# Patient Record
Sex: Female | Born: 1935
Health system: Southern US, Community
[De-identification: ages and names within clinical notes are randomized; demographics above are authoritative.]

## PROBLEM LIST (undated history)

## (undated) DIAGNOSIS — H9190 Unspecified hearing loss, unspecified ear: Secondary | ICD-10-CM

## (undated) DIAGNOSIS — K219 Gastro-esophageal reflux disease without esophagitis: Secondary | ICD-10-CM

## (undated) DIAGNOSIS — D649 Anemia, unspecified: Secondary | ICD-10-CM

## (undated) DIAGNOSIS — J45909 Unspecified asthma, uncomplicated: Secondary | ICD-10-CM

## (undated) DIAGNOSIS — E669 Obesity, unspecified: Secondary | ICD-10-CM

## (undated) DIAGNOSIS — I4891 Unspecified atrial fibrillation: Secondary | ICD-10-CM

## (undated) DIAGNOSIS — K635 Polyp of colon: Secondary | ICD-10-CM

## (undated) DIAGNOSIS — Z950 Presence of cardiac pacemaker: Secondary | ICD-10-CM

## (undated) DIAGNOSIS — I10 Essential (primary) hypertension: Secondary | ICD-10-CM

## (undated) DIAGNOSIS — E119 Type 2 diabetes mellitus without complications: Secondary | ICD-10-CM

## (undated) DIAGNOSIS — I499 Cardiac arrhythmia, unspecified: Secondary | ICD-10-CM

## (undated) DIAGNOSIS — I058 Other rheumatic mitral valve diseases: Secondary | ICD-10-CM

## (undated) DIAGNOSIS — E785 Hyperlipidemia, unspecified: Secondary | ICD-10-CM

## (undated) HISTORY — DX: Hyperlipidemia, unspecified: E78.5

## (undated) HISTORY — PX: OTHER SURGICAL HISTORY: SHX169

## (undated) HISTORY — PX: TONSILLECTOMY: SUR1361

## (undated) HISTORY — DX: Unspecified atrial fibrillation: I48.91

## (undated) HISTORY — DX: Essential (primary) hypertension: I10

## (undated) HISTORY — DX: Type 2 diabetes mellitus without complications: E11.9

## (undated) HISTORY — PX: APPENDECTOMY: SHX54

## (undated) HISTORY — PX: ABDOMINAL HYSTERECTOMY: SHX81

## (undated) HISTORY — DX: Polyp of colon: K63.5

## (undated) HISTORY — DX: Anemia, unspecified: D64.9

## (undated) HISTORY — DX: Obesity, unspecified: E66.9

## (undated) HISTORY — PX: COLONOSCOPY: SHX174

## (undated) HISTORY — PX: INSERT / REPLACE / REMOVE PACEMAKER: SUR710

## (undated) HISTORY — DX: Other rheumatic mitral valve diseases: I05.8

---

## 1999-12-08 LAB — HM PAP SMEAR: HM PAP: NORMAL

## 2003-02-17 DIAGNOSIS — K635 Polyp of colon: Secondary | ICD-10-CM

## 2003-02-17 DIAGNOSIS — E119 Type 2 diabetes mellitus without complications: Secondary | ICD-10-CM

## 2003-02-17 HISTORY — DX: Polyp of colon: K63.5

## 2003-02-17 HISTORY — DX: Type 2 diabetes mellitus without complications: E11.9

## 2004-02-17 HISTORY — PX: FLEXIBLE SIGMOIDOSCOPY: SHX1649

## 2004-03-31 ENCOUNTER — Ambulatory Visit: Payer: Self-pay | Admitting: Family Medicine

## 2004-04-16 ENCOUNTER — Ambulatory Visit: Payer: Self-pay | Admitting: Family Medicine

## 2004-12-23 ENCOUNTER — Ambulatory Visit: Payer: Self-pay | Admitting: Family Medicine

## 2006-02-04 ENCOUNTER — Ambulatory Visit: Payer: Self-pay | Admitting: Family Medicine

## 2006-02-16 HISTORY — PX: PACEMAKER PLACEMENT: SHX43

## 2006-02-16 HISTORY — PX: CHOLECYSTECTOMY: SHX55

## 2006-07-27 ENCOUNTER — Ambulatory Visit: Payer: Self-pay | Admitting: Orthopedic Surgery

## 2006-08-03 ENCOUNTER — Ambulatory Visit: Payer: Self-pay | Admitting: Orthopedic Surgery

## 2007-02-13 ENCOUNTER — Other Ambulatory Visit: Payer: Self-pay

## 2007-02-14 ENCOUNTER — Other Ambulatory Visit: Payer: Self-pay

## 2007-02-14 ENCOUNTER — Inpatient Hospital Stay: Payer: Self-pay | Admitting: General Surgery

## 2007-04-05 ENCOUNTER — Ambulatory Visit: Payer: Self-pay | Admitting: Family Medicine

## 2007-08-30 ENCOUNTER — Ambulatory Visit: Payer: Self-pay | Admitting: General Surgery

## 2008-05-30 ENCOUNTER — Ambulatory Visit: Payer: Self-pay | Admitting: Family Medicine

## 2009-07-09 ENCOUNTER — Ambulatory Visit: Payer: Self-pay | Admitting: Family Medicine

## 2010-11-17 ENCOUNTER — Ambulatory Visit: Payer: Self-pay | Admitting: Family Medicine

## 2011-05-19 DIAGNOSIS — R9431 Abnormal electrocardiogram [ECG] [EKG]: Secondary | ICD-10-CM | POA: Diagnosis not present

## 2011-05-19 DIAGNOSIS — E119 Type 2 diabetes mellitus without complications: Secondary | ICD-10-CM | POA: Diagnosis not present

## 2011-05-19 DIAGNOSIS — E785 Hyperlipidemia, unspecified: Secondary | ICD-10-CM | POA: Diagnosis not present

## 2011-05-19 DIAGNOSIS — I10 Essential (primary) hypertension: Secondary | ICD-10-CM | POA: Diagnosis not present

## 2011-06-30 DIAGNOSIS — E119 Type 2 diabetes mellitus without complications: Secondary | ICD-10-CM | POA: Diagnosis not present

## 2011-06-30 DIAGNOSIS — I4891 Unspecified atrial fibrillation: Secondary | ICD-10-CM | POA: Diagnosis not present

## 2011-06-30 DIAGNOSIS — I495 Sick sinus syndrome: Secondary | ICD-10-CM | POA: Diagnosis not present

## 2011-07-09 DIAGNOSIS — L570 Actinic keratosis: Secondary | ICD-10-CM | POA: Diagnosis not present

## 2011-07-09 DIAGNOSIS — L821 Other seborrheic keratosis: Secondary | ICD-10-CM | POA: Diagnosis not present

## 2011-07-27 DIAGNOSIS — I4891 Unspecified atrial fibrillation: Secondary | ICD-10-CM | POA: Diagnosis not present

## 2011-07-27 DIAGNOSIS — E119 Type 2 diabetes mellitus without complications: Secondary | ICD-10-CM | POA: Diagnosis not present

## 2011-07-27 DIAGNOSIS — I10 Essential (primary) hypertension: Secondary | ICD-10-CM | POA: Diagnosis not present

## 2011-07-27 DIAGNOSIS — Z950 Presence of cardiac pacemaker: Secondary | ICD-10-CM | POA: Diagnosis not present

## 2011-09-17 DIAGNOSIS — R9431 Abnormal electrocardiogram [ECG] [EKG]: Secondary | ICD-10-CM | POA: Diagnosis not present

## 2011-09-17 DIAGNOSIS — E785 Hyperlipidemia, unspecified: Secondary | ICD-10-CM | POA: Diagnosis not present

## 2011-09-17 DIAGNOSIS — I4891 Unspecified atrial fibrillation: Secondary | ICD-10-CM | POA: Diagnosis not present

## 2011-09-17 DIAGNOSIS — E119 Type 2 diabetes mellitus without complications: Secondary | ICD-10-CM | POA: Diagnosis not present

## 2011-09-22 LAB — HM DEXA SCAN: HM Dexa Scan: NORMAL

## 2011-10-27 DIAGNOSIS — M810 Age-related osteoporosis without current pathological fracture: Secondary | ICD-10-CM | POA: Diagnosis not present

## 2011-11-17 DIAGNOSIS — Z23 Encounter for immunization: Secondary | ICD-10-CM | POA: Diagnosis not present

## 2011-12-28 DIAGNOSIS — R0602 Shortness of breath: Secondary | ICD-10-CM | POA: Diagnosis not present

## 2011-12-28 DIAGNOSIS — I059 Rheumatic mitral valve disease, unspecified: Secondary | ICD-10-CM | POA: Diagnosis not present

## 2011-12-28 DIAGNOSIS — I119 Hypertensive heart disease without heart failure: Secondary | ICD-10-CM | POA: Diagnosis not present

## 2011-12-28 DIAGNOSIS — I4891 Unspecified atrial fibrillation: Secondary | ICD-10-CM | POA: Diagnosis not present

## 2012-01-05 DIAGNOSIS — E785 Hyperlipidemia, unspecified: Secondary | ICD-10-CM | POA: Diagnosis not present

## 2012-01-05 DIAGNOSIS — Z0000 Encounter for general adult medical examination without abnormal findings: Secondary | ICD-10-CM | POA: Diagnosis not present

## 2012-01-05 DIAGNOSIS — R9431 Abnormal electrocardiogram [ECG] [EKG]: Secondary | ICD-10-CM | POA: Diagnosis not present

## 2012-01-05 DIAGNOSIS — Z1339 Encounter for screening examination for other mental health and behavioral disorders: Secondary | ICD-10-CM | POA: Diagnosis not present

## 2012-01-07 DIAGNOSIS — D1801 Hemangioma of skin and subcutaneous tissue: Secondary | ICD-10-CM | POA: Diagnosis not present

## 2012-01-07 DIAGNOSIS — L821 Other seborrheic keratosis: Secondary | ICD-10-CM | POA: Diagnosis not present

## 2012-01-11 DIAGNOSIS — I4891 Unspecified atrial fibrillation: Secondary | ICD-10-CM | POA: Diagnosis not present

## 2012-01-11 DIAGNOSIS — R0602 Shortness of breath: Secondary | ICD-10-CM | POA: Diagnosis not present

## 2012-02-08 DIAGNOSIS — D649 Anemia, unspecified: Secondary | ICD-10-CM | POA: Diagnosis not present

## 2012-02-08 DIAGNOSIS — I10 Essential (primary) hypertension: Secondary | ICD-10-CM | POA: Diagnosis not present

## 2012-02-08 DIAGNOSIS — E7800 Pure hypercholesterolemia, unspecified: Secondary | ICD-10-CM | POA: Diagnosis not present

## 2012-02-08 DIAGNOSIS — E119 Type 2 diabetes mellitus without complications: Secondary | ICD-10-CM | POA: Diagnosis not present

## 2012-02-15 DIAGNOSIS — R5383 Other fatigue: Secondary | ICD-10-CM | POA: Diagnosis not present

## 2012-02-15 DIAGNOSIS — E785 Hyperlipidemia, unspecified: Secondary | ICD-10-CM | POA: Diagnosis not present

## 2012-02-15 DIAGNOSIS — E119 Type 2 diabetes mellitus without complications: Secondary | ICD-10-CM | POA: Diagnosis not present

## 2012-02-15 DIAGNOSIS — I10 Essential (primary) hypertension: Secondary | ICD-10-CM | POA: Diagnosis not present

## 2012-02-24 ENCOUNTER — Ambulatory Visit: Payer: Self-pay | Admitting: Family Medicine

## 2012-02-24 DIAGNOSIS — Z1231 Encounter for screening mammogram for malignant neoplasm of breast: Secondary | ICD-10-CM | POA: Diagnosis not present

## 2012-02-24 DIAGNOSIS — R928 Other abnormal and inconclusive findings on diagnostic imaging of breast: Secondary | ICD-10-CM | POA: Diagnosis not present

## 2012-06-29 DIAGNOSIS — I10 Essential (primary) hypertension: Secondary | ICD-10-CM | POA: Diagnosis not present

## 2012-06-29 DIAGNOSIS — Z950 Presence of cardiac pacemaker: Secondary | ICD-10-CM | POA: Diagnosis not present

## 2012-06-29 DIAGNOSIS — I4891 Unspecified atrial fibrillation: Secondary | ICD-10-CM | POA: Diagnosis not present

## 2012-06-29 DIAGNOSIS — E785 Hyperlipidemia, unspecified: Secondary | ICD-10-CM | POA: Diagnosis not present

## 2012-07-14 DIAGNOSIS — I495 Sick sinus syndrome: Secondary | ICD-10-CM | POA: Diagnosis not present

## 2012-08-08 DIAGNOSIS — E119 Type 2 diabetes mellitus without complications: Secondary | ICD-10-CM | POA: Diagnosis not present

## 2012-08-08 DIAGNOSIS — I10 Essential (primary) hypertension: Secondary | ICD-10-CM | POA: Diagnosis not present

## 2012-08-08 DIAGNOSIS — I4891 Unspecified atrial fibrillation: Secondary | ICD-10-CM | POA: Diagnosis not present

## 2012-08-08 DIAGNOSIS — E7800 Pure hypercholesterolemia, unspecified: Secondary | ICD-10-CM | POA: Diagnosis not present

## 2012-08-22 ENCOUNTER — Encounter: Payer: Self-pay | Admitting: *Deleted

## 2012-08-29 ENCOUNTER — Ambulatory Visit (INDEPENDENT_AMBULATORY_CARE_PROVIDER_SITE_OTHER): Payer: Medicare Other | Admitting: General Surgery

## 2012-08-29 ENCOUNTER — Encounter: Payer: Self-pay | Admitting: General Surgery

## 2012-08-29 VITALS — BP 132/72 | HR 80 | Resp 14 | Ht 63.0 in | Wt 175.0 lb

## 2012-08-29 DIAGNOSIS — K635 Polyp of colon: Secondary | ICD-10-CM | POA: Insufficient documentation

## 2012-08-29 DIAGNOSIS — Z860100 Personal history of colon polyps, unspecified: Secondary | ICD-10-CM | POA: Insufficient documentation

## 2012-08-29 NOTE — Progress Notes (Signed)
Patient ID: Taylor Snyder, female   DOB: 1928-02-25, 79 y.o.   MRN: [id number]  Chief Complaint  Patient presents with  . Other    colonoscopy    HPI Taylor Snyder is a 79 y.o. female who presents for a colonoscopy. The patient has a history of colon polyps. The most recent colonoscopy was done in 2005. No current problems with the bowel at this time. HPI  Past Medical History  Diagnosis Date  . Diabetes mellitus without complication 2005  . Colon polyps 2005  . A-fib     Past Surgical History  Procedure Laterality Date  . Flexible sigmoidoscopy  2006  . Cholecystectomy  2008  . Colonoscopy  2005  . Abdominal hysterectomy    . Salpingo oophorectmy     . Tonsillectomy    . Pacemaker placement  2008    History reviewed. No pertinent family history.  Social History History  Substance Use Topics  . Smoking status: Never Smoker   . Smokeless tobacco: Not on file  . Alcohol Use: No    Allergies  Allergen Reactions  . Codeine Other (See Comments)    Unable to sleep, hypes her up    Current Outpatient Prescriptions  Medication Sig Dispense Refill  . Calcium-Vitamin D (CALTRATE 600 PLUS-VIT D PO) Take 1 tablet by mouth daily.      . enalapril (VASOTEC) 5 MG tablet Take 5 mg by mouth 2 (two) times daily.      . fluticasone (FLOVENT DISKUS) 50 MCG/BLIST diskus inhaler Inhale 1 puff into the lungs daily.      . metFORMIN (GLUCOPHAGE) 500 MG tablet Take 500 mg by mouth 2 (two) times daily with a meal.      . mometasone-formoterol (DULERA) 200-5 MCG/ACT AERO Inhale 2 puffs into the lungs daily as needed for wheezing.      . Multiple Vitamin (MULTIVITAMIN) tablet Take 1 tablet by mouth daily.      . pravastatin (PRAVACHOL) 40 MG tablet Take 40 mg by mouth daily.      . raloxifene (EVISTA) 60 MG tablet Take 60 mg by mouth daily.      . Rivaroxaban (XARELTO) 20 MG TABS Take 20 mg by mouth daily.       No current facility-administered medications for this visit.    Review of  Systems Review of Systems  Constitutional: Negative.   Respiratory: Negative.   Cardiovascular: Negative.   Gastrointestinal: Negative.     Blood pressure 132/72, pulse 80, resp. rate 14, height 5\' 3"  (1.6 m), weight 175 lb (79.379 kg).  Physical Exam Physical Exam  Constitutional: She is oriented to person, place, and time. She appears well-developed and well-nourished.  Cardiovascular: An irregular rhythm present.  No murmur heard. Pulmonary/Chest: Effort normal and breath sounds normal.  Neurological: She is alert and oriented to person, place, and time.  Skin: Skin is warm and dry.    Data Reviewed Colonoscopy dated August 07, 2003 showed 212 mm polyps in the rectum.  Pathology showed a tubular adenoma in the mid rectal polyp and a tubulovillous adenoma with focal severe dysplasia in the lower rectal lesion.  Multiple small mouth diverticula were identified in the descending colon.  Assessment    Previous colonic polyps, one with severe dysplasia.     Plan    The indications for repeat examination has been discussed. She will need to hold her Xarelto prior to the procedure.     Patient will be contacted to arrange colonoscopy once September  2014 schedule is available. This patient has been asked to discontinue Xarelto two days prior to procedure. She has also been asked to hold metformin day of colonoscopy prep and procedure. Miralax prescription will be sent to patient's pharmacy once date is arranged.   Please note: patient is requesting colonoscopy be completed on a Wednesday morning since she is diabetic.   Taylor Snyder 08/30/2012, 9:30 PM

## 2012-08-29 NOTE — Patient Instructions (Addendum)
Colonoscopy A colonoscopy is an exam to evaluate your entire colon. In this exam, your colon is cleansed. A long fiberoptic tube is inserted through your rectum and into your colon. The fiberoptic scope (endoscope) is a long bundle of enclosed and very flexible fibers. These fibers transmit light to the area examined and send images from that area to your caregiver. Discomfort is usually minimal. You may be given a drug to help you sleep (sedative) during or prior to the procedure. This exam helps to detect lumps (tumors), polyps, inflammation, and areas of bleeding. Your caregiver may also take a small piece of tissue (biopsy) that will be examined under a microscope. LET YOUR CAREGIVER KNOW ABOUT:   Allergies to food or medicine.  Medicines taken, including vitamins, herbs, eyedrops, over-the-counter medicines, and creams.  Use of steroids (by mouth or creams).  Previous problems with anesthetics or numbing medicines.  History of bleeding problems or blood clots.  Previous surgery.  Other health problems, including diabetes and kidney problems.  Possibility of pregnancy, if this applies. BEFORE THE PROCEDURE   A clear liquid diet may be required for 2 days before the exam.  Ask your caregiver about changing or stopping your regular medications.  Liquid injections (enemas) or laxatives may be required.  A large amount of electrolyte solution may be given to you to drink over a short period of time. This solution is used to clean out your colon.  You should be present 60 minutes prior to your procedure or as directed by your caregiver. AFTER THE PROCEDURE   If you received a sedative or pain relieving medication, you will need to arrange for someone to drive you home.  Occasionally, there is a little blood passed with the first bowel movement. Do not be concerned. FINDING OUT THE RESULTS OF YOUR TEST Not all test results are available during your visit. If your test results are  not back during the visit, make an appointment with your caregiver to find out the results. Do not assume everything is normal if you have not heard from your caregiver or the medical facility. It is important for you to follow up on all of your test results. HOME CARE INSTRUCTIONS   It is not unusual to pass moderate amounts of gas and experience mild abdominal cramping following the procedure. This is due to air being used to inflate your colon during the exam. Walking or a warm pack on your belly (abdomen) may help.  You may resume all normal meals and activities after sedatives and medicines have worn off.  Only take over-the-counter or prescription medicines for pain, discomfort, or fever as directed by your caregiver. Do not use aspirin or blood thinners if a biopsy was taken. Consult your caregiver for medicine usage if biopsies were taken. SEEK IMMEDIATE MEDICAL CARE IF:   You have a fever.  You pass large blood clots or fill a toilet with blood following the procedure. This may also occur 10 to 14 days following the procedure. This is more likely if a biopsy was taken.  You develop abdominal pain that keeps getting worse and cannot be relieved with medicine. Document Released: 01/31/2000 Document Revised: 04/27/2011 Document Reviewed: 09/15/2007 [facility] Patient Information 2014 [place], [place].  Patient will be contacted to arrange colonoscopy once September 2014 schedule is available. This patient has been asked to discontinue Xarelto two days prior to procedure. She has also been asked to hold metformin day of colonoscopy prep and procedure.

## 2012-08-30 ENCOUNTER — Encounter: Payer: Self-pay | Admitting: General Surgery

## 2012-09-15 ENCOUNTER — Telehealth: Payer: Self-pay | Admitting: *Deleted

## 2012-09-15 NOTE — Telephone Encounter (Signed)
Message left on home and cell numbers to call the office.   We need to arrange colonoscopy since September 2014 schedule is now available.

## 2012-09-20 ENCOUNTER — Telehealth: Payer: Self-pay | Admitting: *Deleted

## 2012-09-20 DIAGNOSIS — Z86010 Personal history of colonic polyps: Secondary | ICD-10-CM

## 2012-09-20 MED ORDER — POLYETHYLENE GLYCOL 3350 17 GM/SCOOP PO POWD: ORAL | Status: DC

## 2012-09-20 NOTE — Telephone Encounter (Signed)
Patient called back to arrange a colonoscopy at [facility] for 11-16-12. She was reminded to stop Xarelto 2 days prior and to hold Metformin day of prep and procedure.   Miralax prescription sent to patient's pharmacy.  This patient will be contacted prior to verify no medication changes.

## 2012-10-30 ENCOUNTER — Other Ambulatory Visit: Payer: Self-pay | Admitting: General Surgery

## 2012-10-30 DIAGNOSIS — Z86010 Personal history of colonic polyps: Secondary | ICD-10-CM

## 2012-11-09 ENCOUNTER — Telehealth: Payer: Self-pay | Admitting: *Deleted

## 2012-11-09 NOTE — Telephone Encounter (Signed)
Patient reports no medication changes since last office visit. She was reminded to discontinue Xarelto 2 days prior to colonoscopy and to hold metformin day of prep and procedure. Patient instructed to pre-register by Friday since she has not done so already. We will proceed with colonoscopy that is scheduled for 11-16-12 at [facility]. She will call the office if she has further questions.

## 2012-11-16 ENCOUNTER — Ambulatory Visit: Payer: Self-pay | Admitting: General Surgery

## 2012-11-16 DIAGNOSIS — Z79899 Other long term (current) drug therapy: Secondary | ICD-10-CM | POA: Diagnosis not present

## 2012-11-16 DIAGNOSIS — E119 Type 2 diabetes mellitus without complications: Secondary | ICD-10-CM | POA: Diagnosis not present

## 2012-11-16 DIAGNOSIS — D126 Benign neoplasm of colon, unspecified: Secondary | ICD-10-CM | POA: Diagnosis not present

## 2012-11-16 DIAGNOSIS — Z860100 Personal history of colon polyps, unspecified: Secondary | ICD-10-CM | POA: Diagnosis not present

## 2012-11-16 DIAGNOSIS — K5730 Diverticulosis of large intestine without perforation or abscess without bleeding: Secondary | ICD-10-CM | POA: Diagnosis not present

## 2012-11-16 DIAGNOSIS — Z885 Allergy status to narcotic agent status: Secondary | ICD-10-CM | POA: Diagnosis not present

## 2012-11-16 DIAGNOSIS — Z950 Presence of cardiac pacemaker: Secondary | ICD-10-CM | POA: Diagnosis not present

## 2012-11-16 DIAGNOSIS — Z7901 Long term (current) use of anticoagulants: Secondary | ICD-10-CM | POA: Diagnosis not present

## 2012-11-16 DIAGNOSIS — D128 Benign neoplasm of rectum: Secondary | ICD-10-CM | POA: Diagnosis not present

## 2012-11-16 DIAGNOSIS — D129 Benign neoplasm of anus and anal canal: Secondary | ICD-10-CM

## 2012-11-16 LAB — HM COLONOSCOPY

## 2012-11-17 ENCOUNTER — Encounter: Payer: Self-pay | Admitting: General Surgery

## 2012-11-18 ENCOUNTER — Encounter: Payer: Self-pay | Admitting: General Surgery

## 2012-11-18 ENCOUNTER — Telehealth: Payer: Self-pay | Admitting: *Deleted

## 2012-11-18 NOTE — Telephone Encounter (Signed)
Notified patient as instructed, patient pleased. Discussed follow-up appointments as needed. patient agrees

## 2012-11-18 NOTE — Telephone Encounter (Signed)
Message copied by Levada Schilling on Fri Nov 18, 2012  9:04 AM ------      Message from: [place], Merrily Pew      Created: Fri Nov 18, 2012  8:53 AM       Notify patient polyps removed at colonoscopy on Oct 1 were benign, no cancer.  Follow up as needed.  ------

## 2012-11-29 ENCOUNTER — Encounter: Payer: Self-pay | Admitting: General Surgery

## 2013-01-03 DIAGNOSIS — Z950 Presence of cardiac pacemaker: Secondary | ICD-10-CM | POA: Diagnosis not present

## 2013-01-03 DIAGNOSIS — I495 Sick sinus syndrome: Secondary | ICD-10-CM | POA: Diagnosis not present

## 2013-01-03 DIAGNOSIS — I10 Essential (primary) hypertension: Secondary | ICD-10-CM | POA: Diagnosis not present

## 2013-01-03 DIAGNOSIS — R42 Dizziness and giddiness: Secondary | ICD-10-CM | POA: Diagnosis not present

## 2013-01-03 DIAGNOSIS — E119 Type 2 diabetes mellitus without complications: Secondary | ICD-10-CM | POA: Diagnosis not present

## 2013-01-05 DIAGNOSIS — L821 Other seborrheic keratosis: Secondary | ICD-10-CM | POA: Diagnosis not present

## 2013-01-05 DIAGNOSIS — D1801 Hemangioma of skin and subcutaneous tissue: Secondary | ICD-10-CM | POA: Diagnosis not present

## 2013-01-09 DIAGNOSIS — Z1331 Encounter for screening for depression: Secondary | ICD-10-CM | POA: Diagnosis not present

## 2013-01-09 DIAGNOSIS — Z1330 Encounter for screening examination for mental health and behavioral disorders, unspecified: Secondary | ICD-10-CM | POA: Diagnosis not present

## 2013-01-09 DIAGNOSIS — E7800 Pure hypercholesterolemia, unspecified: Secondary | ICD-10-CM | POA: Diagnosis not present

## 2013-01-09 DIAGNOSIS — Z23 Encounter for immunization: Secondary | ICD-10-CM | POA: Diagnosis not present

## 2013-01-09 DIAGNOSIS — E119 Type 2 diabetes mellitus without complications: Secondary | ICD-10-CM | POA: Diagnosis not present

## 2013-01-09 DIAGNOSIS — I10 Essential (primary) hypertension: Secondary | ICD-10-CM | POA: Diagnosis not present

## 2013-01-09 DIAGNOSIS — R5381 Other malaise: Secondary | ICD-10-CM | POA: Diagnosis not present

## 2013-01-09 DIAGNOSIS — I4891 Unspecified atrial fibrillation: Secondary | ICD-10-CM | POA: Diagnosis not present

## 2013-01-13 DIAGNOSIS — R5381 Other malaise: Secondary | ICD-10-CM | POA: Diagnosis not present

## 2013-01-13 DIAGNOSIS — E785 Hyperlipidemia, unspecified: Secondary | ICD-10-CM | POA: Diagnosis not present

## 2013-01-13 DIAGNOSIS — I4891 Unspecified atrial fibrillation: Secondary | ICD-10-CM | POA: Diagnosis not present

## 2013-01-13 DIAGNOSIS — I10 Essential (primary) hypertension: Secondary | ICD-10-CM | POA: Diagnosis not present

## 2013-01-13 LAB — TSH: TSH: 2.97 u[IU]/mL (ref <=5.90)

## 2013-02-15 DIAGNOSIS — E119 Type 2 diabetes mellitus without complications: Secondary | ICD-10-CM | POA: Diagnosis not present

## 2013-04-19 DIAGNOSIS — R5383 Other fatigue: Secondary | ICD-10-CM | POA: Diagnosis not present

## 2013-04-19 DIAGNOSIS — I10 Essential (primary) hypertension: Secondary | ICD-10-CM | POA: Diagnosis not present

## 2013-04-19 DIAGNOSIS — Z1339 Encounter for screening examination for other mental health and behavioral disorders: Secondary | ICD-10-CM | POA: Diagnosis not present

## 2013-04-19 DIAGNOSIS — Z1331 Encounter for screening for depression: Secondary | ICD-10-CM | POA: Diagnosis not present

## 2013-04-19 DIAGNOSIS — E7800 Pure hypercholesterolemia, unspecified: Secondary | ICD-10-CM | POA: Diagnosis not present

## 2013-04-19 DIAGNOSIS — R5381 Other malaise: Secondary | ICD-10-CM | POA: Diagnosis not present

## 2013-04-19 DIAGNOSIS — Z23 Encounter for immunization: Secondary | ICD-10-CM | POA: Diagnosis not present

## 2013-04-19 DIAGNOSIS — Z0000 Encounter for general adult medical examination without abnormal findings: Secondary | ICD-10-CM | POA: Diagnosis not present

## 2013-04-19 DIAGNOSIS — Z1342 Encounter for screening for global developmental delays (milestones): Secondary | ICD-10-CM | POA: Diagnosis not present

## 2013-04-19 DIAGNOSIS — I4891 Unspecified atrial fibrillation: Secondary | ICD-10-CM | POA: Diagnosis not present

## 2013-04-19 DIAGNOSIS — Z1330 Encounter for screening examination for mental health and behavioral disorders, unspecified: Secondary | ICD-10-CM | POA: Diagnosis not present

## 2013-05-18 ENCOUNTER — Ambulatory Visit: Payer: Self-pay | Admitting: Family Medicine

## 2013-05-18 DIAGNOSIS — Z1231 Encounter for screening mammogram for malignant neoplasm of breast: Secondary | ICD-10-CM | POA: Diagnosis not present

## 2013-05-18 LAB — HM MAMMOGRAPHY: HM MAMMO: NEGATIVE

## 2013-06-07 DIAGNOSIS — R5383 Other fatigue: Secondary | ICD-10-CM | POA: Diagnosis not present

## 2013-06-07 DIAGNOSIS — R5381 Other malaise: Secondary | ICD-10-CM | POA: Diagnosis not present

## 2013-06-07 DIAGNOSIS — I4891 Unspecified atrial fibrillation: Secondary | ICD-10-CM | POA: Diagnosis not present

## 2013-06-07 DIAGNOSIS — R7309 Other abnormal glucose: Secondary | ICD-10-CM | POA: Diagnosis not present

## 2013-06-07 DIAGNOSIS — I10 Essential (primary) hypertension: Secondary | ICD-10-CM | POA: Diagnosis not present

## 2013-06-20 DIAGNOSIS — Z7901 Long term (current) use of anticoagulants: Secondary | ICD-10-CM | POA: Diagnosis not present

## 2013-06-20 DIAGNOSIS — I4891 Unspecified atrial fibrillation: Secondary | ICD-10-CM | POA: Diagnosis not present

## 2013-06-20 DIAGNOSIS — E785 Hyperlipidemia, unspecified: Secondary | ICD-10-CM | POA: Diagnosis not present

## 2013-06-20 DIAGNOSIS — I10 Essential (primary) hypertension: Secondary | ICD-10-CM | POA: Diagnosis not present

## 2013-07-19 DIAGNOSIS — E119 Type 2 diabetes mellitus without complications: Secondary | ICD-10-CM | POA: Diagnosis not present

## 2013-07-19 DIAGNOSIS — I10 Essential (primary) hypertension: Secondary | ICD-10-CM | POA: Diagnosis not present

## 2013-07-19 DIAGNOSIS — I4891 Unspecified atrial fibrillation: Secondary | ICD-10-CM | POA: Diagnosis not present

## 2013-07-19 DIAGNOSIS — Z23 Encounter for immunization: Secondary | ICD-10-CM | POA: Diagnosis not present

## 2013-07-19 DIAGNOSIS — E785 Hyperlipidemia, unspecified: Secondary | ICD-10-CM | POA: Diagnosis not present

## 2013-07-20 DIAGNOSIS — I10 Essential (primary) hypertension: Secondary | ICD-10-CM | POA: Diagnosis not present

## 2013-07-20 DIAGNOSIS — E119 Type 2 diabetes mellitus without complications: Secondary | ICD-10-CM | POA: Diagnosis not present

## 2013-07-20 DIAGNOSIS — E785 Hyperlipidemia, unspecified: Secondary | ICD-10-CM | POA: Diagnosis not present

## 2013-07-20 LAB — LIPID PANEL
CHOLESTEROL: 157 mg/dL (ref 0–200)
HDL: 68 mg/dL (ref 35–70)
LDL Cholesterol: 66 mg/dL
LDL/HDL RATIO: 1
Triglycerides: 11 mg/dL — AB (ref 40–160)

## 2013-07-20 LAB — BASIC METABOLIC PANEL
BUN: 16 mg/dL (ref 4–21)
Creatinine: 0.8 mg/dL (ref <=1.1)
GLUCOSE: 138 mg/dL
Potassium: 4.6 mmol/L (ref 3.4–5.3)
SODIUM: 137 mmol/L (ref 137–147)

## 2013-07-20 LAB — HEPATIC FUNCTION PANEL
ALT: 15 U/L (ref 7–35)
AST: 17 U/L (ref 13–35)
Alkaline Phosphatase: 74 U/L (ref 25–125)
BILIRUBIN, TOTAL: 0.4 mg/dL

## 2013-08-07 DIAGNOSIS — I2510 Atherosclerotic heart disease of native coronary artery without angina pectoris: Secondary | ICD-10-CM | POA: Diagnosis not present

## 2013-08-07 DIAGNOSIS — D649 Anemia, unspecified: Secondary | ICD-10-CM | POA: Diagnosis not present

## 2013-08-07 DIAGNOSIS — E119 Type 2 diabetes mellitus without complications: Secondary | ICD-10-CM | POA: Diagnosis not present

## 2013-08-07 DIAGNOSIS — Z23 Encounter for immunization: Secondary | ICD-10-CM | POA: Diagnosis not present

## 2013-08-07 DIAGNOSIS — E785 Hyperlipidemia, unspecified: Secondary | ICD-10-CM | POA: Diagnosis not present

## 2013-10-03 DIAGNOSIS — E785 Hyperlipidemia, unspecified: Secondary | ICD-10-CM | POA: Diagnosis not present

## 2013-10-03 DIAGNOSIS — Z0000 Encounter for general adult medical examination without abnormal findings: Secondary | ICD-10-CM | POA: Diagnosis not present

## 2013-10-03 DIAGNOSIS — I2510 Atherosclerotic heart disease of native coronary artery without angina pectoris: Secondary | ICD-10-CM | POA: Diagnosis not present

## 2013-10-03 DIAGNOSIS — Z23 Encounter for immunization: Secondary | ICD-10-CM | POA: Diagnosis not present

## 2013-10-03 DIAGNOSIS — D649 Anemia, unspecified: Secondary | ICD-10-CM | POA: Diagnosis not present

## 2013-10-17 ENCOUNTER — Ambulatory Visit (INDEPENDENT_AMBULATORY_CARE_PROVIDER_SITE_OTHER): Payer: Medicare Other | Admitting: General Surgery

## 2013-10-17 ENCOUNTER — Encounter: Payer: Self-pay | Admitting: General Surgery

## 2013-10-17 VITALS — BP 144/62 | HR 78 | Resp 16 | Ht 63.0 in | Wt 174.0 lb

## 2013-10-17 DIAGNOSIS — D509 Iron deficiency anemia, unspecified: Secondary | ICD-10-CM | POA: Diagnosis not present

## 2013-10-17 NOTE — Patient Instructions (Addendum)
Esophagogastroduodenoscopy Esophagogastroduodenoscopy (EGD) is a procedure to examine the lining of the esophagus, stomach, and first part of the small intestine (duodenum). A long, flexible, lighted tube with a camera attached (endoscope) is inserted down the throat to view these organs. This procedure is done to detect problems or abnormalities, such as inflammation, bleeding, ulcers, or growths, in order to treat them. The procedure lasts about 5-20 minutes. It is usually an outpatient procedure, but it may need to be performed in emergency cases in the hospital. LET YOUR CAREGIVER KNOW ABOUT:   Allergies to food or medicine.  All medicines you are taking, including vitamins, herbs, eyedrops, and over-the-counter medicines and creams.  Use of steroids (by mouth or creams).  Previous problems you or members of your family have had with the use of anesthetics.  Any blood disorders you have.  Previous surgeries you have had.  Other health problems you have.  Possibility of pregnancy, if this applies. RISKS AND COMPLICATIONS  Generally, EGD is a safe procedure. However, as with any procedure, complications can occur. Possible complications include:  Infection.  Bleeding.  Tearing (perforation) of the esophagus, stomach, or duodenum.  Difficulty breathing or not being able to breath.  Excessive sweating.  Spasms of the larynx.  Slowed heartbeat.  Low blood pressure. BEFORE THE PROCEDURE  Do not eat or drink anything for 6-8 hours before the procedure or as directed by your caregiver.  Ask your caregiver about changing or stopping your regular medicines.  If you wear dentures, be prepared to remove them before the procedure.  Arrange for someone to drive you home after the procedure. PROCEDURE   A vein will be accessed to give medicines and fluids. A medicine to relax you (sedative) and a pain reliever will be given through that access into the vein.  A numbing medicine  (local anesthetic) may be sprayed on your throat for comfort and to stop you from gagging or coughing.  A mouth guard may be placed in your mouth to protect your teeth and to keep you from biting on the endoscope.  You will be asked to lie on your left side.  The endoscope is inserted down your throat and into the esophagus, stomach, and duodenum.  Air is put through the endoscope to allow your caregiver to view the lining of your esophagus clearly.  The esophagus, stomach, and duodenum is then examined. During the exam, your caregiver may:  Remove tissue to be examined under a microscope (biopsy) for inflammation, infection, or other medical problems.  Remove growths.  Remove objects (foreign bodies) that are stuck.  Treat any bleeding with medicines or other devices that stop tissues from bleeding (hot cautery, clipping devices).  Widen (dilate) or stretch narrowed areas of the esophagus and stomach.  The endoscope will then be withdrawn. AFTER THE PROCEDURE  You will be taken to a recovery area to be monitored. You will be able to go home once you are stable and alert.  Do not eat or drink anything until the local anesthetic and numbing medicines have worn off. You may choke.  It is normal to feel bloated, have pain with swallowing, or have a sore throat for a short time. This will wear off.  Your caregiver should be able to discuss his or her findings with you. It will take longer to discuss the test results if any biopsies were taken. Document Released: 06/05/2004 Document Revised: 06/19/2013 Document Reviewed: 01/06/2012 [facility] Patient Information 2015 [place], [place]. This information is not  intended to replace advice given to you by your health care provider. Make sure you discuss any questions you have with your health care provider.  Patient is scheduled for an upper endoscopy at [facility] on 11/01/13. She is aware to pre register with the hospital at least two days prior.  She will hold her Xarelto 2 days prior. She will only take her Enalapril at 6 am with a small sip of water the morning of. Patient is aware of date and instructions.

## 2013-10-17 NOTE — Progress Notes (Signed)
APatient ID: Taylor Snyder, female   DOB: 25-Feb-1928, 80 y.o.   MRN: [id number]  Chief Complaint  Patient presents with  . Anemia    HPI Taylor Snyder is a 80 y.o. female.  Here today for evaluation of anemia. She stated on iron tablets about one month ago. Dr. Rosanna Randy wants her to have a EGD.  HPI  Past Medical History  Diagnosis Date  . Diabetes mellitus without complication 1093  . Colon polyps 2005  . A-fib   . Anemia     Past Surgical History  Procedure Laterality Date  . Flexible sigmoidoscopy  2006  . Cholecystectomy  2008  . Colonoscopy  2005, 2014    Dr Bary Castilla  . Abdominal hysterectomy    . Salpingo oophorectmy     . Tonsillectomy    . Pacemaker placement  2008    No family history on file.  Social History History  Substance Use Topics  . Smoking status: Never Smoker   . Smokeless tobacco: Not on file  . Alcohol Use: No    Allergies  Allergen Reactions  . Codeine Other (See Comments)    Unable to sleep, hypes her up    Current Outpatient Prescriptions  Medication Sig Dispense Refill  . Calcium-Vitamin D (CALTRATE 600 PLUS-VIT D PO) Take 1 tablet by mouth daily.      . enalapril (VASOTEC) 5 MG tablet Take 5 mg by mouth 2 (two) times daily.      . ferrous gluconate (FERGON) 324 MG tablet Take 324 mg by mouth daily with breakfast.      . fluticasone (VERAMYST) 27.5 MCG/SPRAY nasal spray Place 2 sprays into the nose daily.      . metFORMIN (GLUCOPHAGE) 500 MG tablet Take 500 mg by mouth 2 (two) times daily with a meal.      . mometasone-formoterol (DULERA) 200-5 MCG/ACT AERO Inhale 2 puffs into the lungs daily as needed for wheezing.      . Multiple Vitamin (MULTIVITAMIN) tablet Take 1 tablet by mouth daily.      Marland Kitchen omeprazole (PRILOSEC) 20 MG capsule Take 20 mg by mouth daily.      . pravastatin (PRAVACHOL) 40 MG tablet Take 40 mg by mouth daily.      . raloxifene (EVISTA) 60 MG tablet Take 60 mg by mouth daily.      . Rivaroxaban (XARELTO) 20 MG TABS  Take 20 mg by mouth daily.       No current facility-administered medications for this visit.    Review of Systems Review of Systems  Blood pressure 144/62, pulse 78, resp. rate 16, height 5\' 3"  (1.6 m), weight 174 lb (78.926 kg).  Physical Exam Physical Exam  Constitutional: She is oriented to person, place, and time. She appears well-developed and well-nourished.  Neck: Neck supple.  Cardiovascular: Normal rate.  An irregular rhythm present.  Pulmonary/Chest: Effort normal and breath sounds normal.  Abdominal: Soft.  Lymphadenopathy:    She has no cervical adenopathy.  Neurological: She is alert and oriented to person, place, and time.  Skin: Skin is warm and dry.    Data Reviewed Laboratories as after one month on iron supplementationShowed a hemoglobin of 11.1, MCV of 85. White blood cell count 5900 with normal differential, platelet count 195,000.  Assessment    Anemia.     Plan    The potential for an upper GI source for blood loss is present. Upper endoscopy is been requested by her primary care physician.  The risks associated with the procedure been reviewed.     Patient is scheduled for an upper endoscopy at [facility] on 11/01/13. She is aware to pre register with the hospital at least two days prior. She will hold her Xarelto 2 days prior. She will only take her Enalapril at 6 am with a small sip of water the morning of. Patient is aware of date and instructions.   PCP/Ref: Philemon Kingdom 10/18/2013, 8:15 PM

## 2013-10-18 ENCOUNTER — Other Ambulatory Visit: Payer: Self-pay | Admitting: General Surgery

## 2013-10-18 DIAGNOSIS — D509 Iron deficiency anemia, unspecified: Secondary | ICD-10-CM | POA: Insufficient documentation

## 2013-11-01 ENCOUNTER — Ambulatory Visit: Payer: Self-pay | Admitting: General Surgery

## 2013-11-01 DIAGNOSIS — Z79899 Other long term (current) drug therapy: Secondary | ICD-10-CM | POA: Diagnosis not present

## 2013-11-01 DIAGNOSIS — Z885 Allergy status to narcotic agent status: Secondary | ICD-10-CM | POA: Diagnosis not present

## 2013-11-01 DIAGNOSIS — Z950 Presence of cardiac pacemaker: Secondary | ICD-10-CM | POA: Diagnosis not present

## 2013-11-01 DIAGNOSIS — D509 Iron deficiency anemia, unspecified: Secondary | ICD-10-CM | POA: Diagnosis not present

## 2013-11-01 DIAGNOSIS — K449 Diaphragmatic hernia without obstruction or gangrene: Secondary | ICD-10-CM | POA: Diagnosis not present

## 2013-11-01 DIAGNOSIS — E119 Type 2 diabetes mellitus without complications: Secondary | ICD-10-CM | POA: Diagnosis not present

## 2013-11-01 DIAGNOSIS — I4891 Unspecified atrial fibrillation: Secondary | ICD-10-CM | POA: Diagnosis not present

## 2013-11-01 DIAGNOSIS — K2990 Gastroduodenitis, unspecified, without bleeding: Secondary | ICD-10-CM | POA: Diagnosis not present

## 2013-11-01 DIAGNOSIS — K2940 Chronic atrophic gastritis without bleeding: Secondary | ICD-10-CM | POA: Diagnosis not present

## 2013-11-01 DIAGNOSIS — D649 Anemia, unspecified: Secondary | ICD-10-CM | POA: Diagnosis not present

## 2013-11-01 DIAGNOSIS — Z7901 Long term (current) use of anticoagulants: Secondary | ICD-10-CM | POA: Diagnosis not present

## 2013-11-01 DIAGNOSIS — K2970 Gastritis, unspecified, without bleeding: Secondary | ICD-10-CM | POA: Diagnosis not present

## 2013-11-01 DIAGNOSIS — Z86010 Personal history of colonic polyps: Secondary | ICD-10-CM | POA: Diagnosis not present

## 2013-11-03 ENCOUNTER — Encounter: Payer: Self-pay | Admitting: General Surgery

## 2013-11-03 LAB — PATHOLOGY REPORT

## 2013-11-07 ENCOUNTER — Encounter: Payer: Self-pay | Admitting: General Surgery

## 2013-11-07 ENCOUNTER — Telehealth: Payer: Self-pay | Admitting: *Deleted

## 2013-11-07 NOTE — Telephone Encounter (Signed)
I called and spoke with Taylor Snyder as requested by Dr. Bary Castilla, to advise her that the stomach biopsies done on September 16th did not show any cancer or infection, just gastritis. He said that this should be adequately treated with her ongoing use of omeprazole. She verbalized her understanding and was appreciative of the call.

## 2013-11-07 NOTE — Telephone Encounter (Signed)
Message copied by Carson Myrtle on Tue Nov 07, 2013 10:22 AM ------      Message from: Robert Bellow      Created: Tue Nov 07, 2013  8:59 AM       Please notify the patient the biopsies obtained and September 16 from her stomach did not show any cancer or infection. Just gastritis which should beadequately treated with her ongoing use of omeprazole. ------

## 2013-11-07 NOTE — Telephone Encounter (Signed)
Completed by Jacques Navy.

## 2013-12-05 DIAGNOSIS — Z23 Encounter for immunization: Secondary | ICD-10-CM | POA: Diagnosis not present

## 2013-12-05 DIAGNOSIS — I10 Essential (primary) hypertension: Secondary | ICD-10-CM | POA: Diagnosis not present

## 2013-12-05 DIAGNOSIS — E119 Type 2 diabetes mellitus without complications: Secondary | ICD-10-CM | POA: Diagnosis not present

## 2013-12-05 DIAGNOSIS — D649 Anemia, unspecified: Secondary | ICD-10-CM | POA: Diagnosis not present

## 2013-12-28 DIAGNOSIS — I495 Sick sinus syndrome: Secondary | ICD-10-CM | POA: Diagnosis not present

## 2013-12-28 DIAGNOSIS — D649 Anemia, unspecified: Secondary | ICD-10-CM | POA: Insufficient documentation

## 2013-12-28 DIAGNOSIS — E782 Mixed hyperlipidemia: Secondary | ICD-10-CM | POA: Insufficient documentation

## 2013-12-28 DIAGNOSIS — I10 Essential (primary) hypertension: Secondary | ICD-10-CM | POA: Diagnosis not present

## 2013-12-28 DIAGNOSIS — I340 Nonrheumatic mitral (valve) insufficiency: Secondary | ICD-10-CM | POA: Insufficient documentation

## 2013-12-28 DIAGNOSIS — I480 Paroxysmal atrial fibrillation: Secondary | ICD-10-CM | POA: Diagnosis not present

## 2014-03-12 DIAGNOSIS — E119 Type 2 diabetes mellitus without complications: Secondary | ICD-10-CM | POA: Diagnosis not present

## 2014-04-03 DIAGNOSIS — Z23 Encounter for immunization: Secondary | ICD-10-CM | POA: Diagnosis not present

## 2014-04-03 DIAGNOSIS — I10 Essential (primary) hypertension: Secondary | ICD-10-CM | POA: Diagnosis not present

## 2014-04-03 DIAGNOSIS — D649 Anemia, unspecified: Secondary | ICD-10-CM | POA: Diagnosis not present

## 2014-04-03 DIAGNOSIS — E119 Type 2 diabetes mellitus without complications: Secondary | ICD-10-CM | POA: Diagnosis not present

## 2014-04-03 LAB — CBC AND DIFFERENTIAL
HCT: 38 % (ref 36–46)
HEMOGLOBIN: 12.9 g/dL (ref 12.0–16.0)
NEUTROS ABS: 56 /uL
Platelets: 192 10*3/uL (ref 150–399)
WBC: 7 10^3/mL

## 2014-04-03 LAB — HEMOGLOBIN A1C: Hgb A1c MFr Bld: 7.2 % — AB (ref 4.0–6.0)

## 2014-04-04 DIAGNOSIS — L578 Other skin changes due to chronic exposure to nonionizing radiation: Secondary | ICD-10-CM | POA: Diagnosis not present

## 2014-04-04 DIAGNOSIS — D225 Melanocytic nevi of trunk: Secondary | ICD-10-CM | POA: Diagnosis not present

## 2014-04-04 DIAGNOSIS — D2262 Melanocytic nevi of left upper limb, including shoulder: Secondary | ICD-10-CM | POA: Diagnosis not present

## 2014-04-04 DIAGNOSIS — D2271 Melanocytic nevi of right lower limb, including hip: Secondary | ICD-10-CM | POA: Diagnosis not present

## 2014-06-08 DIAGNOSIS — I10 Essential (primary) hypertension: Secondary | ICD-10-CM | POA: Insufficient documentation

## 2014-06-18 DIAGNOSIS — E669 Obesity, unspecified: Secondary | ICD-10-CM | POA: Insufficient documentation

## 2014-06-18 DIAGNOSIS — J45909 Unspecified asthma, uncomplicated: Secondary | ICD-10-CM | POA: Insufficient documentation

## 2014-06-18 DIAGNOSIS — J309 Allergic rhinitis, unspecified: Secondary | ICD-10-CM | POA: Insufficient documentation

## 2014-06-18 DIAGNOSIS — I059 Rheumatic mitral valve disease, unspecified: Secondary | ICD-10-CM | POA: Insufficient documentation

## 2014-06-18 DIAGNOSIS — E785 Hyperlipidemia, unspecified: Secondary | ICD-10-CM | POA: Insufficient documentation

## 2014-06-18 DIAGNOSIS — D649 Anemia, unspecified: Secondary | ICD-10-CM | POA: Insufficient documentation

## 2014-06-18 DIAGNOSIS — M1990 Unspecified osteoarthritis, unspecified site: Secondary | ICD-10-CM | POA: Insufficient documentation

## 2014-06-18 DIAGNOSIS — Z87898 Personal history of other specified conditions: Secondary | ICD-10-CM | POA: Insufficient documentation

## 2014-06-18 DIAGNOSIS — I4891 Unspecified atrial fibrillation: Secondary | ICD-10-CM | POA: Insufficient documentation

## 2014-06-18 DIAGNOSIS — I10 Essential (primary) hypertension: Secondary | ICD-10-CM | POA: Insufficient documentation

## 2014-06-18 DIAGNOSIS — M810 Age-related osteoporosis without current pathological fracture: Secondary | ICD-10-CM | POA: Insufficient documentation

## 2014-06-18 DIAGNOSIS — J449 Chronic obstructive pulmonary disease, unspecified: Secondary | ICD-10-CM | POA: Insufficient documentation

## 2014-06-18 DIAGNOSIS — E119 Type 2 diabetes mellitus without complications: Secondary | ICD-10-CM | POA: Insufficient documentation

## 2014-06-19 DIAGNOSIS — I495 Sick sinus syndrome: Secondary | ICD-10-CM | POA: Diagnosis not present

## 2014-06-21 DIAGNOSIS — I480 Paroxysmal atrial fibrillation: Secondary | ICD-10-CM | POA: Diagnosis not present

## 2014-06-21 DIAGNOSIS — I340 Nonrheumatic mitral (valve) insufficiency: Secondary | ICD-10-CM | POA: Diagnosis not present

## 2014-06-21 DIAGNOSIS — I495 Sick sinus syndrome: Secondary | ICD-10-CM | POA: Diagnosis not present

## 2014-06-21 DIAGNOSIS — E782 Mixed hyperlipidemia: Secondary | ICD-10-CM | POA: Diagnosis not present

## 2014-07-30 ENCOUNTER — Ambulatory Visit (INDEPENDENT_AMBULATORY_CARE_PROVIDER_SITE_OTHER): Payer: Medicare Other | Admitting: Family Medicine

## 2014-07-30 ENCOUNTER — Encounter: Payer: Self-pay | Admitting: Family Medicine

## 2014-07-30 VITALS — BP 142/60 | HR 76 | Temp 98.0°F | Resp 16 | Wt 179.0 lb

## 2014-07-30 DIAGNOSIS — E7800 Pure hypercholesterolemia, unspecified: Secondary | ICD-10-CM

## 2014-07-30 DIAGNOSIS — E119 Type 2 diabetes mellitus without complications: Secondary | ICD-10-CM

## 2014-07-30 DIAGNOSIS — D649 Anemia, unspecified: Secondary | ICD-10-CM

## 2014-07-30 DIAGNOSIS — I4891 Unspecified atrial fibrillation: Secondary | ICD-10-CM | POA: Diagnosis not present

## 2014-07-30 NOTE — Progress Notes (Signed)
Patient ID: Taylor Snyder, female   DOB: November 15, 1928, 81 y.o.   MRN: [id number]   SHEVON SIAN  MRN: [id number] DOB: [birth year]  Subjective:  Diabetes She presents for her follow-up diabetic visit. She has type 2 diabetes mellitus. There are no hypoglycemic associated symptoms. Pertinent negatives for hypoglycemia include no confusion or pallor. Pertinent negatives for diabetes include no blurred vision, no chest pain, no fatigue, no foot paresthesias, no foot ulcerations, no polydipsia, no polyphagia, no polyuria, no visual change, no weakness and no weight loss. Symptoms are stable. Her breakfast blood glucose range is generally 130-140 mg/dl. (Patient has had glucose readings from 130s-200, but mostly in the 150 range.)  Anemia Presents for follow-up visit. Symptoms include leg swelling (usually at the end of the day) and light-headedness (Only when she bends over and gets up too fast). There has been no abdominal pain, anorexia, bruising/bleeding easily, confusion, fever, malaise/fatigue, pallor, paresthesias or weight loss.    Patient Active Problem List   Diagnosis Date Noted  . A-fib 06/18/2014  . Allergic rhinitis 06/18/2014  . Airway hyperreactivity 06/18/2014  . Anemia due to unknown mechanism 06/18/2014  . CAFL (chronic airflow limitation) 06/18/2014  . Diabetes mellitus, type 2 06/18/2014  . HLD (hyperlipidemia) 06/18/2014  . BP (high blood pressure) 06/18/2014  . History of prolonged Q-T interval on ECG 06/18/2014  . Mitral valve disorder 06/18/2014  . Arthritis, degenerative 06/18/2014  . Adiposity 06/18/2014  . OP (osteoporosis) 06/18/2014  . Anemia, iron deficiency 10/18/2013  . Personal history of colonic polyps 08/29/2012  . Colon polyps     Past Medical History  Diagnosis Date  . Diabetes mellitus without complication 2671  . Colon polyps 2005  . A-fib   . Anemia     History   Social History  . Marital Status: Married    Spouse Name: N/A  . Number of  Children: N/A  . Years of Education: N/A   Occupational History  . Not on file.   Social History Main Topics  . Smoking status: Never Smoker   . Smokeless tobacco: Not on file  . Alcohol Use: No  . Drug Use: No  . Sexual Activity: Not on file   Other Topics Concern  . Not on file   Social History Narrative    Outpatient Prescriptions Prior to Visit  Medication Sig Dispense Refill  . Calcium Carbonate-Vitamin D 600-400 MG-UNIT per tablet Take by mouth.    . cetirizine (ZYRTEC) 10 MG tablet Take by mouth.    . enalapril (VASOTEC) 5 MG tablet Take by mouth.    . ferrous sulfate 325 (65 FE) MG tablet Take by mouth.    . fluticasone (VERAMYST) 27.5 MCG/SPRAY nasal spray Place 2 sprays into the nose daily.    Marland Kitchen glucose blood test strip ACCU-CHEK ACTIVE (In Vitro Strip)  1 (one) Strip check sugar once daily for 0 days  Quantity: 50;  Refills: 12   Ordered :26-Jan-2014  Miguel Aschoff MD;  Started 26-Jan-2014 Active Comments: DX: E11.9    . metFORMIN (GLUCOPHAGE) 500 MG tablet Take by mouth.    . Multiple Vitamins-Minerals (CENTRUM SILVER) tablet Take by mouth.    Marland Kitchen omeprazole (PRILOSEC) 20 MG capsule Take 20 mg by mouth daily.    . pravastatin (PRAVACHOL) 40 MG tablet Take by mouth.    . raloxifene (EVISTA) 60 MG tablet Take by mouth.    . rivaroxaban (XARELTO) 20 MG TABS tablet Take by mouth.    . Calcium-Vitamin  D (CALTRATE 600 PLUS-VIT D PO) Take 1 tablet by mouth daily.    . enalapril (VASOTEC) 5 MG tablet Take 5 mg by mouth 2 (two) times daily.    . ferrous gluconate (FERGON) 324 MG tablet Take 324 mg by mouth daily with breakfast.    . fluticasone (FLONASE) 50 MCG/ACT nasal spray Place into the nose.    . metFORMIN (GLUCOPHAGE) 500 MG tablet Take 500 mg by mouth 2 (two) times daily with a meal.    . mometasone-formoterol (DULERA) 200-5 MCG/ACT AERO Inhale 2 puffs into the lungs daily as needed for wheezing.    . mometasone-formoterol (DULERA) 200-5 MCG/ACT AERO Inhale into the  lungs.    . Multiple Vitamin (MULTIVITAMIN) tablet Take 1 tablet by mouth daily.    Marland Kitchen omeprazole (PRILOSEC) 20 MG capsule Take by mouth.    . pravastatin (PRAVACHOL) 40 MG tablet Take 40 mg by mouth daily.    . raloxifene (EVISTA) 60 MG tablet Take 60 mg by mouth daily.    . Rivaroxaban (XARELTO) 20 MG TABS Take 20 mg by mouth daily.     No facility-administered medications prior to visit.    Allergies  Allergen Reactions  . Codeine Other (See Comments)    Unable to sleep, hypes her up  . Codeine Sulfate Other (See Comments)    Review of Systems  Constitutional: Negative for fever, weight loss, malaise/fatigue and fatigue.  Eyes: Negative for blurred vision.  Respiratory: Negative.   Cardiovascular: Negative for chest pain.  Gastrointestinal: Negative for abdominal pain and anorexia.  Skin: Negative for pallor.  Neurological: Positive for light-headedness (Only when she bends over and gets up too fast). Negative for weakness and paresthesias.  Endo/Heme/Allergies: Negative for polydipsia and polyphagia. Does not bruise/bleed easily.  Psychiatric/Behavioral: Negative for confusion.   Objective:  BP 142/60 mmHg  Pulse 76  Temp(Src) 98 F (36.7 C) (Oral)  Resp 16  Wt 179 lb (81.194 kg)  Physical Exam  Constitutional: She is oriented to person, place, and time and well-developed, well-nourished, and in no distress.  HENT:  Head: Normocephalic and atraumatic.  Right Ear: External ear normal.  Left Ear: External ear normal.  Nose: Nose normal.  Eyes: Conjunctivae and EOM are normal. Pupils are equal, round, and reactive to light.  Neck: Normal range of motion. Neck supple.  Cardiovascular: Normal rate, regular rhythm and normal heart sounds.   Pulmonary/Chest: Effort normal and breath sounds normal.  Musculoskeletal: Normal range of motion.  Neurological: She is alert and oriented to person, place, and time. Gait normal.  Skin: Skin is warm and dry.  Psychiatric: Mood,  memory, affect and judgment normal.    Assessment and Plan :  Type 2 diabetes mellitus without complication  Anemia, unspecified anemia type  Atrial fibrillation, unspecified   Hypertnesion  Hyperlipidemia  Miguel Aschoff MD [facility] 07/30/2014 11:39 AM

## 2014-07-31 DIAGNOSIS — D649 Anemia, unspecified: Secondary | ICD-10-CM | POA: Diagnosis not present

## 2014-07-31 DIAGNOSIS — E119 Type 2 diabetes mellitus without complications: Secondary | ICD-10-CM | POA: Diagnosis not present

## 2014-07-31 DIAGNOSIS — E780 Pure hypercholesterolemia: Secondary | ICD-10-CM | POA: Diagnosis not present

## 2014-07-31 DIAGNOSIS — E785 Hyperlipidemia, unspecified: Secondary | ICD-10-CM | POA: Diagnosis not present

## 2014-08-01 ENCOUNTER — Telehealth: Payer: Self-pay

## 2014-08-01 LAB — CBC WITH DIFFERENTIAL/PLATELET
BASOS ABS: 0 10*3/uL (ref 0.0–0.2)
Basos: 1 %
EOS (ABSOLUTE): 0.1 10*3/uL (ref 0.0–0.4)
Eos: 2 %
Hematocrit: 37.3 % (ref 34.0–46.6)
Hemoglobin: 12.6 g/dL (ref 11.1–15.9)
IMMATURE GRANS (ABS): 0 10*3/uL (ref 0.0–0.1)
Immature Granulocytes: 0 %
Lymphocytes Absolute: 2.4 10*3/uL (ref 0.7–3.1)
Lymphs: 38 %
MCH: 31.4 pg (ref 26.6–33.0)
MCHC: 33.8 g/dL (ref 31.5–35.7)
MCV: 93 fL (ref 79–97)
Monocytes Absolute: 0.5 10*3/uL (ref 0.1–0.9)
Monocytes: 8 %
Neutrophils Absolute: 3.3 10*3/uL (ref 1.4–7.0)
Neutrophils: 51 %
Platelets: 183 10*3/uL (ref 150–379)
RBC: 4.01 x10E6/uL (ref 3.77–5.28)
RDW: 13.7 % (ref 12.3–15.4)
WBC: 6.4 10*3/uL (ref 3.4–10.8)

## 2014-08-01 LAB — CMP14+EGFR
ALT: 14 IU/L (ref 0–32)
AST: 13 IU/L (ref 0–40)
Albumin/Globulin Ratio: 1.9 (ref 1.1–2.5)
Albumin: 3.8 g/dL (ref 3.5–4.7)
Alkaline Phosphatase: 76 IU/L (ref 39–117)
BUN / CREAT RATIO: 17 (ref 11–26)
BUN: 11 mg/dL (ref 8–27)
Bilirubin Total: 0.3 mg/dL (ref 0.0–1.2)
CO2: 22 mmol/L (ref 18–29)
CREATININE: 0.64 mg/dL (ref 0.57–1.00)
Calcium: 8.6 mg/dL — ABNORMAL LOW (ref 8.7–10.3)
Chloride: 99 mmol/L (ref 97–108)
GFR calc Af Amer: 95 mL/min/{1.73_m2} (ref >59)
GFR, EST NON AFRICAN AMERICAN: 82 mL/min/{1.73_m2} (ref >59)
GLOBULIN, TOTAL: 2 g/dL (ref 1.5–4.5)
Glucose: 147 mg/dL — ABNORMAL HIGH (ref 65–99)
Potassium: 4.5 mmol/L (ref 3.5–5.2)
SODIUM: 139 mmol/L (ref 134–144)
Total Protein: 5.8 g/dL — ABNORMAL LOW (ref 6.0–8.5)

## 2014-08-01 LAB — HDL CHOLESTEROL: HDL: 57 mg/dL (ref >39)

## 2014-08-01 LAB — HEMOGLOBIN A1C
ESTIMATED AVERAGE GLUCOSE: 171 mg/dL
HEMOGLOBIN A1C: 7.6 % — AB (ref 4.8–5.6)

## 2014-08-01 NOTE — Telephone Encounter (Signed)
[place] to add/change order. Lipid with HDL, LDL ratio.

## 2014-08-02 LAB — LIPID PANEL WITH LDL/HDL RATIO
Cholesterol, Total: 147 mg/dL (ref 100–199)
HDL: 57 mg/dL (ref >39)
LDL CALC: 58 mg/dL (ref 0–99)
LDL/HDL RATIO: 1 ratio (ref 0.0–3.2)
Triglycerides: 161 mg/dL — ABNORMAL HIGH (ref 0–149)
VLDL CHOLESTEROL CAL: 32 mg/dL (ref 5–40)

## 2014-08-02 LAB — SPECIMEN STATUS REPORT

## 2014-08-06 NOTE — Telephone Encounter (Signed)
-----   Message from Jerrol Banana., MD sent at 08/05/2014  4:15 PM EDT ----- Diabetes and lipids stable and controlled

## 2014-08-06 NOTE — Telephone Encounter (Signed)
LMTCB ED 

## 2014-08-07 NOTE — Telephone Encounter (Signed)
Patient advised  ED 

## 2014-08-08 ENCOUNTER — Other Ambulatory Visit: Payer: Self-pay | Admitting: Family Medicine

## 2014-08-08 ENCOUNTER — Other Ambulatory Visit: Payer: Self-pay

## 2014-08-08 DIAGNOSIS — I10 Essential (primary) hypertension: Secondary | ICD-10-CM

## 2014-08-08 MED ORDER — ENALAPRIL MALEATE 5 MG PO TABS: 5.0000 mg | ORAL_TABLET | Freq: Every day | ORAL | Status: DC

## 2014-08-15 ENCOUNTER — Other Ambulatory Visit: Payer: Self-pay | Admitting: Family Medicine

## 2014-08-15 DIAGNOSIS — Z1231 Encounter for screening mammogram for malignant neoplasm of breast: Secondary | ICD-10-CM

## 2014-08-17 ENCOUNTER — Ambulatory Visit
Admission: RE | Admit: 2014-08-17 | Discharge: 2014-08-17 | Disposition: A | Discharge status: Home / Self Care | Payer: Medicare Other | Source: Ambulatory Visit | Attending: Family Medicine | Admitting: Family Medicine

## 2014-08-17 ENCOUNTER — Other Ambulatory Visit: Payer: Self-pay | Admitting: Family Medicine

## 2014-08-17 DIAGNOSIS — Z1231 Encounter for screening mammogram for malignant neoplasm of breast: Secondary | ICD-10-CM | POA: Diagnosis not present

## 2014-09-08 ENCOUNTER — Other Ambulatory Visit: Payer: Self-pay | Admitting: Family Medicine

## 2014-10-23 ENCOUNTER — Other Ambulatory Visit: Payer: Self-pay | Admitting: Family Medicine

## 2014-10-31 ENCOUNTER — Ambulatory Visit (INDEPENDENT_AMBULATORY_CARE_PROVIDER_SITE_OTHER): Payer: Medicare Other | Admitting: Family Medicine

## 2014-10-31 ENCOUNTER — Encounter: Payer: Self-pay | Admitting: Family Medicine

## 2014-10-31 VITALS — BP 142/64 | HR 72 | Temp 98.2°F | Resp 10 | Ht 62.0 in | Wt 175.0 lb

## 2014-10-31 DIAGNOSIS — M6530 Trigger finger, unspecified finger: Secondary | ICD-10-CM

## 2014-10-31 DIAGNOSIS — M25511 Pain in right shoulder: Secondary | ICD-10-CM

## 2014-10-31 DIAGNOSIS — Z23 Encounter for immunization: Secondary | ICD-10-CM

## 2014-10-31 DIAGNOSIS — M25512 Pain in left shoulder: Secondary | ICD-10-CM | POA: Diagnosis not present

## 2014-10-31 DIAGNOSIS — Z0000 Encounter for general adult medical examination without abnormal findings: Secondary | ICD-10-CM

## 2014-10-31 NOTE — Progress Notes (Signed)
Patient ID: Taylor Snyder, female   DOB: 14-May-1928, 81 y.o.   MRN: [id number] Visit Date: 10/31/2014  Today's Provider: [name], MD   Chief Complaint  Patient presents with  . Medicare Wellness   Subjective:   Taylor Snyder is a 81 y.o. female who presents today for her Subsequent Annual Wellness Visit. She feels fairly well. She reports exercising stays active. She reports she is sleeping well.  LAST: Mammogram 08/17/2014  Colonoscopy 11/16/12 polyps  BMD 10/27/11 normal  EKG 02/19/09  Endoscopy 11/01/13 moderate chronic active gastritis  Pap smear 12/08/99    Review of Systems  Constitutional: Negative.   HENT: Positive for hearing loss.   Eyes: Negative.   Respiratory: Negative.   Cardiovascular: Negative.   Gastrointestinal: Negative.   Endocrine: Negative.   Genitourinary: Negative.   Musculoskeletal: Positive for arthralgias.  Skin: Negative.   Allergic/Immunologic: Negative.   Neurological: Negative.   Hematological: Negative.   Psychiatric/Behavioral: Negative.     Patient Active Problem List   Diagnosis Date Noted  . A-fib 06/18/2014  . Allergic rhinitis 06/18/2014  . Airway hyperreactivity 06/18/2014  . Anemia due to unknown mechanism 06/18/2014  . CAFL (chronic airflow limitation) 06/18/2014  . Diabetes mellitus, type 2 06/18/2014  . HLD (hyperlipidemia) 06/18/2014  . BP (high blood pressure) 06/18/2014  . History of prolonged Q-T interval on ECG 06/18/2014  . Mitral valve disorder 06/18/2014  . Arthritis, degenerative 06/18/2014  . Adiposity 06/18/2014  . OP (osteoporosis) 06/18/2014  . Benign essential HTN 06/08/2014  . Combined fat and carbohydrate induced hyperlipemia 12/28/2013  . MI (mitral incompetence) 12/28/2013  . Sick sinus syndrome 12/28/2013  . Anemia, iron deficiency 10/18/2013  . Personal history of colonic polyps 08/29/2012  . Colon polyps     Social History   Social History  . Marital Status: Married    Spouse Name: N/A   . Number of Children: N/A  . Years of Education: N/A   Occupational History  . Not on file.   Social History Main Topics  . Smoking status: Never Smoker   . Smokeless tobacco: Never Used  . Alcohol Use: No  . Drug Use: No  . Sexual Activity: No   Other Topics Concern  . Not on file   Social History Narrative    Past Surgical History  Procedure Laterality Date  . Flexible sigmoidoscopy  2006  . Cholecystectomy  2008  . Colonoscopy  2005, 2014    Dr Bary Castilla  . Abdominal hysterectomy    . Salpingo oophorectmy     . Tonsillectomy    . Pacemaker placement  2008    Her family history includes Alcohol abuse in her brother; Heart disease in her brother; Hypertension in her mother; Stroke in her father.    Outpatient Prescriptions Prior to Visit  Medication Sig Dispense Refill  . Calcium Carbonate-Vitamin D 600-400 MG-UNIT per tablet Take by mouth.    . cetirizine (ZYRTEC) 10 MG tablet Take by mouth.    . enalapril (VASOTEC) 5 MG tablet Take 1 tablet (5 mg total) by mouth daily. 30 tablet 12  . ferrous sulfate 325 (65 FE) MG tablet Take by mouth.    . fluticasone (VERAMYST) 27.5 MCG/SPRAY nasal spray Place 2 sprays into the nose daily.    Marland Kitchen glucose blood test strip ACCU-CHEK ACTIVE (In Vitro Strip)  1 (one) Strip check sugar once daily for 0 days  Quantity: 50;  Refills: 12   Ordered :26-Jan-2014  Miguel Aschoff MD;  Started 26-Jan-2014 Active Comments: DX: E11.9    . metFORMIN (GLUCOPHAGE) 500 MG tablet TAKE TWO TABLETS BY MOUTH DAILY 60 tablet 12  . Multiple Vitamins-Minerals (CENTRUM SILVER) tablet Take by mouth.    Marland Kitchen omeprazole (PRILOSEC) 20 MG capsule TAKE ONE CAPSULE BY MOUTH EVERY DAY 30 capsule 11  . pravastatin (PRAVACHOL) 40 MG tablet Take by mouth.    . raloxifene (EVISTA) 60 MG tablet Take by mouth.    . rivaroxaban (XARELTO) 20 MG TABS tablet Take by mouth.     No facility-administered medications prior to visit.    Allergies  Allergen Reactions  . Codeine  Other (See Comments)    Unable to sleep, hypes her up  . Codeine Sulfate Other (See Comments)    Patient Care Team: Jerrol Banana., MD as PCP - General (Family Medicine) Robert Bellow, MD (General Surgery)  Objective:   Vitals:  Filed Vitals:   10/31/14 0946  BP: 142/64  Pulse: 72  Temp: 98.2 F (36.8 C)  Resp: 10  Height: 5\' 2"  (1.575 m)  Weight: 175 lb (79.379 kg)    Physical Exam  Constitutional: She is oriented to person, place, and time. She appears well-developed and well-nourished.  HENT:  Head: Normocephalic and atraumatic.  Right Ear: External ear normal.  Left Ear: External ear normal.  Nose: Nose normal.  Mouth/Throat: Oropharynx is clear and moist.  Eyes: Conjunctivae and EOM are normal. Pupils are equal, round, and reactive to light.  Neck: Normal range of motion. Neck supple.  Cardiovascular: Normal rate, regular rhythm, normal heart sounds and intact distal pulses.   Pulmonary/Chest: Effort normal and breath sounds normal.  Abdominal: Soft. Bowel sounds are normal.  Genitourinary: Vagina normal and uterus normal.  Musculoskeletal: Normal range of motion.  Neurological: She is alert and oriented to person, place, and time. She has normal reflexes.  Skin: Skin is warm and dry.  Psychiatric: She has a normal mood and affect. Her behavior is normal. Judgment and thought content normal.  Nursing note and vitals reviewed.   Activities of Daily Living In your present state of health, do you have any difficulty performing the following activities: 10/31/2014  Hearing? Y  Vision? N  Difficulty concentrating or making decisions? N  Walking or climbing stairs? N  Dressing or bathing? N  Doing errands, shopping? N    Fall Risk Assessment Fall Risk  10/31/2014  Falls in the past year? Yes  Number falls in past yr: 1  Injury with Fall? No  Follow up Falls prevention discussed;Education provided     Depression Screen PHQ 2/9 Scores 10/31/2014   PHQ - 2 Score 0    Cognitive Testing - 6-CIT    Year: 0 4 points  Month: 0 3 points  Memorize "Ing[place], [place], [address] [place]"  Time (within 1 hour:) 0 3 points  Count backwards from 20: 0 2 4 points  Name months of year: 0 2 4 points  Repeat Address: 0 2 4 6 8 10  points   Total Score: 0/28  Interpretation : Normal (0-7) Abnormal (8-28)    Assessment & Plan:     Annual Wellness Visit  Reviewed patient's Family Medical History Reviewed and updated list of patient's medical providers Assessment of cognitive impairment was done Assessed patient's functional ability Established a written schedule for health screening [place] Completed and Reviewed    Immunization History  Administered Date(s) Administered  . Pneumococcal Conjugate-13 07/19/2013  . Pneumococcal Polysaccharide-23  10/04/1997  . Td 03/01/2009     [facility] 10/31/2014 9:46 AM  ------------------------------------------------------------------------------------------------------------

## 2014-11-05 DIAGNOSIS — M7542 Impingement syndrome of left shoulder: Secondary | ICD-10-CM | POA: Diagnosis not present

## 2014-11-05 DIAGNOSIS — M7541 Impingement syndrome of right shoulder: Secondary | ICD-10-CM | POA: Diagnosis not present

## 2014-12-06 ENCOUNTER — Other Ambulatory Visit: Payer: Self-pay | Admitting: Family Medicine

## 2014-12-17 ENCOUNTER — Ambulatory Visit
Admission: RE | Admit: 2014-12-17 | Discharge: 2014-12-17 | Disposition: A | Discharge status: Home / Self Care | Payer: Medicare Other | Source: Ambulatory Visit | Attending: Family Medicine | Admitting: Family Medicine

## 2014-12-17 ENCOUNTER — Ambulatory Visit (INDEPENDENT_AMBULATORY_CARE_PROVIDER_SITE_OTHER): Payer: Medicare Other | Admitting: Family Medicine

## 2014-12-17 VITALS — BP 130/78 | HR 80 | Temp 97.0°F | Resp 16

## 2014-12-17 DIAGNOSIS — M7989 Other specified soft tissue disorders: Secondary | ICD-10-CM | POA: Diagnosis not present

## 2014-12-17 DIAGNOSIS — M25561 Pain in right knee: Secondary | ICD-10-CM | POA: Insufficient documentation

## 2014-12-17 DIAGNOSIS — M7051 Other bursitis of knee, right knee: Secondary | ICD-10-CM | POA: Diagnosis not present

## 2014-12-17 MED ORDER — MELOXICAM 7.5 MG PO TABS: 7.5000 mg | ORAL_TABLET | Freq: Two times a day (BID) | ORAL | Status: DC

## 2014-12-17 NOTE — Progress Notes (Signed)
Patient ID: Taylor Snyder, female   DOB: 1928-11-13, 81 y.o.   MRN: [id number]   Taylor Snyder  MRN: [id number] DOB: 08-12-1928  Subjective:  HPI   1. Right knee pain Patient is an 81 year old female who presents for evaluation of right knee pain.  She states she has not had any recent trauma to the knee,  She did have an injury from a fall 20 years ago but did not seek any treatment at the time.  She has not really had any problem with the knee since then until about 2 weeks ago.  She noticed about 2 weeks ago having weakness with no pain but then this past Friday (4 days ago) it hurt the worst it had.  She states that today the pain isn't as bad as Friday but states it still hurts badly.  She said she can walk on it but it causes a lot of pain.  She comes in today with her husband and it is of note that she is using the wheelchair.  Patient Active Problem List   Diagnosis Date Noted  . A-fib ([place]) 06/18/2014  . Allergic rhinitis 06/18/2014  . Airway hyperreactivity 06/18/2014  . Anemia due to unknown mechanism 06/18/2014  . CAFL (chronic airflow limitation) ([place]) 06/18/2014  . Diabetes mellitus, type 2 ([place]) 06/18/2014  . HLD (hyperlipidemia) 06/18/2014  . BP (high blood pressure) 06/18/2014  . History of prolonged Q-T interval on ECG 06/18/2014  . Mitral valve disorder 06/18/2014  . Arthritis, degenerative 06/18/2014  . Adiposity 06/18/2014  . OP (osteoporosis) 06/18/2014  . Benign essential HTN 06/08/2014  . Combined fat and carbohydrate induced hyperlipemia 12/28/2013  . MI (mitral incompetence) 12/28/2013  . Sick sinus syndrome ([place]) 12/28/2013  . Anemia, iron deficiency 10/18/2013  . Personal history of colonic polyps 08/29/2012  . Colon polyps     Past Medical History  Diagnosis Date  . Diabetes mellitus without complication 3976  . Colon polyps 2005  . A-fib   . Anemia     Social History   Social History  . Marital Status: Married    Spouse Name: N/A  . Number of  Children: N/A  . Years of Education: N/A   Occupational History  . Not on file.   Social History Main Topics  . Smoking status: Never Smoker   . Smokeless tobacco: Never Used  . Alcohol Use: No  . Drug Use: No  . Sexual Activity: No   Other Topics Concern  . Not on file   Social History Narrative    Outpatient Prescriptions Prior to Visit  Medication Sig Dispense Refill  . Calcium Carbonate-Vitamin D 600-400 MG-UNIT per tablet Take by mouth.    . cetirizine (ZYRTEC) 10 MG tablet Take by mouth.    . enalapril (VASOTEC) 5 MG tablet Take 1 tablet (5 mg total) by mouth daily. 30 tablet 12  . ferrous sulfate 325 (65 FE) MG tablet Take by mouth.    . fluticasone (FLONASE) 50 MCG/ACT nasal spray USE 2 SPRAYS IN EACH NOSTRIL ONCE DAILY 16 g 12  . glucose blood test strip ACCU-CHEK ACTIVE (In Vitro Strip)  1 (one) Strip check sugar once daily for 0 days  Quantity: 50;  Refills: 12   Ordered :26-Jan-2014  Miguel Aschoff MD;  Started 26-Jan-2014 Active Comments: DX: E11.9    . metFORMIN (GLUCOPHAGE) 500 MG tablet TAKE TWO TABLETS BY MOUTH DAILY 60 tablet 12  . Multiple Vitamins-Minerals (CENTRUM SILVER) tablet Take by  mouth.    . omeprazole (PRILOSEC) 20 MG capsule TAKE ONE CAPSULE BY MOUTH EVERY DAY 30 capsule 11  . pravastatin (PRAVACHOL) 40 MG tablet Take by mouth.    . raloxifene (EVISTA) 60 MG tablet Take by mouth.    . rivaroxaban (XARELTO) 20 MG TABS tablet Take by mouth.    . fluticasone (VERAMYST) 27.5 MCG/SPRAY nasal spray Place 2 sprays into the nose daily.     No facility-administered medications prior to visit.    Allergies  Allergen Reactions  . Codeine Other (See Comments)    Unable to sleep, hypes her up  . Codeine Sulfate Other (See Comments)    Review of Systems  Respiratory: Negative.   Cardiovascular: Negative.   Musculoskeletal: Positive for joint pain (knees and shoulder).  Neurological: Negative for dizziness and headaches.   Objective:  BP 130/78  mmHg  Pulse 80  Temp(Src) 97 F (36.1 C)  Resp 16  Physical Exam  Constitutional: She is well-developed, well-nourished, and in no distress.  HENT:  Head: Normocephalic.  Neck: Normal range of motion. Neck supple.  Cardiovascular: Normal rate, regular rhythm and normal heart sounds.   Pulmonary/Chest: Effort normal and breath sounds normal.  Musculoskeletal:  Tender along the entire joint line and over the patella tendon. The right knee is diffusely swollen compared to the left knee. There is no warmth to the knee. No erythema.  Skin: Skin is warm and dry.  Psychiatric: Mood, memory, affect and judgment normal.    Assessment and Plan :   1. Right knee pain  - meloxicam (MOBIC) 7.5 MG tablet; Take 1 tablet (7.5 mg total) by mouth 2 (two) times daily.  Dispense: 60 tablet; Refill: 0 - Ambulatory referral to Orthopedic Surgery - DG Knee Complete 4 Views Right; Future  2. Bursitis of right knee  I have done the exam and reviewed the above chart and it is accurate to the best of my knowledge.    Miguel Aschoff MD [facility] 12/17/2014 1:56 PM

## 2014-12-19 DIAGNOSIS — M1711 Unilateral primary osteoarthritis, right knee: Secondary | ICD-10-CM | POA: Diagnosis not present

## 2014-12-25 ENCOUNTER — Other Ambulatory Visit: Payer: Self-pay | Admitting: Family Medicine

## 2014-12-27 DIAGNOSIS — E782 Mixed hyperlipidemia: Secondary | ICD-10-CM | POA: Diagnosis not present

## 2014-12-27 DIAGNOSIS — M25561 Pain in right knee: Secondary | ICD-10-CM | POA: Diagnosis not present

## 2014-12-27 DIAGNOSIS — I495 Sick sinus syndrome: Secondary | ICD-10-CM | POA: Diagnosis not present

## 2014-12-27 DIAGNOSIS — I10 Essential (primary) hypertension: Secondary | ICD-10-CM | POA: Diagnosis not present

## 2014-12-31 ENCOUNTER — Ambulatory Visit (INDEPENDENT_AMBULATORY_CARE_PROVIDER_SITE_OTHER): Payer: Medicare Other | Admitting: Family Medicine

## 2014-12-31 VITALS — HR 80 | Temp 97.9°F | Resp 16

## 2014-12-31 DIAGNOSIS — E119 Type 2 diabetes mellitus without complications: Secondary | ICD-10-CM

## 2014-12-31 LAB — POCT GLYCOSYLATED HEMOGLOBIN (HGB A1C): Hemoglobin A1C: 7.3

## 2014-12-31 NOTE — Progress Notes (Signed)
Patient ID: Taylor Snyder, female   DOB: 18-May-1928, 81 y.o.   MRN: [id number]   Taylor Snyder  MRN: [id number] DOB: 06/22/1928  Subjective:  HPI   1. Type 2 diabetes mellitus without complication, without long-term current use of insulin [facility])  Patient is an 81 year old female who presents for follow up of her diabetes. Her last visit was on 12/17/14.  No management changes were made at that time.  Her last A1C was on 07/31/14 and was 7.6.  She has periodically been checking her glucose at home and it has been running a little high due to some steroid use for her knee.  She states it has been running 150s-180s.  She is currently on Metformin 500 mg 2 daily.    Patient Active Problem List   Diagnosis Date Noted  . A-fib ([place]) 06/18/2014  . Allergic rhinitis 06/18/2014  . Airway hyperreactivity 06/18/2014  . Anemia due to unknown mechanism 06/18/2014  . CAFL (chronic airflow limitation) ([place]) 06/18/2014  . Diabetes mellitus, type 2 ([place]) 06/18/2014  . HLD (hyperlipidemia) 06/18/2014  . BP (high blood pressure) 06/18/2014  . History of prolonged Q-T interval on ECG 06/18/2014  . Mitral valve disorder 06/18/2014  . Arthritis, degenerative 06/18/2014  . Adiposity 06/18/2014  . OP (osteoporosis) 06/18/2014  . Benign essential HTN 06/08/2014  . Combined fat and carbohydrate induced hyperlipemia 12/28/2013  . MI (mitral incompetence) 12/28/2013  . Sick sinus syndrome ([place]) 12/28/2013  . Anemia, iron deficiency 10/18/2013  . Personal history of colonic polyps 08/29/2012  . Colon polyps     Past Medical History  Diagnosis Date  . Diabetes mellitus without complication [id number]  . Colon polyps 2005  . A-fib   . Anemia     Social History   Social History  . Marital Status: Married    Spouse Name: N/A  . Number of Children: N/A  . Years of Education: N/A   Occupational History  . Not on file.   Social History Main Topics  . Smoking status: Never Smoker   . Smokeless tobacco: Never  Used  . Alcohol Use: No  . Drug Use: No  . Sexual Activity: No   Other Topics Concern  . Not on file   Social History Narrative    Outpatient Prescriptions Prior to Visit  Medication Sig Dispense Refill  . Calcium Carbonate-Vitamin D 600-400 MG-UNIT per tablet Take by mouth.    . cetirizine (ZYRTEC) 10 MG tablet Take by mouth.    . enalapril (VASOTEC) 5 MG tablet Take 1 tablet (5 mg total) by mouth daily. 30 tablet 12  . ferrous sulfate 324 (65 FE) MG TBEC TAKE ONE TABLET BY MOUTH TWICE DAILY 60 tablet 12  . fluticasone (FLONASE) 50 MCG/ACT nasal spray USE 2 SPRAYS IN EACH NOSTRIL ONCE DAILY 16 g 12  . glucose blood test strip ACCU-CHEK ACTIVE (In Vitro Strip)  1 (one) Strip check sugar once daily for 0 days  Quantity: 50;  Refills: 12   Ordered :26-Jan-2014  Miguel Aschoff MD;  Started 26-Jan-2014 Active Comments: DX: E11.9    . meloxicam (MOBIC) 7.5 MG tablet Take 1 tablet (7.5 mg total) by mouth 2 (two) times daily. 60 tablet 0  . metFORMIN (GLUCOPHAGE) 500 MG tablet TAKE TWO TABLETS BY MOUTH DAILY 60 tablet 12  . Multiple Vitamins-Minerals (CENTRUM SILVER) tablet Take by mouth.    Marland Kitchen omeprazole (PRILOSEC) 20 MG capsule TAKE ONE CAPSULE BY MOUTH EVERY DAY 30 capsule 11  .  pravastatin (PRAVACHOL) 40 MG tablet Take by mouth.    . raloxifene (EVISTA) 60 MG tablet Take by mouth.    . rivaroxaban (XARELTO) 20 MG TABS tablet Take by mouth.    . ferrous sulfate 325 (65 FE) MG tablet Take by mouth.     No facility-administered medications prior to visit.    Allergies  Allergen Reactions  . Codeine Other (See Comments)    Unable to sleep, hypes her up  . Codeine Sulfate Other (See Comments)    Review of Systems  Constitutional: Negative.   Eyes: Negative.   Respiratory: Negative.   Cardiovascular: Negative.   Gastrointestinal: Negative.   Musculoskeletal: Positive for joint pain.       Patient had steroid injection 2 weeks ago to her right knee.  Skin: Negative.     Neurological: Negative for dizziness and headaches.  Endo/Heme/Allergies: Negative.   Psychiatric/Behavioral: Negative.    Objective:  Pulse 80  Temp(Src) 97.9 F (36.6 C) (Oral)  Resp 16  Physical Exam  Constitutional: She is oriented to person, place, and time and well-developed, well-nourished, and in no distress.  HENT:  Head: Normocephalic and atraumatic.  Right Ear: External ear normal.  Left Ear: External ear normal.  Nose: Nose normal.  Eyes: Conjunctivae are normal.  Neck: Neck supple.  Cardiovascular: Normal rate, regular rhythm and normal heart sounds.   Pulmonary/Chest: Effort normal and breath sounds normal.  Abdominal: Soft.  Neurological: She is alert and oriented to person, place, and time.  Skin: Skin is warm and dry.  Psychiatric: Mood, memory, affect and judgment normal.    Assessment and Plan :  Type 2 diabetes mellitus without complication, without long-term current use of insulin (HCC)  A1C is 7.3 today--no changes. Osteoarthritis Hypertension I have done the exam and reviewed the above chart and it is accurate to the best of my knowledge.  Miguel Aschoff MD [facility] 12/31/2014 1:51 PM

## 2015-01-15 ENCOUNTER — Other Ambulatory Visit: Payer: Self-pay | Admitting: Family Medicine

## 2015-02-05 ENCOUNTER — Other Ambulatory Visit: Payer: Self-pay

## 2015-02-05 MED ORDER — FLUTICASONE-SALMETEROL 100-50 MCG/DOSE IN AEPB: 1.0000 | INHALATION_SPRAY | Freq: Two times a day (BID) | RESPIRATORY_TRACT | Status: DC

## 2015-04-04 ENCOUNTER — Other Ambulatory Visit: Payer: Self-pay | Admitting: Family Medicine

## 2015-04-04 DIAGNOSIS — D1801 Hemangioma of skin and subcutaneous tissue: Secondary | ICD-10-CM | POA: Diagnosis not present

## 2015-04-04 DIAGNOSIS — D692 Other nonthrombocytopenic purpura: Secondary | ICD-10-CM | POA: Diagnosis not present

## 2015-04-04 DIAGNOSIS — X32XXXA Exposure to sunlight, initial encounter: Secondary | ICD-10-CM | POA: Diagnosis not present

## 2015-04-04 DIAGNOSIS — L578 Other skin changes due to chronic exposure to nonionizing radiation: Secondary | ICD-10-CM | POA: Diagnosis not present

## 2015-04-09 ENCOUNTER — Other Ambulatory Visit: Payer: Self-pay | Admitting: Family Medicine

## 2015-04-18 ENCOUNTER — Telehealth: Payer: Self-pay

## 2015-04-18 NOTE — Telephone Encounter (Signed)
lmtcb- received letter from insurance they wont cover meloxicam 7.5 mg twice daily only for 1 tablet daily. Spoke with dr. Rosanna Randy and he rather patient not to take this medication daily anyway due to risks of long term use for this. Need to speak with patient-aa

## 2015-04-22 NOTE — Telephone Encounter (Signed)
Spoke with patient, advised as below, she received a letter from insurance too. She has been taking this medication every other week and is not taking it daily.-aa

## 2015-04-22 NOTE — Telephone Encounter (Signed)
[email or web address]-aa 

## 2015-04-22 NOTE — Telephone Encounter (Signed)
Pt is returning call.  HT:[id number]

## 2015-04-27 ENCOUNTER — Other Ambulatory Visit: Payer: Self-pay | Admitting: Family Medicine

## 2015-04-30 ENCOUNTER — Encounter: Payer: Self-pay | Admitting: Family Medicine

## 2015-04-30 ENCOUNTER — Ambulatory Visit (INDEPENDENT_AMBULATORY_CARE_PROVIDER_SITE_OTHER): Payer: Medicare Other | Admitting: Family Medicine

## 2015-04-30 VITALS — BP 134/62 | HR 88 | Temp 97.9°F | Resp 12 | Wt 175.0 lb

## 2015-04-30 DIAGNOSIS — E119 Type 2 diabetes mellitus without complications: Secondary | ICD-10-CM

## 2015-04-30 DIAGNOSIS — I10 Essential (primary) hypertension: Secondary | ICD-10-CM

## 2015-04-30 DIAGNOSIS — E785 Hyperlipidemia, unspecified: Secondary | ICD-10-CM | POA: Diagnosis not present

## 2015-04-30 DIAGNOSIS — M1990 Unspecified osteoarthritis, unspecified site: Secondary | ICD-10-CM

## 2015-04-30 LAB — POCT GLYCOSYLATED HEMOGLOBIN (HGB A1C): Hemoglobin A1C: 7.4

## 2015-04-30 MED ORDER — MELOXICAM 7.5 MG PO TABS: 7.5000 mg | ORAL_TABLET | Freq: Every day | ORAL | Status: DC

## 2015-04-30 MED ORDER — OMEPRAZOLE 20 MG PO CPDR: 20.0000 mg | DELAYED_RELEASE_CAPSULE | Freq: Every day | ORAL | Status: DC

## 2015-04-30 NOTE — Progress Notes (Signed)
Patient ID: Taylor Snyder, female   DOB: [birth year], 82 y.o.   MRN: [id number]    Subjective:  HPI  Diabetes Mellitus Type II, Follow-up:   Lab Results  Component Value Date   HGBA1C 7.3 12/31/2014   HGBA1C 7.6* 07/31/2014   HGBA1C 7.2* 04/03/2014    Last seen for diabetes 3 months ago.  Management since then includes none. She reports good compliance with treatment. She is not having side effects.  Current symptoms include none. Home blood sugar records: 140-180's  Episodes of hypoglycemia? no   Current Insulin Regimen: n/a Most Recent Eye Exam: about a year ago she has an appt for this month. Current exercise: walking  Pertinent Labs:    Component Value Date/Time   CHOL 147 07/31/2014 0928   CHOL 157 07/20/2013   TRIG 161* 07/31/2014 0928   HDL 57 07/31/2014 0928   HDL 57 07/31/2014 0928   HDL 68 07/20/2013   LDLCALC 58 07/31/2014 0928   LDLCALC 66 07/20/2013   CREATININE 0.64 07/31/2014 0928   CREATININE 0.8 07/20/2013    Wt Readings from Last 3 Encounters:  04/30/15 175 lb (79.379 kg)  10/31/14 175 lb (79.379 kg)  07/30/14 179 lb (81.194 kg)    ------------------------------------------------------------------------    Hypertension, follow-up:  BP Readings from Last 3 Encounters:  04/30/15 134/62  12/17/14 130/78  10/31/14 142/64    She was last seen for hypertension 3 months ago.  BP at that visit was 130/78. Management since that visit includes none. She reports good compliance with treatment. She is not having side effects.  She is exercising. She is adherent to low salt diet.   Outside blood pressures are not being checked. She is experiencing none.  Patient denies chest pain, chest pressure/discomfort, claudication, dyspnea, exertional chest pressure/discomfort, fatigue, irregular heart beat, lower extremity edema, near-syncope and orthopnea.    Wt Readings from Last 3 Encounters:  04/30/15 175 lb (79.379 kg)  10/31/14 175 lb (79.379  kg)  07/30/14 179 lb (81.194 kg)    ------------------------------------------------------------------------ Pt reports that the only complaint that she has is her shoulder pain. She has seen Ortho in the past. She would like to know what else she can take for pain because she was taken off the Meloxicam.     Prior to Admission medications   Medication Sig Start Date End Date Taking? Authorizing Provider  ACCU-CHEK COMPACT PLUS test strip USE AS DIRECTED ONCE DAILY 04/29/15  Yes Jerrol Banana., MD  Calcium Carbonate-Vitamin D 600-400 MG-UNIT per tablet Take by mouth.   Yes Historical Provider, MD  cetirizine (ZYRTEC) 10 MG tablet Take by mouth.   Yes Historical Provider, MD  enalapril (VASOTEC) 5 MG tablet Take 1 tablet (5 mg total) by mouth daily. 08/08/14  Yes Richard Maceo Pro., MD  ferrous sulfate 324 (65 FE) MG TBEC TAKE ONE TABLET BY MOUTH TWICE DAILY 12/25/14  Yes Jerrol Banana., MD  fluticasone [facility]) 50 MCG/ACT nasal spray USE 2 SPRAYS IN EACH NOSTRIL ONCE DAILY 12/06/14  Yes Jerrol Banana., MD  Fluticasone-Salmeterol (ADVAIR) 100-50 MCG/DOSE AEPB Inhale 1 puff into the lungs 2 (two) times daily. 02/05/15  Yes Richard Maceo Pro., MD  metFORMIN (GLUCOPHAGE) 500 MG tablet TAKE TWO TABLETS BY MOUTH DAILY 09/09/14  Yes Jerrol Banana., MD  mometasone-formoterol [facility]) 200-5 MCG/ACT AERO Inhale into the lungs.   Yes Historical Provider, MD  Multiple Vitamins-Minerals (CENTRUM SILVER) tablet Take by mouth.   Yes  Historical Provider, MD  omeprazole (PRILOSEC) 20 MG capsule TAKE ONE CAPSULE BY MOUTH EVERY DAY 10/23/14  Yes Jerrol Banana., MD  pravastatin (PRAVACHOL) 40 MG tablet Take by mouth. 04/04/14  Yes Historical Provider, MD  raloxifene (EVISTA) 60 MG tablet TAKE ONE TABLET BY MOUTH EVERY DAY 01/16/15  Yes Richard Maceo Pro., MD  XARELTO 20 MG TABS tablet TAKE 1 TABLET BY MOUTH EVERY DAY 04/04/15  Yes Jerrol Banana., MD  meloxicam (MOBIC)  7.5 MG tablet TAKE 1 TABLET(7.5 MG) BY MOUTH TWICE DAILY Patient not taking: Reported on 04/30/2015 04/09/15   Jerrol Banana., MD    Patient Active Problem List   Diagnosis Date Noted  . A-fib ([place]) 06/18/2014  . Allergic rhinitis 06/18/2014  . Airway hyperreactivity 06/18/2014  . Anemia due to unknown mechanism 06/18/2014  . CAFL (chronic airflow limitation) ([place]) 06/18/2014  . Diabetes mellitus, type 2 ([place]) 06/18/2014  . HLD (hyperlipidemia) 06/18/2014  . BP (high blood pressure) 06/18/2014  . History of prolonged Q-T interval on ECG 06/18/2014  . Mitral valve disorder 06/18/2014  . Arthritis, degenerative 06/18/2014  . Adiposity 06/18/2014  . OP (osteoporosis) 06/18/2014  . Benign essential HTN 06/08/2014  . Combined fat and carbohydrate induced hyperlipemia 12/28/2013  . MI (mitral incompetence) 12/28/2013  . Sick sinus syndrome ([place]) 12/28/2013  . Anemia, iron deficiency 10/18/2013  . Personal history of colonic polyps 08/29/2012  . Colon polyps     Past Medical History  Diagnosis Date  . Diabetes mellitus without complication ([place]) [id number]  . Colon polyps 2005  . A-fib ([place])   . Anemia     Social History   Social History  . Marital Status: Married    Spouse Name: N/A  . Number of Children: N/A  . Years of Education: N/A   Occupational History  . Not on file.   Social History Main Topics  . Smoking status: Never Smoker   . Smokeless tobacco: Never Used  . Alcohol Use: No  . Drug Use: No  . Sexual Activity: No   Other Topics Concern  . Not on file   Social History Narrative    Allergies  Allergen Reactions  . Codeine Other (See Comments)    Unable to sleep, hypes her up  . Codeine Sulfate Other (See Comments)    Review of Systems  Constitutional: Positive for malaise/fatigue.  HENT: Negative.   Eyes: Negative.   Respiratory: Negative.   Cardiovascular: Negative.   Gastrointestinal: Negative.   Genitourinary: Negative.     Musculoskeletal: Positive for joint pain.  Skin: Negative.   Neurological: Negative.   Endo/Heme/Allergies: Negative.   Psychiatric/Behavioral: Negative.     Immunization History  Administered Date(s) Administered  . Influenza, High Dose Seasonal PF 10/31/2014  . Pneumococcal Conjugate-13 07/19/2013  . Pneumococcal Polysaccharide-23 10/04/1997  . Td 03/01/2009   Objective:  BP 134/62 mmHg  Pulse 88  Temp(Src) 97.9 F (36.6 C) (Oral)  Resp 12  Wt 175 lb (79.379 kg)  Physical Exam  Constitutional: She is oriented to person, place, and time and well-developed, well-nourished, and in no distress.  HENT:  Head: Normocephalic and atraumatic.  Right Ear: External ear normal.  Left Ear: External ear normal.  Nose: Nose normal.  Eyes: Conjunctivae and EOM are normal. Pupils are equal, round, and reactive to light.  Neck: Normal range of motion. Neck supple.  Cardiovascular: Normal rate, regular rhythm, normal heart sounds and intact distal pulses.   Pulmonary/Chest: Effort normal  and breath sounds normal.  Abdominal: Soft. Bowel sounds are normal.  Musculoskeletal: She exhibits edema (trace).  Neurological: She is alert and oriented to person, place, and time. She has normal reflexes. Gait normal. GCS score is 15.  Skin: Skin is warm and dry.  Psychiatric: Mood, memory, affect and judgment normal.    Lab Results  Component Value Date   WBC 6.4 07/31/2014   HGB 12.9 04/03/2014   HCT 37.3 07/31/2014   PLT 183 07/31/2014   GLUCOSE 147* 07/31/2014   CHOL 147 07/31/2014   TRIG 161* 07/31/2014   HDL 57 07/31/2014   HDL 57 07/31/2014   LDLCALC 58 07/31/2014   TSH 2.97 01/13/2013   HGBA1C 7.3 12/31/2014    CMP     Component Value Date/Time   NA 139 07/31/2014 0928   K 4.5 07/31/2014 0928   CL 99 07/31/2014 0928   CO2 22 07/31/2014 0928   GLUCOSE 147* 07/31/2014 0928   BUN 11 07/31/2014 0928   CREATININE 0.64 07/31/2014 0928   CREATININE 0.8 07/20/2013   CALCIUM  8.6* 07/31/2014 0928   PROT 5.8* 07/31/2014 0928   ALBUMIN 3.8 07/31/2014 0928   AST 13 07/31/2014 0928   ALT 14 07/31/2014 0928   ALKPHOS 76 07/31/2014 0928   BILITOT 0.3 07/31/2014 0928   GFRNONAA 82 07/31/2014 0928   GFRAA 95 07/31/2014 0928    Assessment and Plan :  1. Essential hypertension Good control. - CBC with Differential/Platelet - TSH  2. Type 2 diabetes mellitus without complication, without long-term current use of insulin (HCC)  - POCT HgB A1C - 7.4 today stable. - Comprehensive metabolic panel  3. Osteoarthritis, unspecified osteoarthritis type, unspecified site Try taking Meloxicam Daily until April and then Monday, Wednesday, Friday. - meloxicam (MOBIC) 7.5 MG tablet; Take 1 tablet (7.5 mg total) by mouth daily. Use as directed by MD  Dispense: 30 tablet; Refill: 5 - omeprazole (PRILOSEC) 20 MG capsule; Take 1 capsule (20 mg total) by mouth daily.  Dispense: 30 capsule; Refill: 12 Must be very careful with any therapy because of her chronic anticoagulation due to atrial fibrillation. NSAID's must be limited.more than 50% of this visit is spent in counseling regarding all these issues. 4. HLD (hyperlipidemia)  - Lipid Panel With LDL/HDL Ratio 5. Complete heart block--pacemaker in place and functional 6. Atrial fibrillation I have done the exam and reviewed the above chart and it is accurate to the best of my knowledge.   Patient was seen and examined by Dr. Miguel Aschoff, and noted scribed by Webb Laws, CMA I have done the exam and reviewed the above chart and it is accurate to the best of my knowledge.  Miguel Aschoff MD [facility] 04/30/2015 8:37 AM

## 2015-04-30 NOTE — Patient Instructions (Addendum)
Take Meloxicam daily until April 1st, then take it Monday, Wednesday, Friday. Do not take Ibuprofen with this, you may take Tylenol. Take Omeprazole daily for stomach protection.

## 2015-05-02 DIAGNOSIS — I10 Essential (primary) hypertension: Secondary | ICD-10-CM | POA: Diagnosis not present

## 2015-05-02 DIAGNOSIS — E119 Type 2 diabetes mellitus without complications: Secondary | ICD-10-CM | POA: Diagnosis not present

## 2015-05-02 DIAGNOSIS — E785 Hyperlipidemia, unspecified: Secondary | ICD-10-CM | POA: Diagnosis not present

## 2015-05-03 ENCOUNTER — Telehealth: Payer: Self-pay

## 2015-05-03 ENCOUNTER — Other Ambulatory Visit: Payer: Self-pay | Admitting: Family Medicine

## 2015-05-03 LAB — COMPREHENSIVE METABOLIC PANEL
A/G RATIO: 1.6 (ref 1.2–2.2)
ALT: 11 IU/L (ref 0–32)
AST: 13 IU/L (ref 0–40)
Albumin: 3.9 g/dL (ref 3.5–4.7)
Alkaline Phosphatase: 88 IU/L (ref 39–117)
BUN/Creatinine Ratio: 25 (ref 11–26)
BUN: 15 mg/dL (ref 8–27)
CALCIUM: 9.3 mg/dL (ref 8.7–10.3)
CHLORIDE: 96 mmol/L (ref 96–106)
CO2: 20 mmol/L (ref 18–29)
Creatinine, Ser: 0.6 mg/dL (ref 0.57–1.00)
GFR calc non Af Amer: 84 mL/min/{1.73_m2} (ref >59)
GFR, EST AFRICAN AMERICAN: 97 mL/min/{1.73_m2} (ref >59)
GLUCOSE: 149 mg/dL — AB (ref 65–99)
Globulin, Total: 2.4 g/dL (ref 1.5–4.5)
POTASSIUM: 4.8 mmol/L (ref 3.5–5.2)
Sodium: 138 mmol/L (ref 134–144)
TOTAL PROTEIN: 6.3 g/dL (ref 6.0–8.5)

## 2015-05-03 LAB — CBC WITH DIFFERENTIAL/PLATELET
BASOS: 0 %
Basophils Absolute: 0 10*3/uL (ref 0.0–0.2)
EOS (ABSOLUTE): 0.1 10*3/uL (ref 0.0–0.4)
EOS: 1 %
HEMATOCRIT: 35.3 % (ref 34.0–46.6)
Hemoglobin: 11.6 g/dL (ref 11.1–15.9)
Immature Grans (Abs): 0 10*3/uL (ref 0.0–0.1)
Immature Granulocytes: 0 %
LYMPHS ABS: 1.6 10*3/uL (ref 0.7–3.1)
Lymphs: 25 %
MCH: 30 pg (ref 26.6–33.0)
MCHC: 32.9 g/dL (ref 31.5–35.7)
MCV: 91 fL (ref 79–97)
MONOS ABS: 0.6 10*3/uL (ref 0.1–0.9)
Monocytes: 9 %
NEUTROS ABS: 4.2 10*3/uL (ref 1.4–7.0)
Neutrophils: 65 %
PLATELETS: 252 10*3/uL (ref 150–379)
RBC: 3.87 x10E6/uL (ref 3.77–5.28)
RDW: 14.4 % (ref 12.3–15.4)
WBC: 6.5 10*3/uL (ref 3.4–10.8)

## 2015-05-03 LAB — LIPID PANEL WITH LDL/HDL RATIO
Cholesterol, Total: 152 mg/dL (ref 100–199)
HDL: 72 mg/dL (ref >39)
LDL Calculated: 57 mg/dL (ref 0–99)
LDL/HDL RATIO: 0.8 ratio (ref 0.0–3.2)
Triglycerides: 113 mg/dL (ref 0–149)
VLDL Cholesterol Cal: 23 mg/dL (ref 5–40)

## 2015-05-03 LAB — TSH: TSH: 2.59 u[IU]/mL (ref 0.450–4.500)

## 2015-05-03 NOTE — Telephone Encounter (Signed)
Patient advised as below.  

## 2015-05-03 NOTE — Telephone Encounter (Signed)
-----   Message from Jerrol Banana., MD sent at 05/03/2015  8:30 AM EDT ----- Labs OK.

## 2015-05-15 DIAGNOSIS — H43813 Vitreous degeneration, bilateral: Secondary | ICD-10-CM | POA: Diagnosis not present

## 2015-05-24 ENCOUNTER — Other Ambulatory Visit: Payer: Self-pay | Admitting: Family Medicine

## 2015-06-05 ENCOUNTER — Ambulatory Visit (INDEPENDENT_AMBULATORY_CARE_PROVIDER_SITE_OTHER): Payer: Medicare Other | Admitting: Family Medicine

## 2015-06-05 ENCOUNTER — Encounter: Payer: Self-pay | Admitting: Family Medicine

## 2015-06-05 VITALS — BP 132/80 | HR 82 | Temp 98.5°F | Resp 16 | Wt 177.0 lb

## 2015-06-05 DIAGNOSIS — M2550 Pain in unspecified joint: Secondary | ICD-10-CM

## 2015-06-05 DIAGNOSIS — M1990 Unspecified osteoarthritis, unspecified site: Secondary | ICD-10-CM

## 2015-06-05 NOTE — Patient Instructions (Signed)
Restart Meloxicam for the rest of the month. Then take it only on Monday, Wednesday, Fridays.

## 2015-06-05 NOTE — Progress Notes (Signed)
Patient ID: Taylor Snyder, female   DOB: 09-Oct-1928, 82 y.o.   MRN: [id number]    Subjective:  HPI Pt is here for a 1 month month follow up for arthritis. She reports that the pain was better while taking the Meloxicam but she stopped it as directed at last OV. She has been taking Tylenol when she gets to where she can not take the pain anymore and that has helped but she was unsure of how much she could take.   Pt reports that she has had more swelling than normal. The left is worse than the right. She has always had some swelling but seems to have gotten worse. She wears support hose when she is working at the [employer] but is not working there as much as she was.    Prior to Admission medications   Medication Sig Start Date End Date Taking? Authorizing Provider  ACCU-CHEK COMPACT PLUS test strip USE AS DIRECTED ONCE DAILY 04/29/15  Yes Jerrol Banana., MD  Calcium Carbonate-Vitamin D 600-400 MG-UNIT per tablet Take by mouth.   Yes Historical Provider, MD  cetirizine (ZYRTEC) 10 MG tablet Take by mouth.   Yes Historical Provider, MD  DULERA 200-5 MCG/ACT AERO INHALE TWO PUFFS BY MOUTH TWICE DAILY 05/25/15  Yes Richard Maceo Pro., MD  enalapril (VASOTEC) 5 MG tablet Take 1 tablet (5 mg total) by mouth daily. 08/08/14  Yes Richard Maceo Pro., MD  ferrous sulfate 324 (65 FE) MG TBEC TAKE ONE TABLET BY MOUTH TWICE DAILY 12/25/14  Yes Jerrol Banana., MD  fluticasone [facility]) 50 MCG/ACT nasal spray USE 2 SPRAYS IN EACH NOSTRIL ONCE DAILY 12/06/14  Yes Jerrol Banana., MD  Fluticasone-Salmeterol (ADVAIR) 100-50 MCG/DOSE AEPB Inhale 1 puff into the lungs 2 (two) times daily. 02/05/15  Yes Richard Maceo Pro., MD  metFORMIN (GLUCOPHAGE) 500 MG tablet TAKE TWO TABLETS BY MOUTH DAILY 09/09/14  Yes Jerrol Banana., MD  Multiple Vitamins-Minerals (CENTRUM SILVER) tablet Take by mouth.   Yes Historical Provider, MD  omeprazole (PRILOSEC) 20 MG capsule Take 1 capsule (20 mg  total) by mouth daily. 04/30/15  Yes Richard Maceo Pro., MD  pravastatin (PRAVACHOL) 40 MG tablet TAKE ONE TABLET BY MOUTH NIGHTLY AT BEDTIME 05/04/15  Yes Richard Maceo Pro., MD  raloxifene (EVISTA) 60 MG tablet TAKE ONE TABLET BY MOUTH EVERY DAY 01/16/15  Yes Jerrol Banana., MD  XARELTO 20 MG TABS tablet TAKE 1 TABLET BY MOUTH EVERY DAY 05/04/15  Yes Jerrol Banana., MD  meloxicam (MOBIC) 7.5 MG tablet Take 1 tablet (7.5 mg total) by mouth daily. Use as directed by MD Patient not taking: Reported on 06/05/2015 04/30/15   Jerrol Banana., MD    Patient Active Problem List   Diagnosis Date Noted  . A-fib ([place]) 06/18/2014  . Allergic rhinitis 06/18/2014  . Airway hyperreactivity 06/18/2014  . Anemia due to unknown mechanism 06/18/2014  . CAFL (chronic airflow limitation) ([place]) 06/18/2014  . Diabetes mellitus, type 2 ([place]) 06/18/2014  . HLD (hyperlipidemia) 06/18/2014  . BP (high blood pressure) 06/18/2014  . History of prolonged Q-T interval on ECG 06/18/2014  . Mitral valve disorder 06/18/2014  . Arthritis, degenerative 06/18/2014  . Adiposity 06/18/2014  . OP (osteoporosis) 06/18/2014  . Benign essential HTN 06/08/2014  . Combined fat and carbohydrate induced hyperlipemia 12/28/2013  . MI (mitral incompetence) 12/28/2013  . Sick sinus syndrome ([place]) 12/28/2013  .  Anemia, iron deficiency 10/18/2013  . Personal history of colonic polyps 08/29/2012  . Colon polyps     Past Medical History  Diagnosis Date  . Diabetes mellitus without complication ([place]) [id number]  . Colon polyps 2005  . A-fib ([place])   . Anemia     Social History   Social History  . Marital Status: Married    Spouse Name: N/A  . Number of Children: N/A  . Years of Education: N/A   Occupational History  . Not on file.   Social History Main Topics  . Smoking status: Never Smoker   . Smokeless tobacco: Never Used  . Alcohol Use: No  . Drug Use: No  . Sexual Activity: No   Other Topics  Concern  . Not on file   Social History Narrative    Allergies  Allergen Reactions  . Codeine Other (See Comments)    Unable to sleep, hypes her up  . Codeine Sulfate Other (See Comments)    Review of Systems  Constitutional: Negative.   HENT: Negative.   Eyes: Negative.   Cardiovascular: Positive for leg swelling.  Gastrointestinal: Negative.   Genitourinary: Negative.   Musculoskeletal: Positive for joint pain.  Skin: Negative.   Neurological: Negative.   Endo/Heme/Allergies: Negative.   Psychiatric/Behavioral: Negative.     Immunization History  Administered Date(s) Administered  . Influenza, High Dose Seasonal PF 10/31/2014  . Pneumococcal Conjugate-13 07/19/2013  . Pneumococcal Polysaccharide-23 10/04/1997  . Td 03/01/2009   Objective:  BP 132/80 mmHg  Pulse 82  Temp(Src) 98.5 F (36.9 C) (Oral)  Resp 16  Wt 177 lb (80.287 kg)  Physical Exam  Constitutional: She is oriented to person, place, and time and well-developed, well-nourished, and in no distress.  HENT:  Head: Normocephalic and atraumatic.  Right Ear: External ear normal.  Left Ear: External ear normal.  Nose: Nose normal.  Eyes: Conjunctivae and EOM are normal. Pupils are equal, round, and reactive to light.  Neck: Normal range of motion. Neck supple.  Cardiovascular: Normal rate, regular rhythm, normal heart sounds and intact distal pulses.   Pulmonary/Chest: Effort normal and breath sounds normal.  Abdominal: Soft.  Musculoskeletal: She exhibits edema (1+ left foot and ankle more swollen than right).  Neurological: She is alert and oriented to person, place, and time. She has normal reflexes. Gait normal. GCS score is 15.  Skin: Skin is warm and dry.  Psychiatric: Mood, memory, affect and judgment normal.    Lab Results  Component Value Date   WBC 6.5 05/02/2015   HGB 12.9 04/03/2014   HCT 35.3 05/02/2015   PLT 252 05/02/2015   GLUCOSE 149* 05/02/2015   CHOL 152 05/02/2015   TRIG  113 05/02/2015   HDL 72 05/02/2015   LDLCALC 57 05/02/2015   TSH 2.590 05/02/2015   HGBA1C 7.4 04/30/2015    CMP     Component Value Date/Time   NA 138 05/02/2015 1002   K 4.8 05/02/2015 1002   CL 96 05/02/2015 1002   CO2 20 05/02/2015 1002   GLUCOSE 149* 05/02/2015 1002   BUN 15 05/02/2015 1002   CREATININE 0.60 05/02/2015 1002   CREATININE 0.8 07/20/2013   CALCIUM 9.3 05/02/2015 1002   PROT 6.3 05/02/2015 1002   ALBUMIN 3.9 05/02/2015 1002   AST 13 05/02/2015 1002   ALT 11 05/02/2015 1002   ALKPHOS 88 05/02/2015 1002   BILITOT <0.2 05/02/2015 1002   GFRNONAA 84 05/02/2015 1002   GFRAA 97 05/02/2015 1002  Assessment and Plan :  1. Osteoarthritis, unspecified osteoarthritis type, unspecified site Restart meloxicam until the end of the month, then Mon, Wed, Fri  2. Joint pain/multiple joints  - ANA - Sedimentation rate - Rheumatoid Factor - Ambulatory referral to Orthopedic Surgery  I have done the exam and reviewed the above chart and it is accurate to the best of my knowledge.  Patient was seen and examined by Dr. Miguel Aschoff, and noted scribed by Webb Laws, [place] MD [facility] 06/05/2015 9:50 AM

## 2015-06-06 LAB — ANA: ANA: NEGATIVE

## 2015-06-06 LAB — SEDIMENTATION RATE: Sed Rate: 33 mm/hr (ref 0–40)

## 2015-06-06 LAB — RHEUMATOID FACTOR

## 2015-06-25 DIAGNOSIS — E782 Mixed hyperlipidemia: Secondary | ICD-10-CM | POA: Diagnosis not present

## 2015-06-25 DIAGNOSIS — I480 Paroxysmal atrial fibrillation: Secondary | ICD-10-CM | POA: Diagnosis not present

## 2015-06-25 DIAGNOSIS — I10 Essential (primary) hypertension: Secondary | ICD-10-CM | POA: Diagnosis not present

## 2015-06-25 DIAGNOSIS — I495 Sick sinus syndrome: Secondary | ICD-10-CM | POA: Diagnosis not present

## 2015-10-04 ENCOUNTER — Other Ambulatory Visit: Payer: Self-pay | Admitting: Family Medicine

## 2015-10-04 DIAGNOSIS — I10 Essential (primary) hypertension: Secondary | ICD-10-CM

## 2015-10-04 NOTE — Telephone Encounter (Signed)
Last FU OV was 04/30/2015. Renaldo Fiddler, CMA

## 2015-11-06 ENCOUNTER — Other Ambulatory Visit: Payer: Self-pay | Admitting: Family Medicine

## 2015-11-13 ENCOUNTER — Other Ambulatory Visit: Payer: Self-pay | Admitting: Family Medicine

## 2015-12-24 ENCOUNTER — Ambulatory Visit (INDEPENDENT_AMBULATORY_CARE_PROVIDER_SITE_OTHER): Payer: Medicare Other | Admitting: Family Medicine

## 2015-12-24 VITALS — BP 130/58 | HR 64 | Temp 97.5°F | Resp 16 | Wt 165.0 lb

## 2015-12-24 DIAGNOSIS — E119 Type 2 diabetes mellitus without complications: Secondary | ICD-10-CM

## 2015-12-24 DIAGNOSIS — Z23 Encounter for immunization: Secondary | ICD-10-CM

## 2015-12-24 NOTE — Progress Notes (Signed)
Taylor Snyder  MRN: [id number] DOB: 02-24-1928  Subjective:  HPI   The patient is an 82 year old female who presents for follow up of her diabetes.  She was last seen on 06/05/15.  No management changes were made at that time.  Her last A1C was on 04/30/15 and it ws 7.4.  She denies any hypoglycemic symptoms or events.    The patient reports that she checks her glucose at [facility] and has been getting reading ranging 145-150 fasting and about 200 non fasting.  She would also like to have her flu shot tonight.  Patient Active Problem List   Diagnosis Date Noted  . A-fib ([place]) 06/18/2014  . Allergic rhinitis 06/18/2014  . Airway hyperreactivity 06/18/2014  . Anemia due to unknown mechanism 06/18/2014  . CAFL (chronic airflow limitation) ([place]) 06/18/2014  . Diabetes mellitus, type 2 ([place]) 06/18/2014  . HLD (hyperlipidemia) 06/18/2014  . BP (high blood pressure) 06/18/2014  . History of prolonged Q-T interval on ECG 06/18/2014  . Mitral valve disorder 06/18/2014  . Arthritis, degenerative 06/18/2014  . Adiposity 06/18/2014  . OP (osteoporosis) 06/18/2014  . Benign essential HTN 06/08/2014  . Combined fat and carbohydrate induced hyperlipemia 12/28/2013  . MI (mitral incompetence) 12/28/2013  . Sick sinus syndrome ([place]) 12/28/2013  . Anemia, iron deficiency 10/18/2013  . Personal history of colonic polyps 08/29/2012  . Colon polyps     Past Medical History:  Diagnosis Date  . A-fib ([place])   . Anemia   . Colon polyps 2005  . Diabetes mellitus without complication ([place]) [id number]    Social History   Social History  . Marital status: Married    Spouse name: N/A  . Number of children: N/A  . Years of education: N/A   Occupational History  . Not on file.   Social History Main Topics  . Smoking status: Never Smoker  . Smokeless tobacco: Never Used  . Alcohol use No  . Drug use: No  . Sexual activity: No   Other Topics Concern  . Not on file   Social History Narrative  . No  narrative on file    Outpatient Encounter Prescriptions as of 12/24/2015  Medication Sig Note  . ACCU-CHEK COMPACT PLUS test strip USE AS DIRECTED ONCE DAILY   . Calcium Carbonate-Vitamin D 600-400 MG-UNIT per tablet Take by mouth. 06/18/2014: Received from: [facility]  . cetirizine (ZYRTEC) 10 MG tablet Take by mouth. 06/18/2014: Received from: [facility]  . DULERA 200-5 MCG/ACT AERO INHALE TWO PUFFS BY MOUTH TWICE DAILY   . enalapril (VASOTEC) 5 MG tablet TAKE ONE TABLET BY MOUTH EVERY DAY.   . ferrous sulfate 324 (65 FE) MG TBEC TAKE ONE TABLET BY MOUTH TWICE DAILY   . fluticasone (FLONASE) 50 MCG/ACT nasal spray USE 2 SPRAYS IN EACH NOSTRIL ONCE DAILY   . Fluticasone-Salmeterol (ADVAIR) 100-50 MCG/DOSE AEPB Inhale 1 puff into the lungs 2 (two) times daily.   . metFORMIN (GLUCOPHAGE) 500 MG tablet TAKE 2 TABLETS BY MOUTH EVERY DAY   . Multiple Vitamins-Minerals (CENTRUM SILVER) tablet Take by mouth. 06/18/2014: Received from: [facility]  . omeprazole (PRILOSEC) 20 MG capsule Take 1 capsule (20 mg total) by mouth daily.   . pravastatin (PRAVACHOL) 40 MG tablet TAKE ONE TABLET BY MOUTH NIGHTLY AT BEDTIME   . raloxifene (EVISTA) 60 MG tablet TAKE ONE TABLET BY MOUTH EVERY DAY   . XARELTO 20 MG TABS tablet TAKE 1 TABLET BY MOUTH  EVERY DAY   . [DISCONTINUED] meloxicam (MOBIC) 7.5 MG tablet Take 1 tablet (7.5 mg total) by mouth daily. Use as directed by MD    No facility-administered encounter medications on file as of 12/24/2015.     Allergies  Allergen Reactions  . Codeine Sulfate Other (See Comments)  . Codeine Other (See Comments)    Other Reaction: insomnia Unable to sleep, hypes her up    Review of Systems  Constitutional: Negative for chills, fever and malaise/fatigue.  Eyes: Negative.   Respiratory: Negative for cough, shortness of breath and wheezing.   Cardiovascular: Positive for leg swelling. Negative for chest pain,  palpitations, orthopnea, claudication and PND.  Gastrointestinal: Negative.   Skin: Negative.   Neurological: Negative for dizziness and headaches.  Endo/Heme/Allergies: Negative.   Psychiatric/Behavioral: Negative.     Objective:  BP (!) 130/58 (BP Location: Right Arm, Patient Position: Sitting, Cuff Size: Normal)   Pulse 64   Temp 97.5 F (36.4 C) (Oral)   Resp 16   Wt 165 lb (74.8 kg)   BMI 30.18 kg/m   Physical Exam  Constitutional: She is oriented to person, place, and time and well-developed, well-nourished, and in no distress.  HENT:  Head: Normocephalic and atraumatic.  Right Ear: External ear normal.  Left Ear: External ear normal.  Nose: Nose normal.  Eyes: Conjunctivae are normal. No scleral icterus.  Neck: Neck supple. No thyromegaly present.  Cardiovascular: Normal rate, regular rhythm, normal heart sounds and intact distal pulses.   Pulmonary/Chest: Effort normal and breath sounds normal.  Abdominal: Soft.  Musculoskeletal: She exhibits edema.  Trace LE edema  Neurological: She is alert and oriented to person, place, and time. Gait normal. GCS score is 15.  Skin: Skin is warm and dry.  Psychiatric: Mood, memory, affect and judgment normal.    Assessment and Plan :  1. Type 2 diabetes mellitus without complication, without long-term current use of insulin (HCC)  - POCT glycosylated hemoglobin (Hb A1C)--7.3 today.  2. Need for influenza vaccination - Flu vaccine HIGH DOSE PF (Fluzone High dose) 3. Pacemaker In place and followed by cardiology I have done the exam and reviewed the above chart and it is accurate to the best of my knowledge. Development worker, community has been used in this note in any air is in the dictation or transcription are unintentional.  I have done the exam and reviewed the chart and it is accurate to the best of my knowledge. Miguel Aschoff M.D. [facility]

## 2016-01-02 LAB — POCT GLYCOSYLATED HEMOGLOBIN (HGB A1C): HEMOGLOBIN A1C: 7.3

## 2016-01-07 ENCOUNTER — Other Ambulatory Visit: Payer: Self-pay | Admitting: Family Medicine

## 2016-01-07 DIAGNOSIS — I340 Nonrheumatic mitral (valve) insufficiency: Secondary | ICD-10-CM | POA: Diagnosis not present

## 2016-01-07 DIAGNOSIS — I480 Paroxysmal atrial fibrillation: Secondary | ICD-10-CM | POA: Diagnosis not present

## 2016-01-07 DIAGNOSIS — I10 Essential (primary) hypertension: Secondary | ICD-10-CM | POA: Diagnosis not present

## 2016-01-07 DIAGNOSIS — E782 Mixed hyperlipidemia: Secondary | ICD-10-CM | POA: Diagnosis not present

## 2016-01-07 DIAGNOSIS — I495 Sick sinus syndrome: Secondary | ICD-10-CM | POA: Diagnosis not present

## 2016-01-07 NOTE — Telephone Encounter (Signed)
Please review for dr [email or web address]-aa 

## 2016-02-05 ENCOUNTER — Other Ambulatory Visit: Payer: Self-pay | Admitting: Physician Assistant

## 2016-02-05 ENCOUNTER — Other Ambulatory Visit: Payer: Self-pay | Admitting: Family Medicine

## 2016-02-05 NOTE — Telephone Encounter (Signed)
Last ov 12/24/15 Last filled 01/16/15

## 2016-02-05 NOTE — Telephone Encounter (Signed)
Last ov 01/08/16 Last filled 01/08/16. Please review. Thank you. sd

## 2016-02-26 ENCOUNTER — Ambulatory Visit (INDEPENDENT_AMBULATORY_CARE_PROVIDER_SITE_OTHER): Payer: Medicare Other

## 2016-02-26 VITALS — BP 128/58 | HR 80 | Temp 97.5°F | Ht 62.0 in | Wt 167.0 lb

## 2016-02-26 DIAGNOSIS — Z0000 Encounter for general adult medical examination without abnormal findings: Secondary | ICD-10-CM

## 2016-02-26 NOTE — Patient Instructions (Addendum)
Health Maintenance, Female Introduction Adopting a healthy lifestyle and getting preventive care can go a long way to promote health and wellness. Talk with your health care provider about what schedule of regular examinations is right for you. This is a good chance for you to check in with your provider about disease prevention and staying healthy. In between checkups, there are plenty of things you can do on your own. Experts have done a lot of research about which lifestyle changes and preventive measures are most likely to keep you healthy. Ask your health care provider for more information. Weight and diet Eat a healthy diet  Be sure to include plenty of vegetables, fruits, low-fat dairy products, and lean protein.  Do not eat a lot of foods high in solid fats, added sugars, or salt.  Get regular exercise. This is one of the most important things you can do for your health.  Most adults should exercise for at least 150 minutes each week. The exercise should increase your heart rate and make you sweat (moderate-intensity exercise).  Most adults should also do strengthening exercises at least twice a week. This is in addition to the moderate-intensity exercise. Maintain a healthy weight  Body mass index (BMI) is a measurement that can be used to identify possible weight problems. It estimates body fat based on height and weight. Your health care provider can help determine your BMI and help you achieve or maintain a healthy weight.  For females 4 years of age and older:  A BMI below 18.5 is considered underweight.  A BMI of 18.5 to 24.9 is normal.  A BMI of 25 to 29.9 is considered overweight.  A BMI of 30 and above is considered obese. Watch levels of cholesterol and blood lipids  You should start having your blood tested for lipids and cholesterol at 81 years of age, then have this test every 5 years.  You may need to have your cholesterol levels checked more often  if:  Your lipid or cholesterol levels are high.  You are older than 81 years of age.  You are at high risk for heart disease. Cancer screening Lung Cancer  Lung cancer screening is recommended for adults 12-31 years old who are at high risk for lung cancer because of a history of smoking.  A yearly low-dose CT scan of the lungs is recommended for people who:  Currently smoke.  Have quit within the past 15 years.  Have at least a 30-pack-year history of smoking. A pack year is smoking an average of one pack of cigarettes a day for 1 year.  Yearly screening should continue until it has been 15 years since you quit.  Yearly screening should stop if you develop a health problem that would prevent you from having lung cancer treatment. Breast Cancer  Practice breast self-awareness. This means understanding how your breasts normally appear and feel.  It also means doing regular breast self-exams. Let your health care provider know about any changes, no matter how small.  If you are in your 20s or 30s, you should have a clinical breast exam (CBE) by a health care provider every 1-3 years as part of a regular health exam.  If you are 74 or older, have a CBE every year. Also consider having a breast X-ray (mammogram) every year.  If you have a family history of breast cancer, talk to your health care provider about genetic screening.  If you are at high risk for breast cancer,  talk to your health care provider about having an MRI and a mammogram every year.  Breast cancer gene (BRCA) assessment is recommended for women who have family members with BRCA-related cancers. BRCA-related cancers include:  Breast.  Ovarian.  Tubal.  Peritoneal cancers.  Results of the assessment will determine the need for genetic counseling and BRCA1 and BRCA2 testing. Colorectal Cancer  This type of cancer can be detected and often prevented.  Routine colorectal cancer screening usually begins  at 81 years of age and continues through 81 years of age.  Your health care provider may recommend screening at an earlier age if you have risk factors for colon cancer.  Your health care provider may also recommend using home test kits to check for hidden blood in the stool.  A small camera at the end of a tube can be used to examine your colon directly (sigmoidoscopy or colonoscopy). This is done to check for the earliest forms of colorectal cancer.  Routine screening usually begins at age 50.  Direct examination of the colon should be repeated every 5-10 years through 81 years of age. However, you may need to be screened more often if early forms of precancerous polyps or small growths are found. Skin Cancer  Check your skin from head to toe regularly.  Tell your health care provider about any new moles or changes in moles, especially if there is a change in a mole's shape or color.  Also tell your health care provider if you have a mole that is larger than the size of a pencil eraser.  Always use sunscreen. Apply sunscreen liberally and repeatedly throughout the day.  Protect yourself by wearing long sleeves, pants, a wide-brimmed hat, and sunglasses whenever you are outside. Heart disease, diabetes, and high blood pressure  High blood pressure causes heart disease and increases the risk of stroke. High blood pressure is more likely to develop in:  People who have blood pressure in the high end of the normal range (130-139/85-89 mm Hg).  People who are overweight or obese.  People who are African American.  If you are 18-39 years of age, have your blood pressure checked every 3-5 years. If you are 40 years of age or older, have your blood pressure checked every year. You should have your blood pressure measured twice-once when you are at a hospital or clinic, and once when you are not at a hospital or clinic. Record the average of the two measurements. To check your blood pressure  when you are not at a hospital or clinic, you can use:  An automated blood pressure machine at a pharmacy.  A home blood pressure monitor.  If you are between 55 years and 79 years old, ask your health care provider if you should take aspirin to prevent strokes.  Have regular diabetes screenings. This involves taking a blood sample to check your fasting blood sugar level.  If you are at a normal weight and have a low risk for diabetes, have this test once every three years after 81 years of age.  If you are overweight and have a high risk for diabetes, consider being tested at a younger age or more often. Preventing infection Hepatitis B  If you have a higher risk for hepatitis B, you should be screened for this virus. You are considered at high risk for hepatitis B if:  You were born in a country where hepatitis B is common. Ask your health care provider which countries are   considered high risk.  Your parents were born in a high-risk country, and you have not been immunized against hepatitis B (hepatitis B vaccine).  You have HIV or AIDS.  You use needles to inject street drugs.  You live with someone who has hepatitis B.  You have had sex with someone who has hepatitis B.  You get hemodialysis treatment.  You take certain medicines for conditions, including cancer, organ transplantation, and autoimmune conditions. Hepatitis C  Blood testing is recommended for:  Everyone born from 1945 through 1965.  Anyone with known risk factors for hepatitis C. Osteoporosis and menopause  Osteoporosis is a disease in which the bones lose minerals and strength with aging. This can result in serious bone fractures. Your risk for osteoporosis can be identified using a bone density scan.  If you are 65 years of age or older, or if you are at risk for osteoporosis and fractures, ask your health care provider if you should be screened.  Ask your health care provider whether you should take  a calcium or vitamin D supplement to lower your risk for osteoporosis.  Menopause may have certain physical symptoms and risks.  Hormone replacement therapy may reduce some of these symptoms and risks. Talk to your health care provider about whether hormone replacement therapy is right for you. Follow these instructions at home:  Schedule regular health, dental, and eye exams.  Stay current with your immunizations.  Do not use any tobacco products including cigarettes, chewing tobacco, or electronic cigarettes.  If you are pregnant, do not drink alcohol.  If you are breastfeeding, limit how much and how often you drink alcohol.  Limit alcohol intake to no more than 1 drink per day for nonpregnant women. One drink equals 12 ounces of beer, 5 ounces of wine, or 1 ounces of hard liquor.  Do not use street drugs.  Do not share needles.  Ask your health care provider for help if you need support or information about quitting drugs.  Tell your health care provider if you often feel depressed.  Tell your health care provider if you have ever been abused or do not feel safe at home. This information is not intended to replace advice given to you by your health care provider. Make sure you discuss any questions you have with your health care provider. Document Released: 08/18/2010 Document Revised: 07/11/2015 Document Reviewed: 11/06/2014  2017 Elsevier  

## 2016-02-26 NOTE — Progress Notes (Signed)
Subjective:   Taylor Snyder is a 83 y.o. female who presents for Medicare Annual (Subsequent) preventive examination.  Review of Systems:  N/A  Cardiac Risk Factors include: advanced age (>75men, >76 women);diabetes mellitus;dyslipidemia;hypertension;obesity (BMI >30kg/m2)     Objective:     Vitals: BP (!) 128/58 (BP Location: Right Arm)   Pulse 80   Temp 97.5 F (36.4 C) (Oral)   Ht 5\' 2"  (1.575 m)   Wt 167 lb (75.8 kg)   BMI 30.54 kg/m   Body mass index is 30.54 kg/m.   Tobacco History  Smoking Status  . Never Smoker  Smokeless Tobacco  . Never Used     Counseling given: Not Answered   Past Medical History:  Diagnosis Date  . A-fib ([place])   . Anemia   . Chronic obstructive pulmonary disease (COPD) ([place])   . Colon polyps 2005  . Diabetes mellitus without complication ([place]) [id number]  . Hyperlipidemia   . Hypertension   . Obesity   . Rheumatic mitral valve failure   . Unspecified atrial fibrillation [facility])    Past Surgical History:  Procedure Laterality Date  . ABDOMINAL HYSTERECTOMY    . CHOLECYSTECTOMY  2008  . COLONOSCOPY  2005, 2014   Dr Bary Castilla  . FLEXIBLE SIGMOIDOSCOPY  2006  . PACEMAKER PLACEMENT  2008  . salpingo oophorectmy     . TONSILLECTOMY     Family History  Problem Relation Age of Onset  . Hypertension Mother   . Stroke Father   . Heart disease Brother   . Alcohol abuse Brother    History  Sexual Activity  . Sexual activity: No    Outpatient Encounter Prescriptions as of 02/26/2016  Medication Sig  . ACCU-CHEK COMPACT PLUS test strip USE AS DIRECTED ONCE DAILY  . Calcium Carbonate-Vitamin D 600-400 MG-UNIT per tablet Take by mouth.  . cetirizine (ZYRTEC) 10 MG tablet Take by mouth.  . Cholecalciferol (VITAMIN D3) 2000 units TABS Take by mouth.  . DULERA 200-5 MCG/ACT AERO INHALE TWO PUFFS BY MOUTH TWICE DAILY  . enalapril (VASOTEC) 5 MG tablet TAKE ONE TABLET BY MOUTH EVERY DAY.  . ferrous sulfate 324 (65 Fe) MG TBEC TAKE 1  TABLET BY MOUTH TWICE DAILY  . fluticasone (FLONASE) 50 MCG/ACT nasal spray USE 2 SPRAYS IN EACH NOSTRIL ONCE DAILY  . metFORMIN (GLUCOPHAGE) 500 MG tablet TAKE 2 TABLETS BY MOUTH EVERY DAY  . Multiple Vitamins-Minerals (CENTRUM SILVER) tablet Take by mouth.  Marland Kitchen omeprazole (PRILOSEC) 20 MG capsule Take 1 capsule (20 mg total) by mouth daily.  . pravastatin (PRAVACHOL) 40 MG tablet TAKE ONE TABLET BY MOUTH NIGHTLY AT BEDTIME  . raloxifene (EVISTA) 60 MG tablet TAKE ONE TABLET BY MOUTH EVERY DAY  . XARELTO 20 MG TABS tablet TAKE 1 TABLET BY MOUTH EVERY DAY  . Fluticasone-Salmeterol (ADVAIR) 100-50 MCG/DOSE AEPB Inhale 1 puff into the lungs 2 (two) times daily. (Patient not taking: Reported on 02/26/2016)   No facility-administered encounter medications on file as of 02/26/2016.     Activities of Daily Living In your present state of health, do you have any difficulty performing the following activities: 02/26/2016  Hearing? N  Vision? N  Difficulty concentrating or making decisions? N  Walking or climbing stairs? N  Dressing or bathing? N  Doing errands, shopping? N  Preparing Food and eating ? N  Using the Toilet? N  In the past six months, have you accidently leaked urine? Y  Do you have problems with  loss of bowel control? N  Managing your Medications? N  Managing your Finances? N  Housekeeping or managing your Housekeeping? N  Some recent data might be hidden    Patient Care Team: Jerrol Banana., MD as PCP - General (Family Medicine) Robert Bellow, MD (General Surgery) Corey Skains, MD as Consulting Physician (Cardiology)    Assessment:     Exercise Activities and Dietary recommendations Current Exercise Habits: The patient does not participate in regular exercise at present, Exercise limited by: cardiac condition(s) (pt concerned because she had a-fib)  Goals    . Reduce amount of fried foods in diet          Starting 02/26/16, I will try to avoid eating  fried foods.      Fall Risk Fall Risk  02/26/2016 10/31/2014  Falls in the past year? Yes Yes  Number falls in past yr: 1 1  Injury with Fall? No No  Follow up Falls prevention discussed Falls prevention discussed;Education provided   Depression Screen PHQ 2/9 Scores 02/26/2016 10/31/2014  PHQ - 2 Score 0 0     Cognitive Function        Immunization History  Administered Date(s) Administered  . Influenza, High Dose Seasonal PF 10/31/2014, 12/24/2015  . Pneumococcal Conjugate-13 07/19/2013  . Pneumococcal Polysaccharide-23 10/04/1997  . Td 03/01/2009   Screening Tests Health Maintenance  Topic Date Due  . FOOT EXAM  06/22/1938  . OPHTHALMOLOGY EXAM  06/22/1938  . HEMOGLOBIN A1C  07/01/2016  . TETANUS/TDAP  03/02/2019  . INFLUENZA VACCINE  Completed  . DEXA SCAN  Completed  . ZOSTAVAX  Completed  . PNA vac Low Risk Adult  Completed      Plan:  I have personally reviewed and addressed the Medicare Annual Wellness questionnaire and have noted the following in the patient's chart:  A. Medical and social history B. Use of alcohol, tobacco or illicit drugs  C. Current medications and supplements D. Functional ability and status E.  Nutritional status F.  Physical activity G. Advance directives H. List of other physicians I.  Hospitalizations, surgeries, and ER visits in previous 12 months J.  [place] such as hearing and vision if needed, cognitive and depression L. Referrals and appointments - none  In addition, I have reviewed and discussed with patient certain preventive protocols, quality metrics, and best practice recommendations. A written personalized care plan for preventive services as well as general preventive health recommendations were provided to patient.  See attached scanned questionnaire for additional information.   Signed,  Fabio Neighbors, LPN Nurse Health Advisor   MD Recommendations: Follow up on a diabetic foot exam at next  OV.  I have reviewed the health advisors note, was  available for consultation and I agree with documentation and plan. Miguel Aschoff MD [facility]

## 2016-04-03 DIAGNOSIS — L570 Actinic keratosis: Secondary | ICD-10-CM | POA: Diagnosis not present

## 2016-04-03 DIAGNOSIS — X32XXXA Exposure to sunlight, initial encounter: Secondary | ICD-10-CM | POA: Diagnosis not present

## 2016-04-03 DIAGNOSIS — L814 Other melanin hyperpigmentation: Secondary | ICD-10-CM | POA: Diagnosis not present

## 2016-05-04 ENCOUNTER — Other Ambulatory Visit: Payer: Self-pay | Admitting: Family Medicine

## 2016-05-26 ENCOUNTER — Ambulatory Visit (INDEPENDENT_AMBULATORY_CARE_PROVIDER_SITE_OTHER): Payer: Medicare Other | Admitting: Family Medicine

## 2016-05-26 ENCOUNTER — Encounter: Payer: Self-pay | Admitting: Family Medicine

## 2016-05-26 VITALS — BP 130/62 | HR 76 | Temp 97.3°F | Resp 16 | Wt 168.0 lb

## 2016-05-26 DIAGNOSIS — E7800 Pure hypercholesterolemia, unspecified: Secondary | ICD-10-CM

## 2016-05-26 DIAGNOSIS — Z950 Presence of cardiac pacemaker: Secondary | ICD-10-CM

## 2016-05-26 DIAGNOSIS — I10 Essential (primary) hypertension: Secondary | ICD-10-CM

## 2016-05-26 DIAGNOSIS — E119 Type 2 diabetes mellitus without complications: Secondary | ICD-10-CM | POA: Diagnosis not present

## 2016-05-26 LAB — POCT GLYCOSYLATED HEMOGLOBIN (HGB A1C)
ESTIMATED AVERAGE GLUCOSE: 169
HEMOGLOBIN A1C: 7.5

## 2016-05-26 NOTE — Progress Notes (Signed)
Patient: Taylor Snyder Female    DOB: [birth year]   83 y.o.   MRN: [id number] Visit Date: 05/26/2016  Today's Provider: [name], MD   Chief Complaint  Patient presents with  . Diabetes  . Hypertension  . Hyperlipidemia   Subjective:    HPI      Diabetes Mellitus Type II, Follow-up:   Lab Results  Component Value Date   HGBA1C 7.3 01/02/2016   HGBA1C 7.4 04/30/2015   HGBA1C 7.3 12/31/2014   Last seen for diabetes 5 months ago.  Management since then includes none. She reports excellent compliance with treatment. She is not having side effects.  Current symptoms include none and have been stable. Home blood sugar records: fasting range: 150-155  Episodes of hypoglycemia? no   Current Insulin Regimen: N/A Most Recent Eye Exam: due next month Weight trend: stable Prior visit with dietician: yes - at onset of DM Current diet: in general, a "healthy" diet   Current exercise: none. Pt does try to walk, but she feels unsteady on her feet. 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  ------------------------------------------------------------------------   Hypertension, follow-up:  BP Readings from Last 3 Encounters:  05/26/16 130/62  02/26/16 (!) 128/58  12/24/15 (!) 130/58    She was last seen for hypertension 5 months ago.  BP at that visit was 130/58. Management since that visit includes none.She reports excellent compliance with treatment. She is not having side effects.  She is not exercising. She is adherent to low salt diet.   Outside blood pressures are not being checked. She is experiencing none.  Patient denies chest pain, chest pressure/discomfort, dyspnea, irregular heart beat, lower extremity edema, near-syncope, orthopnea, palpitations and syncope.   Cardiovascular risk factors include advanced age (older than 13 for men, 66 for women), diabetes mellitus, dyslipidemia, family history of premature  cardiovascular disease and hypertension.    ------------------------------------------------------------------------    Lipid/Cholesterol, Follow-up:   Last seen for this 1 years ago.  Management since that visit includes none.  Last Lipid Panel:    Component Value Date/Time   CHOL 152 05/02/2015 1002   TRIG 113 05/02/2015 1002   HDL 72 05/02/2015 1002   LDLCALC 57 05/02/2015 1002    She reports excellent compliance with treatment. She is not having side effects.   Wt Readings from Last 3 Encounters:  05/26/16 168 lb (76.2 kg)  02/26/16 167 lb (75.8 kg)  12/24/15 165 lb (74.8 kg)    ------------------------------------------------------------------------    Allergies  Allergen Reactions  . Codeine Sulfate Other (See Comments)  . Codeine Other (See Comments)    Other Reaction: insomnia Unable to sleep, hypes her up     Current Outpatient Prescriptions:  .  ACCU-CHEK COMPACT PLUS test strip, USE AS DIRECTED ONCE DAILY, Disp: 100 each, Rfl: 12 .  ADVAIR DISKUS 100-50 MCG/DOSE AEPB, INHALE 1 PUFF INTO THE LUNGS TWICE DAILY, Disp: 3 each, Rfl: 3 .  Calcium Carbonate-Vitamin D 600-400 MG-UNIT per tablet, Take by mouth., Disp: , Rfl:  .  cetirizine (ZYRTEC) 10 MG tablet, Take by mouth., Disp: , Rfl:  .  Cholecalciferol (VITAMIN D3) 2000 units TABS, Take by mouth., Disp: , Rfl:  .  DULERA 200-5 MCG/ACT AERO, INHALE TWO PUFFS BY MOUTH TWICE DAILY, Disp: 13 g, Rfl: 12 .  enalapril (VASOTEC) 5 MG tablet, TAKE ONE TABLET BY MOUTH EVERY DAY., Disp: 90 tablet, Rfl: 3 .  ferrous  sulfate 324 (65 Fe) MG TBEC, TAKE 1 TABLET BY MOUTH TWICE DAILY, Disp: 60 tablet, Rfl: 11 .  fluticasone (FLONASE) 50 MCG/ACT nasal spray, USE 2 SPRAYS IN EACH NOSTRIL ONCE DAILY, Disp: 16 g, Rfl: 12 .  metFORMIN (GLUCOPHAGE) 500 MG tablet, TAKE 2 TABLETS BY MOUTH EVERY DAY, Disp: 60 tablet, Rfl: 11 .  Multiple Vitamins-Minerals (CENTRUM SILVER) tablet, Take by mouth., Disp: , Rfl:  .  omeprazole  (PRILOSEC) 20 MG capsule, Take 1 capsule (20 mg total) by mouth daily., Disp: 30 capsule, Rfl: 12 .  pravastatin (PRAVACHOL) 40 MG tablet, TAKE ONE TABLET BY MOUTH NIGHTLY AT BEDTIME, Disp: 30 tablet, Rfl: 11 .  raloxifene (EVISTA) 60 MG tablet, TAKE ONE TABLET BY MOUTH EVERY DAY, Disp: 30 tablet, Rfl: 11 .  XARELTO 20 MG TABS tablet, TAKE 1 TABLET BY MOUTH EVERY DAY, Disp: 90 tablet, Rfl: 3  Review of Systems  Constitutional: Negative for activity change, appetite change, chills, diaphoresis, fatigue, fever and unexpected weight change.  Respiratory: Negative for shortness of breath.   Cardiovascular: Negative for chest pain, palpitations and leg swelling.  Endocrine: Negative for polydipsia and polyuria.  Musculoskeletal: Positive for arthralgias (knees).  Neurological:       "unsteadiness" present    Social History  Substance Use Topics  . Smoking status: Never Smoker  . Smokeless tobacco: Never Used  . Alcohol use No   Objective:   BP 130/62 (BP Location: Right Arm, Patient Position: Sitting, Cuff Size: Large)   Pulse 76   Temp 97.3 F (36.3 C) (Oral)   Resp 16   Wt 168 lb (76.2 kg)   BMI 30.73 kg/m  Vitals:   05/26/16 1355  BP: 130/62  Pulse: 76  Resp: 16  Temp: 97.3 F (36.3 C)  TempSrc: Oral  Weight: 168 lb (76.2 kg)     Physical Exam  Constitutional: She appears well-developed and well-nourished.  HENT:  Head: Normocephalic and atraumatic.  Eyes: Conjunctivae are normal.  Neck: Normal range of motion. No thyromegaly present.  Cardiovascular: Normal rate, regular rhythm and normal heart sounds.   Pulmonary/Chest: Effort normal and breath sounds normal. No respiratory distress.  Abdominal: Soft.  Musculoskeletal: She exhibits edema.  Gait normal.  Skin: Skin is warm and dry.  Pacemaker present  Psychiatric: She has a normal mood and affect. Her behavior is normal. Judgment and thought content normal.        Assessment & Plan:     1. Type 2 diabetes  mellitus without complication, without long-term current use of insulin (HCC) Stable. Continue current medications and plan of care. - POCT glycosylated hemoglobin (Hb A1C) Results for orders placed or performed in visit on 05/26/16  POCT glycosylated hemoglobin (Hb A1C)  Result Value Ref Range   Hemoglobin A1C 7.5    Est. average glucose Bld gHb Est-mCnc 169      2. Essential hypertension Stable. Continue medications. Check labs, and FU pending results. - CBC with Differential/Platelet - Comprehensive metabolic panel  3. Pure hypercholesterolemia Check labs. FU pending results. - Lipid panel - TSH  4. S/P placement of cardiac pacemaker Stable. F/B cardiology.     Patient seen and examined by Miguel Aschoff, MD, and note scribed by Renaldo Fiddler, CMA. I have done the exam and reviewed the chart and it is accurate to the best of my knowledge. Development worker, community has been used and  any errors in dictation or transcription are unintentional. Miguel Aschoff M.D. [facility]  [name], MD

## 2016-05-27 DIAGNOSIS — I10 Essential (primary) hypertension: Secondary | ICD-10-CM | POA: Diagnosis not present

## 2016-05-27 DIAGNOSIS — E7800 Pure hypercholesterolemia, unspecified: Secondary | ICD-10-CM | POA: Diagnosis not present

## 2016-05-28 LAB — CBC WITH DIFFERENTIAL/PLATELET
BASOS: 0 %
Basophils Absolute: 0 10*3/uL (ref 0.0–0.2)
EOS (ABSOLUTE): 0.1 10*3/uL (ref 0.0–0.4)
EOS: 1 %
HEMATOCRIT: 38.3 % (ref 34.0–46.6)
HEMOGLOBIN: 13 g/dL (ref 11.1–15.9)
IMMATURE GRANS (ABS): 0 10*3/uL (ref 0.0–0.1)
IMMATURE GRANULOCYTES: 0 %
LYMPHS: 34 %
Lymphocytes Absolute: 2.4 10*3/uL (ref 0.7–3.1)
MCH: 30.4 pg (ref 26.6–33.0)
MCHC: 33.9 g/dL (ref 31.5–35.7)
MCV: 90 fL (ref 79–97)
MONOCYTES: 10 %
MONOS ABS: 0.7 10*3/uL (ref 0.1–0.9)
NEUTROS PCT: 55 %
Neutrophils Absolute: 3.8 10*3/uL (ref 1.4–7.0)
Platelets: 220 10*3/uL (ref 150–379)
RBC: 4.27 x10E6/uL (ref 3.77–5.28)
RDW: 14.2 % (ref 12.3–15.4)
WBC: 7.1 10*3/uL (ref 3.4–10.8)

## 2016-05-28 LAB — COMPREHENSIVE METABOLIC PANEL
ALK PHOS: 82 IU/L (ref 39–117)
ALT: 15 IU/L (ref 0–32)
AST: 14 IU/L (ref 0–40)
Albumin/Globulin Ratio: 1.7 (ref 1.2–2.2)
Albumin: 3.9 g/dL (ref 3.5–4.7)
BUN/Creatinine Ratio: 26 (ref 12–28)
BUN: 18 mg/dL (ref 8–27)
Bilirubin Total: 0.3 mg/dL (ref 0.0–1.2)
CALCIUM: 9 mg/dL (ref 8.7–10.3)
CO2: 26 mmol/L (ref 18–29)
CREATININE: 0.7 mg/dL (ref 0.57–1.00)
Chloride: 97 mmol/L (ref 96–106)
GFR calc Af Amer: 91 mL/min/{1.73_m2} (ref >59)
GFR, EST NON AFRICAN AMERICAN: 79 mL/min/{1.73_m2} (ref >59)
GLOBULIN, TOTAL: 2.3 g/dL (ref 1.5–4.5)
Glucose: 146 mg/dL — ABNORMAL HIGH (ref 65–99)
POTASSIUM: 4.6 mmol/L (ref 3.5–5.2)
SODIUM: 138 mmol/L (ref 134–144)
Total Protein: 6.2 g/dL (ref 6.0–8.5)

## 2016-05-28 LAB — TSH: TSH: 3.15 u[IU]/mL (ref 0.450–4.500)

## 2016-05-28 LAB — LIPID PANEL
CHOLESTEROL TOTAL: 148 mg/dL (ref 100–199)
Chol/HDL Ratio: 2.2 ratio (ref 0.0–4.4)
HDL: 67 mg/dL (ref >39)
LDL CALC: 61 mg/dL (ref 0–99)
TRIGLYCERIDES: 101 mg/dL (ref 0–149)
VLDL Cholesterol Cal: 20 mg/dL (ref 5–40)

## 2016-06-05 ENCOUNTER — Other Ambulatory Visit: Payer: Self-pay | Admitting: Family Medicine

## 2016-06-05 DIAGNOSIS — M1990 Unspecified osteoarthritis, unspecified site: Secondary | ICD-10-CM

## 2016-06-24 DIAGNOSIS — E119 Type 2 diabetes mellitus without complications: Secondary | ICD-10-CM | POA: Diagnosis not present

## 2016-06-24 LAB — HM DIABETES EYE EXAM

## 2016-07-10 ENCOUNTER — Encounter: Payer: Self-pay | Admitting: Physician Assistant

## 2016-07-10 ENCOUNTER — Ambulatory Visit
Admission: RE | Admit: 2016-07-10 | Discharge: 2016-07-10 | Disposition: A | Discharge status: Home / Self Care | Payer: Medicare Other | Source: Ambulatory Visit | Attending: Physician Assistant | Admitting: Physician Assistant

## 2016-07-10 ENCOUNTER — Ambulatory Visit (INDEPENDENT_AMBULATORY_CARE_PROVIDER_SITE_OTHER): Payer: Medicare Other | Admitting: Physician Assistant

## 2016-07-10 VITALS — BP 150/80 | HR 81 | Temp 97.9°F | Resp 16 | Wt 171.0 lb

## 2016-07-10 DIAGNOSIS — M545 Low back pain, unspecified: Secondary | ICD-10-CM

## 2016-07-10 DIAGNOSIS — M47896 Other spondylosis, lumbar region: Secondary | ICD-10-CM | POA: Insufficient documentation

## 2016-07-10 DIAGNOSIS — W19XXXA Unspecified fall, initial encounter: Secondary | ICD-10-CM

## 2016-07-10 DIAGNOSIS — M25552 Pain in left hip: Secondary | ICD-10-CM

## 2016-07-10 DIAGNOSIS — M1612 Unilateral primary osteoarthritis, left hip: Secondary | ICD-10-CM | POA: Diagnosis not present

## 2016-07-10 DIAGNOSIS — M1611 Unilateral primary osteoarthritis, right hip: Secondary | ICD-10-CM | POA: Diagnosis not present

## 2016-07-10 DIAGNOSIS — M25551 Pain in right hip: Secondary | ICD-10-CM

## 2016-07-10 NOTE — Progress Notes (Signed)
Patient: Taylor Snyder Female    DOB: 01/24/1929   83 y.o.   MRN: [id number] Visit Date: 07/10/2016  Today's Provider: Trinna Post, PA-C   Chief Complaint  Patient presents with  . Fall   Subjective:    Fall  Incident onset: 5 days ago. The fall occurred while walking (pt was walking with hands full and stepped up on a stair, and lost her balance). She landed on concrete. There was no blood loss. The point of impact was the buttocks (and left side). Pain location: lower back, left hip. The pain is at a severity of 5/10 (pain can get as high as an 8/10). Exacerbated by: certain position. Associated symptoms include tingling (on left hip; point of impact ). Pertinent negatives include no abdominal pain, bowel incontinence, fever, headaches, hearing loss, hematuria, loss of consciousness, nausea, numbness, visual change or vomiting. Treatments tried: husband's hydrocodone, APAP, and Biofreeze. The treatment provided significant relief.  This is the first time pt fell this year.    Allergies  Allergen Reactions  . Codeine Sulfate Other (See Comments)  . Codeine Other (See Comments)    Other Reaction: insomnia Unable to sleep, hypes her up     Current Outpatient Prescriptions:  .  ACCU-CHEK COMPACT PLUS test strip, USE AS DIRECTED ONCE DAILY, Disp: 100 each, Rfl: 12 .  ADVAIR DISKUS 100-50 MCG/DOSE AEPB, INHALE 1 PUFF INTO THE LUNGS TWICE DAILY, Disp: 3 each, Rfl: 3 .  Calcium Carbonate-Vitamin D 600-400 MG-UNIT per tablet, Take by mouth., Disp: , Rfl:  .  cetirizine (ZYRTEC) 10 MG tablet, Take by mouth., Disp: , Rfl:  .  Cholecalciferol (VITAMIN D3) 2000 units TABS, Take by mouth., Disp: , Rfl:  .  enalapril (VASOTEC) 5 MG tablet, TAKE ONE TABLET BY MOUTH EVERY DAY., Disp: 90 tablet, Rfl: 3 .  ferrous sulfate 324 (65 Fe) MG TBEC, TAKE 1 TABLET BY MOUTH TWICE DAILY, Disp: 60 tablet, Rfl: 11 .  fluticasone (FLONASE) 50 MCG/ACT nasal spray, USE 2 SPRAYS IN EACH NOSTRIL ONCE  DAILY, Disp: 16 g, Rfl: 12 .  metFORMIN (GLUCOPHAGE) 500 MG tablet, TAKE 2 TABLETS BY MOUTH EVERY DAY, Disp: 60 tablet, Rfl: 11 .  Multiple Vitamins-Minerals (CENTRUM SILVER) tablet, Take by mouth., Disp: , Rfl:  .  omeprazole (PRILOSEC) 20 MG capsule, TAKE 1 CAPSULE(20 MG) BY MOUTH DAILY, Disp: 30 capsule, Rfl: 0 .  pravastatin (PRAVACHOL) 40 MG tablet, TAKE ONE TABLET BY MOUTH NIGHTLY AT BEDTIME, Disp: 30 tablet, Rfl: 11 .  raloxifene (EVISTA) 60 MG tablet, TAKE ONE TABLET BY MOUTH EVERY DAY, Disp: 30 tablet, Rfl: 11 .  XARELTO 20 MG TABS tablet, TAKE 1 TABLET BY MOUTH EVERY DAY, Disp: 90 tablet, Rfl: 3  Review of Systems  Constitutional: Negative for fever.  Gastrointestinal: Negative for abdominal pain, bowel incontinence, nausea and vomiting.  Genitourinary: Negative for hematuria.  Neurological: Positive for tingling (on left hip; point of impact ). Negative for loss of consciousness, numbness and headaches.    Social History  Substance Use Topics  . Smoking status: Never Smoker  . Smokeless tobacco: Never Used  . Alcohol use No   Objective:   BP (!) 150/80 (BP Location: Left Arm, Cuff Size: Large)   Pulse 81   Temp 97.9 F (36.6 C) (Oral)   Resp 16   Wt 171 lb (77.6 kg)   SpO2 95%   BMI 31.28 kg/m  Vitals:   07/10/16 1609 07/10/16 1617  BP: Marland Kitchen)  164/80 (!) 150/80  Pulse: 81   Resp: 16   Temp: 97.9 F (36.6 C)   TempSrc: Oral   SpO2: 95%   Weight: 171 lb (77.6 kg)      Physical Exam  Constitutional: She is oriented to person, place, and time. She appears well-developed and well-nourished. No distress.  HENT:  Head: Normocephalic and atraumatic.  Cardiovascular:  Good cap refill in bilateral lower extremities.  Abdominal: Soft. Bowel sounds are normal. She exhibits no distension. There is no tenderness. There is no rebound and no guarding.  Musculoskeletal: She exhibits tenderness. She exhibits no edema or deformity.  Tenderness along lumbar spine. No bony  tenderness, but tenderness of paraspinal muscles. Legs equal length. No external rotation or abduction bilaterally.   Neurological: She is alert and oriented to person, place, and time. She has normal reflexes.  Patient ambulating with assistance of cane, which she uses on/off for bursitis, not because of fall.  Skin: Skin is warm and dry.     Psychiatric: She has a normal mood and affect. Her behavior is normal.        Assessment & Plan:     1. Fall, initial encounter  Patient is ambulating with cane today. Has bore weight since injury on Sunday, having residual pain. Will get xrays as below. Large hematoma on right buttock but no other evidence of bleeding.   - DG Lumbar Spine Complete; Future - DG HIPS BILAT WITH PELVIS 3-4 VIEWS; Future  2. Pain of both hip joints  Taking Tylenol. Knows she cannot take NSAID.   - DG HIPS BILAT WITH PELVIS 3-4 VIEWS; Future  3. Acute low back pain without sciatica, unspecified back pain laterality  - DG Lumbar Spine Complete; Future  Return if symptoms worsen or fail to improve.  The entirety of the information documented in the History of Present Illness, Review of Systems and Physical Exam were personally obtained by me. Portions of this information were initially documented by Raquel Sarna D. and reviewed by me for thoroughness and accuracy.           Trinna Post, PA-C  [facility]

## 2016-07-14 ENCOUNTER — Telehealth: Payer: Self-pay

## 2016-07-14 NOTE — Telephone Encounter (Signed)
Patient was advised of both reports she states that she is feeling sore from sitting today but has noticed  Improvement as each day passes. Patient was advised to call office back if symptoms worsened. KW

## 2016-07-14 NOTE — Telephone Encounter (Signed)
-----   Message from Trinna Post, [place] sent at 07/14/2016  8:35 AM EDT ----- Do not see sign of broken hip.

## 2016-07-14 NOTE — Telephone Encounter (Signed)
-----   Message from Trinna Post, [place] sent at 07/14/2016  8:37 AM EDT ----- There are degenerative age related changes of spine. There is an L2 vertebral compression deformity, "age indeterminate." This may have happened because of the fall or may have happened prior. Not a whole lot of aggressive intervention for this. Activity as tolerated and pain medication as needed. Please follow up if not continuing to improve. How is she feeling today?

## 2016-07-14 NOTE — Telephone Encounter (Signed)
Unable to reach patient at this time, home line is busy. KW

## 2016-07-16 ENCOUNTER — Other Ambulatory Visit: Payer: Self-pay | Admitting: Family Medicine

## 2016-07-16 DIAGNOSIS — M1990 Unspecified osteoarthritis, unspecified site: Secondary | ICD-10-CM

## 2016-07-16 NOTE — Telephone Encounter (Signed)
Please review for dr [email or web address]-aa 

## 2016-08-06 DIAGNOSIS — I480 Paroxysmal atrial fibrillation: Secondary | ICD-10-CM | POA: Diagnosis not present

## 2016-08-06 DIAGNOSIS — R0602 Shortness of breath: Secondary | ICD-10-CM | POA: Diagnosis not present

## 2016-08-06 DIAGNOSIS — I495 Sick sinus syndrome: Secondary | ICD-10-CM | POA: Diagnosis not present

## 2016-08-12 ENCOUNTER — Ambulatory Visit
Admission: RE | Admit: 2016-08-12 | Discharge: 2016-08-12 | Disposition: A | Discharge status: Home / Self Care | Payer: Medicare Other | Source: Ambulatory Visit | Attending: Cardiology | Admitting: Cardiology

## 2016-08-12 ENCOUNTER — Encounter
Admission: RE | Admit: 2016-08-12 | Discharge: 2016-08-12 | Disposition: A | Discharge status: Home / Self Care | Payer: Medicare Other | Source: Ambulatory Visit | Attending: Cardiology | Admitting: Cardiology

## 2016-08-12 DIAGNOSIS — Z01810 Encounter for preprocedural cardiovascular examination: Secondary | ICD-10-CM | POA: Diagnosis not present

## 2016-08-12 DIAGNOSIS — J449 Chronic obstructive pulmonary disease, unspecified: Secondary | ICD-10-CM

## 2016-08-12 DIAGNOSIS — D649 Anemia, unspecified: Secondary | ICD-10-CM | POA: Insufficient documentation

## 2016-08-12 DIAGNOSIS — E119 Type 2 diabetes mellitus without complications: Secondary | ICD-10-CM | POA: Insufficient documentation

## 2016-08-12 DIAGNOSIS — I4891 Unspecified atrial fibrillation: Secondary | ICD-10-CM | POA: Diagnosis not present

## 2016-08-12 DIAGNOSIS — Z01812 Encounter for preprocedural laboratory examination: Secondary | ICD-10-CM | POA: Insufficient documentation

## 2016-08-12 DIAGNOSIS — I480 Paroxysmal atrial fibrillation: Secondary | ICD-10-CM | POA: Diagnosis not present

## 2016-08-12 DIAGNOSIS — I10 Essential (primary) hypertension: Secondary | ICD-10-CM | POA: Diagnosis not present

## 2016-08-12 DIAGNOSIS — E669 Obesity, unspecified: Secondary | ICD-10-CM | POA: Insufficient documentation

## 2016-08-12 DIAGNOSIS — I058 Other rheumatic mitral valve diseases: Secondary | ICD-10-CM | POA: Insufficient documentation

## 2016-08-12 DIAGNOSIS — Z950 Presence of cardiac pacemaker: Secondary | ICD-10-CM | POA: Diagnosis not present

## 2016-08-12 DIAGNOSIS — I700 Atherosclerosis of aorta: Secondary | ICD-10-CM | POA: Insufficient documentation

## 2016-08-12 DIAGNOSIS — R918 Other nonspecific abnormal finding of lung field: Secondary | ICD-10-CM | POA: Diagnosis not present

## 2016-08-12 DIAGNOSIS — Z01818 Encounter for other preprocedural examination: Secondary | ICD-10-CM | POA: Diagnosis not present

## 2016-08-12 HISTORY — DX: Unspecified asthma, uncomplicated: J45.909

## 2016-08-12 LAB — BASIC METABOLIC PANEL
ANION GAP: 7 (ref 5–15)
BUN: 13 mg/dL (ref 6–20)
CO2: 27 mmol/L (ref 22–32)
Calcium: 9.2 mg/dL (ref 8.9–10.3)
Chloride: 95 mmol/L — ABNORMAL LOW (ref 101–111)
Creatinine, Ser: 0.63 mg/dL (ref 0.44–1.00)
GFR calc Af Amer: >60 mL/min (ref >60)
GFR calc non Af Amer: >60 mL/min (ref >60)
GLUCOSE: 133 mg/dL — AB (ref 65–99)
POTASSIUM: 4.1 mmol/L (ref 3.5–5.1)
Sodium: 129 mmol/L — ABNORMAL LOW (ref 135–145)

## 2016-08-12 LAB — PROTIME-INR
INR: 1.08
Prothrombin Time: 14 seconds (ref 11.4–15.2)

## 2016-08-12 LAB — SURGICAL PCR SCREEN
MRSA, PCR: NEGATIVE
STAPHYLOCOCCUS AUREUS: NEGATIVE

## 2016-08-12 LAB — CBC
HEMATOCRIT: 39.1 % (ref 35.0–47.0)
HEMOGLOBIN: 13 g/dL (ref 12.0–16.0)
MCH: 30.5 pg (ref 26.0–34.0)
MCHC: 33.3 g/dL (ref 32.0–36.0)
MCV: 91.5 fL (ref 80.0–100.0)
Platelets: 211 10*3/uL (ref 150–440)
RBC: 4.27 MIL/uL (ref 3.80–5.20)
RDW: 15 % — ABNORMAL HIGH (ref 11.5–14.5)
WBC: 6.1 10*3/uL (ref 3.6–11.0)

## 2016-08-12 LAB — APTT: APTT: 34 s (ref 24–36)

## 2016-08-12 NOTE — Patient Instructions (Signed)
  Your procedure is scheduled YN:WGNFAO August 21, 2016. Report to Same Day Surgery. To find out your arrival time please call [phone] between 1PM - 3PM on Thursday August 20, 2016.  Remember: Instructions that are not followed completely may result in serious medical risk, up to and including death, or upon the discretion of your surgeon and anesthesiologist your surgery may need to be rescheduled.    _x___ 1. Do not eat food or drink liquids after midnight. No gum chewing or hard candies.     ____ 2. No Alcohol for 24 hours before or after surgery.   ____ 3. Bring all medications with you on the day of surgery if instructed.    __x__ 4. Notify your doctor if there is any change in your medical condition     (cold, fever, infections).    _____ 5. No smoking 24 hours prior to surgery.     Do not wear jewelry, make-up, hairpins, clips or nail polish.  Do not wear lotions, powders, or perfumes.   Do not shave 48 hours prior to surgery. Men may shave face and neck.  Do not bring valuables to the hospital.    [facility] is not responsible for any belongings or valuables.               Contacts, dentures or bridgework may not be worn into surgery.  Leave your suitcase in the car. After surgery it may be brought to your room.  For patients admitted to the hospital, discharge time is determined by your treatment team.   Patients discharged the day of surgery will not be allowed to drive home.    Please read over the following fact sheets that you were given:   [facility] Preparing for Surgery  __x__ Take these medicines the morning of surgery with A SIP OF WATER:    1. enalapril (VASOTEC)  2. omeprazole (PRILOSEC)   ____ Fleet Enema (as directed)   _x_ Use CHG Soap as directed on instruction sheet  _x_ Use inhalers on the day of surgery and bring to hospital day of surgery  _x___ Stop metformin 2 days prior to surgery, on July 4th, 2018.    ____ Take 1/2 of usual insulin  dose the night before surgery and none on the morning of surgery.   _x__ Stop Xarelto 5 days prior to surgery per Dr. Marcello Moores' instructions.   _x__ Stop Anti-inflammatories such as Advil, Aleve, Ibuprofen, Motrin, Naproxen,  Naprosyn, Goodies powders or aspirin  products. OK to take Tylenol.   ____ Stop supplements until after surgery.    ____ Bring C-Pap to the hospital.

## 2016-08-14 ENCOUNTER — Other Ambulatory Visit: Payer: Self-pay | Admitting: Family Medicine

## 2016-08-14 DIAGNOSIS — M1990 Unspecified osteoarthritis, unspecified site: Secondary | ICD-10-CM

## 2016-08-20 MED ORDER — CEFAZOLIN SODIUM-DEXTROSE 1-4 GM/50ML-% IV SOLN
1.0000 g | Freq: Once | INTRAVENOUS | Status: AC
  Administered 2016-08-21: 1 g via INTRAVENOUS

## 2016-08-21 ENCOUNTER — Encounter
Admission: RE | Disposition: A | Discharge status: Home / Self Care | Payer: Self-pay | Source: Ambulatory Visit | Attending: Cardiology

## 2016-08-21 ENCOUNTER — Ambulatory Visit: Payer: Medicare Other | Admitting: Anesthesiology

## 2016-08-21 ENCOUNTER — Ambulatory Visit
Admission: RE | Admit: 2016-08-21 | Discharge: 2016-08-21 | Disposition: A | Discharge status: Home / Self Care | Payer: Medicare Other | Source: Ambulatory Visit | Attending: Cardiology | Admitting: Cardiology

## 2016-08-21 ENCOUNTER — Encounter: Payer: Self-pay | Admitting: *Deleted

## 2016-08-21 DIAGNOSIS — Z45010 Encounter for checking and testing of cardiac pacemaker pulse generator [battery]: Secondary | ICD-10-CM | POA: Insufficient documentation

## 2016-08-21 DIAGNOSIS — I495 Sick sinus syndrome: Secondary | ICD-10-CM | POA: Diagnosis not present

## 2016-08-21 DIAGNOSIS — Z7901 Long term (current) use of anticoagulants: Secondary | ICD-10-CM | POA: Diagnosis not present

## 2016-08-21 DIAGNOSIS — Z7984 Long term (current) use of oral hypoglycemic drugs: Secondary | ICD-10-CM | POA: Diagnosis not present

## 2016-08-21 DIAGNOSIS — Z8249 Family history of ischemic heart disease and other diseases of the circulatory system: Secondary | ICD-10-CM | POA: Diagnosis not present

## 2016-08-21 DIAGNOSIS — Z823 Family history of stroke: Secondary | ICD-10-CM | POA: Insufficient documentation

## 2016-08-21 DIAGNOSIS — I480 Paroxysmal atrial fibrillation: Secondary | ICD-10-CM | POA: Diagnosis not present

## 2016-08-21 DIAGNOSIS — J449 Chronic obstructive pulmonary disease, unspecified: Secondary | ICD-10-CM | POA: Insufficient documentation

## 2016-08-21 DIAGNOSIS — I10 Essential (primary) hypertension: Secondary | ICD-10-CM | POA: Diagnosis not present

## 2016-08-21 DIAGNOSIS — Z79899 Other long term (current) drug therapy: Secondary | ICD-10-CM | POA: Diagnosis not present

## 2016-08-21 DIAGNOSIS — E669 Obesity, unspecified: Secondary | ICD-10-CM | POA: Diagnosis not present

## 2016-08-21 DIAGNOSIS — K219 Gastro-esophageal reflux disease without esophagitis: Secondary | ICD-10-CM | POA: Diagnosis not present

## 2016-08-21 DIAGNOSIS — Z6829 Body mass index (BMI) 29.0-29.9, adult: Secondary | ICD-10-CM | POA: Insufficient documentation

## 2016-08-21 DIAGNOSIS — Z45018 Encounter for adjustment and management of other part of cardiac pacemaker: Secondary | ICD-10-CM | POA: Diagnosis not present

## 2016-08-21 DIAGNOSIS — E119 Type 2 diabetes mellitus without complications: Secondary | ICD-10-CM | POA: Insufficient documentation

## 2016-08-21 DIAGNOSIS — E785 Hyperlipidemia, unspecified: Secondary | ICD-10-CM | POA: Insufficient documentation

## 2016-08-21 DIAGNOSIS — D649 Anemia, unspecified: Secondary | ICD-10-CM | POA: Insufficient documentation

## 2016-08-21 HISTORY — PX: PACEMAKER INSERTION: SHX728

## 2016-08-21 LAB — GLUCOSE, CAPILLARY
Glucose-Capillary: 154 mg/dL — ABNORMAL HIGH (ref 65–99)
Glucose-Capillary: 131 mg/dL — ABNORMAL HIGH (ref 65–99)

## 2016-08-21 SURGERY — INSERTION, CARDIAC PACEMAKER
Anesthesia: General | Site: Chest | Laterality: Left | Wound class: Clean

## 2016-08-21 MED ORDER — CEFAZOLIN SODIUM-DEXTROSE 1-4 GM/50ML-% IV SOLN
INTRAVENOUS | Status: AC
  Filled 2016-08-21: qty 50

## 2016-08-21 MED ORDER — ACETAMINOPHEN ER 650 MG PO TBCR: 650.0000 mg | EXTENDED_RELEASE_TABLET | Freq: Every day | ORAL | Status: DC | PRN

## 2016-08-21 MED ORDER — LIDOCAINE 2% (20 MG/ML) 5 ML SYRINGE
INTRAMUSCULAR | Status: DC | PRN
  Administered 2016-08-21: 25 mg via INTRAVENOUS

## 2016-08-21 MED ORDER — PROPOFOL 10 MG/ML IV BOLUS
INTRAVENOUS | Status: AC
  Filled 2016-08-21: qty 20

## 2016-08-21 MED ORDER — PROPOFOL 10 MG/ML IV BOLUS
INTRAVENOUS | Status: DC | PRN
  Administered 2016-08-21 (×2): 20 mg via INTRAVENOUS

## 2016-08-21 MED ORDER — PROPOFOL 500 MG/50ML IV EMUL
INTRAVENOUS | Status: DC | PRN
  Administered 2016-08-21: 25 ug/kg/min via INTRAVENOUS

## 2016-08-21 MED ORDER — FENTANYL CITRATE (PF) 100 MCG/2ML IJ SOLN
INTRAMUSCULAR | Status: DC | PRN
  Administered 2016-08-21 (×4): 25 ug via INTRAVENOUS

## 2016-08-21 MED ORDER — LIDOCAINE HCL (PF) 2 % IJ SOLN
INTRAMUSCULAR | Status: AC
  Filled 2016-08-21: qty 2

## 2016-08-21 MED ORDER — LIDOCAINE 1 % OPTIME INJ - NO CHARGE
INTRAMUSCULAR | Status: DC | PRN
  Administered 2016-08-21: 10 mL

## 2016-08-21 MED ORDER — FENTANYL CITRATE (PF) 100 MCG/2ML IJ SOLN
INTRAMUSCULAR | Status: AC
  Filled 2016-08-21: qty 2

## 2016-08-21 MED ORDER — ENALAPRIL MALEATE 5 MG PO TABS: 5.0000 mg | ORAL_TABLET | Freq: Every day | ORAL | Status: DC

## 2016-08-21 MED ORDER — FLUTICASONE PROPIONATE 50 MCG/ACT NA SUSP: 2.0000 | Freq: Every day | NASAL | Status: DC

## 2016-08-21 MED ORDER — FENTANYL CITRATE (PF) 100 MCG/2ML IJ SOLN: 25.0000 ug | INTRAMUSCULAR | Status: DC | PRN

## 2016-08-21 MED ORDER — CENTRUM SILVER PO TABS: 1.0000 | ORAL_TABLET | Freq: Every day | ORAL | Status: DC

## 2016-08-21 MED ORDER — RALOXIFENE HCL 60 MG PO TABS: 60.0000 mg | ORAL_TABLET | Freq: Every day | ORAL | Status: DC

## 2016-08-21 MED ORDER — VITAMIN D3 50 MCG (2000 UT) PO TABS: 2000.0000 [IU] | ORAL_TABLET | Freq: Every morning | ORAL | Status: DC

## 2016-08-21 MED ORDER — HEPARIN SODIUM (PORCINE) 5000 UNIT/ML IJ SOLN
INTRAMUSCULAR | Status: AC
  Filled 2016-08-21: qty 1

## 2016-08-21 MED ORDER — GLUCOSE BLOOD VI STRP: 1.0000 | ORAL_STRIP | Freq: Every day | Status: DC

## 2016-08-21 MED ORDER — FERROUS SULFATE 324 (65 FE) MG PO TBEC: 1.0000 | DELAYED_RELEASE_TABLET | Freq: Two times a day (BID) | ORAL | Status: DC

## 2016-08-21 MED ORDER — SODIUM CHLORIDE 0.9 % IV SOLN
INTRAVENOUS | Status: DC
  Administered 2016-08-21: 12:00:00 via INTRAVENOUS

## 2016-08-21 MED ORDER — PRAVASTATIN SODIUM 40 MG PO TABS: 40.0000 mg | ORAL_TABLET | Freq: Every day | ORAL | Status: DC

## 2016-08-21 MED ORDER — GENTAMICIN SULFATE 40 MG/ML IJ SOLN
INTRAMUSCULAR | Status: AC
  Filled 2016-08-21: qty 2

## 2016-08-21 MED ORDER — GENTAMICIN SULFATE 40 MG/ML IJ SOLN
Freq: Once | INTRAMUSCULAR | Status: AC
  Administered 2016-08-21: 14:00:00
  Filled 2016-08-21: qty 2

## 2016-08-21 MED ORDER — CALCIUM CARBONATE-VITAMIN D 600-400 MG-UNIT PO TABS: 1.0000 | ORAL_TABLET | Freq: Every day | ORAL | Status: DC

## 2016-08-21 MED ORDER — RIVAROXABAN 20 MG PO TABS: 20.0000 mg | ORAL_TABLET | Freq: Every day | ORAL | Status: DC

## 2016-08-21 MED ORDER — METFORMIN HCL 500 MG PO TABS
500.0000 mg | ORAL_TABLET | Freq: Two times a day (BID) | ORAL | Status: DC
Start: 2016-08-21 — End: 2016-08-21

## 2016-08-21 SURGICAL SUPPLY — 43 items
BAG DECANTER FOR FLEXI CONT (MISCELLANEOUS) IMPLANT
BLADE SURG SZ10 CARB STEEL (BLADE) ×2 IMPLANT
BRUSH SCRUB 4% CHG (MISCELLANEOUS) ×2 IMPLANT
CABLE SURG 12 DISP A/V CHANNEL (MISCELLANEOUS) IMPLANT
CANISTER SUCT 1200ML W/VALVE (MISCELLANEOUS) ×2 IMPLANT
CHLORAPREP W/TINT 26ML (MISCELLANEOUS) ×2 IMPLANT
COVER LIGHT HANDLE STERIS (MISCELLANEOUS) ×4 IMPLANT
COVER MAYO STAND STRL (DRAPES) ×2 IMPLANT
DRAPE C-ARM XRAY 36X54 (DRAPES) IMPLANT
DRSG TEGADERM 4X4.75 (GAUZE/BANDAGES/DRESSINGS) ×2 IMPLANT
DRSG TELFA 4X3 1S NADH ST (GAUZE/BANDAGES/DRESSINGS) ×2 IMPLANT
ELECT REM PT RETURN 9FT ADLT (ELECTROSURGICAL) ×2
ELECTRODE REM PT RTRN 9FT ADLT (ELECTROSURGICAL) ×1 IMPLANT
GLOVE BIO SURGEON STRL SZ7.5 (GLOVE) ×2 IMPLANT
GOWN STRL REUS W/ TWL LRG LVL3 (GOWN DISPOSABLE) ×1 IMPLANT
GOWN STRL REUS W/TWL LRG LVL3 (GOWN DISPOSABLE) ×1
IMMOBILIZER SHDR MD LX WHT (SOFTGOODS) IMPLANT
IPG PACE AZUR XT DR MRI W1DR01 (Pacemaker) ×1 IMPLANT
IV NS 1000ML (IV SOLUTION) ×1
IV NS 1000ML BAXH (IV SOLUTION) ×1 IMPLANT
KIT RM TURNOVER STRD PROC AR (KITS) ×2 IMPLANT
LABEL OR SOLS (LABEL) IMPLANT
LEAD INTRODUCER 7FR 23CM (INTRODUCER) IMPLANT
MARKER SKIN DUAL TIP RULER LAB (MISCELLANEOUS) ×2 IMPLANT
NDL SAFETY 18GX1.5 (NEEDLE) ×2 IMPLANT
NDL SAFETY ECLIPSE 18X1.5 (NEEDLE) ×2 IMPLANT
NEEDLE HYPO 18GX1.5 SHARP (NEEDLE) ×2
NEEDLE HYPO 25X1 1.5 SAFETY (NEEDLE) ×2 IMPLANT
NEEDLE SPNL 18GX3.5 QUINCKE PK (NEEDLE) ×2 IMPLANT
NS IRRIG 500ML POUR BTL (IV SOLUTION) ×2 IMPLANT
PACE AZURE XT DR MRI W1DR01 (Pacemaker) ×2 IMPLANT
PACK PACE INSERTION (MISCELLANEOUS) ×2 IMPLANT
PAD STATPAD (MISCELLANEOUS) ×2 IMPLANT
SLING ARM LRG DEEP (SOFTGOODS) ×2 IMPLANT
STRIP CLOSURE SKIN 1/2X4 (GAUZE/BANDAGES/DRESSINGS) IMPLANT
SUT DVC V-LOC 4-0 90 CLR P-12 (SUTURE) ×2
SUT DVC VLOC 3-0 CL 6 P-12 (SUTURE) ×2 IMPLANT
SUT SILK 0 SH 30 (SUTURE) ×2 IMPLANT
SUT VIC AB 3-0 SH 27 (SUTURE)
SUT VIC AB 3-0 SH 27X BRD (SUTURE) IMPLANT
SUT VIC AB 4-0 PS2 18 (SUTURE) IMPLANT
SUTURE DVC V-LC4-0 90 CLR P-12 (SUTURE) ×1 IMPLANT
SYR 3ML LL SCALE MARK (SYRINGE) ×4 IMPLANT

## 2016-08-21 NOTE — OR Nursing (Signed)
MD's day of surgery note is under plan of care, under the chart review tab. Visualized by Threasa Heads, RN and Denna Haggard, RN.

## 2016-08-21 NOTE — Plan of Care (Signed)
88/ y/o with symptomatic bradycardia with dual chamber pacemaker, Device at [facility] here for dual chamber pacemaker replacement.   Patient with no complaints in good spirits Lungs CTA CV RRR sem 2/6 LLSB Ext no edema Device in l ic fossa Site well healed  [employer]  Plan dual chamber pacer gen replacement  Hope following 1 hour recovery.   F/u 2 weeks.

## 2016-08-21 NOTE — Discharge Instructions (Signed)

## 2016-08-21 NOTE — Op Note (Signed)
Preoperative diagnosis SSS Postoperative diagnosis same  Procedure: Generator replacement; asessment of pace sense leads  Following informed consent the patient was brought to the electrophysiology laboratory in place of the fluoroscopic table in the supine position after routine prep and drape lidocaine was infiltrated in the region of the previous incision and carried down to later the device pocket using sharp dissection and electrocautery. The pocket was opened the device was freed up and was explanted.  Interrogation of the previously implanted pacemaker ventricular lead 5076 demonstrated an R wave of 8.8 millivolts., and impedance of 790 ohms, and a pacing threshold of 0.5 volts at 0.5 msec.  The previously implanted atrial lead 5076 demonstrated a P-wave amplitude of  5.5 milllivolts and impedance of  488 ohms, and a pacing threshold of  1.4 volts at @ 0.5 milliseconds.  The leads were inspected. Repair was not needed. The leads were then attached to a MDT pulse generator, serial number [id number].     The pocket was irrigated with antibiotic containing saline solution hemostasis was assured and the leads and the device were placed in the pocket. The wound was then closed in 3 layers in normal fashion.  The patient tolerated the procedure without apparent complication.    EBL Minimal   F/u wound check in 2 weeks

## 2016-08-21 NOTE — Transfer of Care (Signed)
Immediate Anesthesia Transfer of Care Note  Patient: Taylor Snyder  Procedure(s) Performed: Procedure(s): PACEMAKER CHANGE OUT (Left)  Patient Location: PACU  Anesthesia Type:General  Level of Consciousness: awake, alert  and oriented  Airway & Oxygen Therapy: Patient Spontanous Breathing  Post-op Assessment: Report given to RN and Post -op Vital signs reviewed and stable  Post vital signs: Reviewed  Last Vitals:  Vitals:   08/21/16 1138 08/21/16 1425  BP: (!) 138/51 131/74  Pulse: 73 70  Resp: 16 15  Temp: 36.7 C 36.5 C    Last Pain:  Vitals:   08/21/16 1138  TempSrc: Oral  PainSc: 4          Complications: No apparent anesthesia complications

## 2016-08-21 NOTE — Anesthesia Preprocedure Evaluation (Signed)
Anesthesia Evaluation  Patient identified by MRN, date of birth, ID band Patient awake    Reviewed: Allergy & Precautions, H&P , NPO status , Patient's Chart, lab work & pertinent test results, reviewed documented beta blocker date and time   History of Anesthesia Complications Negative for: history of anesthetic complications  Airway Mallampati: III  TM Distance: >3 FB Neck ROM: full    Dental  (+) Teeth Intact, Dental Advidsory Given   Pulmonary neg shortness of breath, asthma , neg COPD, neg recent URI,           Cardiovascular Exercise Tolerance: Good hypertension, (-) angina(-) CAD, (-) Past MI, (-) Cardiac Stents and (-) CABG + dysrhythmias Atrial Fibrillation + pacemaker (-) Valvular Problems/Murmurs     Neuro/Psych negative neurological ROS  negative psych ROS   GI/Hepatic Neg liver ROS, GERD  ,  Endo/Other  diabetes  Renal/GU negative Renal ROS  negative genitourinary   Musculoskeletal   Abdominal   Peds  Hematology negative hematology ROS (+)   Anesthesia Other Findings Past Medical History: No date: A-fib ([place]) No date: Anemia No date: Asthma 2005: Colon polyps 2005: Diabetes mellitus without complication (HCC) No date: Hyperlipidemia No date: Hypertension No date: Obesity 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 No date: Unspecified atrial fibrillation (HCC)   Reproductive/Obstetrics negative OB ROS                             Anesthesia Physical Anesthesia Plan  ASA: IV  Anesthesia Plan: General   Post-op Pain Management:    Induction: Intravenous  PONV Risk Score and Plan: 3 and Ondansetron, Dexamethasone and Propofol  Airway Management Planned: Natural Airway and Nasal Cannula  Additional Equipment:   Intra-op Plan:   Post-operative Plan:   Informed Consent: I have reviewed the patients History and Physical, chart, labs and discussed the procedure  including the risks, benefits and alternatives for the proposed anesthesia with the patient or authorized representative who has indicated his/her understanding and acceptance.   Dental Advisory Given  Plan Discussed with: Anesthesiologist, CRNA and Surgeon  Anesthesia Plan Comments:         Anesthesia Quick Evaluation

## 2016-08-21 NOTE — Anesthesia Post-op Follow-up Note (Cosign Needed)
Anesthesia QCDR form completed.        

## 2016-08-23 ENCOUNTER — Encounter: Payer: Self-pay | Admitting: Cardiology

## 2016-08-24 NOTE — Anesthesia Postprocedure Evaluation (Signed)
Anesthesia Post Note  Patient: Taylor Snyder  Procedure(s) Performed: Procedure(s) (LRB): PACEMAKER CHANGE OUT (Left)  Patient location during evaluation: PACU Anesthesia Type: General Level of consciousness: awake and alert Pain management: pain level controlled Vital Signs Assessment: post-procedure vital signs reviewed and stable Respiratory status: spontaneous breathing, nonlabored ventilation, respiratory function stable and patient connected to nasal cannula oxygen Cardiovascular status: blood pressure returned to baseline and stable Postop Assessment: no signs of nausea or vomiting Anesthetic complications: no     Last Vitals:  Vitals:   08/21/16 1515 08/21/16 1542  BP: (!) 150/71 (!) 181/76  Pulse: 77 76  Resp: 20 18  Temp: 36.9 C     Last Pain:  Vitals:   08/24/16 0809  TempSrc:   PainSc: 0-No pain                 Martha Clan

## 2016-08-27 DIAGNOSIS — E119 Type 2 diabetes mellitus without complications: Secondary | ICD-10-CM | POA: Diagnosis not present

## 2016-09-03 DIAGNOSIS — I480 Paroxysmal atrial fibrillation: Secondary | ICD-10-CM | POA: Diagnosis not present

## 2016-09-08 DIAGNOSIS — E782 Mixed hyperlipidemia: Secondary | ICD-10-CM | POA: Diagnosis not present

## 2016-09-08 DIAGNOSIS — I341 Nonrheumatic mitral (valve) prolapse: Secondary | ICD-10-CM | POA: Diagnosis not present

## 2016-09-08 DIAGNOSIS — I10 Essential (primary) hypertension: Secondary | ICD-10-CM | POA: Diagnosis not present

## 2016-09-08 DIAGNOSIS — I480 Paroxysmal atrial fibrillation: Secondary | ICD-10-CM | POA: Diagnosis not present

## 2016-09-09 ENCOUNTER — Other Ambulatory Visit: Payer: Self-pay | Admitting: Family Medicine

## 2016-09-09 DIAGNOSIS — M1990 Unspecified osteoarthritis, unspecified site: Secondary | ICD-10-CM

## 2016-09-10 NOTE — Telephone Encounter (Signed)
Refilled

## 2016-09-11 ENCOUNTER — Other Ambulatory Visit: Payer: No Typology Code available for payment source

## 2016-09-16 DIAGNOSIS — H2511 Age-related nuclear cataract, right eye: Secondary | ICD-10-CM | POA: Diagnosis not present

## 2016-09-21 ENCOUNTER — Encounter: Payer: Self-pay | Admitting: *Deleted

## 2016-09-28 ENCOUNTER — Ambulatory Visit (INDEPENDENT_AMBULATORY_CARE_PROVIDER_SITE_OTHER): Payer: Medicare Other | Admitting: Family Medicine

## 2016-09-28 ENCOUNTER — Encounter: Payer: Self-pay | Admitting: Family Medicine

## 2016-09-28 ENCOUNTER — Ambulatory Visit: Payer: Medicare Other | Admitting: Family Medicine

## 2016-09-28 VITALS — BP 122/60 | HR 66 | Temp 97.3°F | Resp 16 | Wt 171.0 lb

## 2016-09-28 DIAGNOSIS — E119 Type 2 diabetes mellitus without complications: Secondary | ICD-10-CM

## 2016-09-28 DIAGNOSIS — I10 Essential (primary) hypertension: Secondary | ICD-10-CM

## 2016-09-28 LAB — POCT GLYCOSYLATED HEMOGLOBIN (HGB A1C): Hemoglobin A1C: 7.2

## 2016-09-28 NOTE — Progress Notes (Signed)
Subjective:  HPI  Diabetes Mellitus Type II, Follow-up:   Lab Results  Component Value Date   HGBA1C 7.5 05/26/2016   HGBA1C 7.3 01/02/2016   HGBA1C 7.4 04/30/2015    Last seen for diabetes 4 months ago.  Management since then includes none. She reports good compliance with treatment. She is not having side effects.  Home blood sugar records: 150's  Episodes of hypoglycemia? no   Current Insulin Regimen: n/a Most Recent Eye Exam: she is having cataract surgery tomorrow.  Weight trend: stable Current exercise: walking daily when it is not too hot outside.   Pertinent Labs:    Component Value Date/Time   CHOL 148 05/27/2016 0922   TRIG 101 05/27/2016 0922   HDL 67 05/27/2016 0922   LDLCALC 61 05/27/2016 0922   CREATININE 0.63 08/12/2016 1205    Wt Readings from Last 3 Encounters:  09/28/16 171 lb (77.6 kg)  08/21/16 164 lb (74.4 kg)  08/12/16 164 lb (74.4 kg)    ------------------------------------------------------------------------  Pt recently had her pacemaker replaced and is scheduled to have cataract surgery tomorrow.   Prior to Admission medications   Medication Sig Start Date End Date Taking? Authorizing Provider  ACCU-CHEK COMPACT PLUS test strip USE AS DIRECTED ONCE DAILY 04/29/15  Yes Jerrol Banana., MD  ADVAIR DISKUS 100-50 MCG/DOSE AEPB INHALE 1 PUFF INTO THE LUNGS TWICE DAILY 05/06/16  Yes Jerrol Banana., MD  Calcium Carbonate-Vitamin D 600-400 MG-UNIT per tablet Take 1 tablet by mouth daily.    Yes [provider]  cetirizine (ZYRTEC) 10 MG tablet Take 10 mg by mouth at bedtime as needed for allergies.    Yes [provider]  Cholecalciferol (VITAMIN D3) 2000 units TABS Take by mouth.   Yes [provider]  enalapril (VASOTEC) 5 MG tablet TAKE ONE TABLET BY MOUTH EVERY DAY. Patient taking differently: TAKE ONE TABLET BY MOUTH EVERY DAY IN AM 10/07/15  Yes Jerrol Banana., MD  ferrous sulfate 324  (65 Fe) MG TBEC TAKE 1 TABLET BY MOUTH TWICE DAILY 02/05/16  Yes Jerrol Banana., MD  fluticasone [facility]) 50 MCG/ACT nasal spray USE 2 SPRAYS IN EACH NOSTRIL ONCE DAILY Patient taking differently: USE 1 SPRAYS IN EACH NOSTRIL ONCE DAILY AS NEEDED FOR ALLERGIES 12/06/14  Yes Jerrol Banana., MD  metFORMIN (GLUCOPHAGE) 500 MG tablet TAKE 2 TABLETS BY MOUTH EVERY DAY Patient taking differently: TAKE 1 TABLET BY MOUTH TWICE DAILY 10/07/15  Yes Jerrol Banana., MD  Multiple Vitamins-Minerals (CENTRUM SILVER) tablet Take 1 tablet by mouth daily.    Yes [provider]  omeprazole (PRILOSEC) 20 MG capsule TAKE 1 CAPSULE(20 MG) BY MOUTH DAILY 09/10/16  Yes Jerrol Banana., MD  pravastatin (PRAVACHOL) 40 MG tablet TAKE ONE TABLET BY MOUTH NIGHTLY AT BEDTIME 11/13/15  Yes Jerrol Banana., MD  raloxifene (EVISTA) 60 MG tablet TAKE ONE TABLET BY MOUTH EVERY DAY 02/05/16  Yes Jerrol Banana., MD  XARELTO 20 MG TABS tablet TAKE 1 TABLET BY MOUTH EVERY DAY 05/06/16  Yes Jerrol Banana., MD  acetaminophen (TYLENOL 8 HOUR) 650 MG CR tablet Take 650 mg by mouth daily as needed for pain.    [provider]    Patient Active Problem List   Diagnosis Date Noted  . A-fib ([place]) 06/18/2014  . Allergic rhinitis 06/18/2014  . Airway hyperreactivity 06/18/2014  . Anemia due to unknown mechanism 06/18/2014  .  CAFL (chronic airflow limitation) ([place]) 06/18/2014  . Diabetes mellitus, type 2 ([place]) 06/18/2014  . HLD (hyperlipidemia) 06/18/2014  . BP (high blood pressure) 06/18/2014  . History of prolonged Q-T interval on ECG 06/18/2014  . Mitral valve disorder 06/18/2014  . Arthritis, degenerative 06/18/2014  . Adiposity 06/18/2014  . OP (osteoporosis) 06/18/2014  . Benign essential HTN 06/08/2014  . Combined fat and carbohydrate induced hyperlipemia 12/28/2013  . MI (mitral incompetence) 12/28/2013  . Sick sinus syndrome ([place]) 12/28/2013  . Anemia,  iron deficiency 10/18/2013  . Personal history of colonic polyps 08/29/2012  . Colon polyps     Past Medical History:  Diagnosis Date  . A-fib ([place])   . Anemia   . Asthma   . Colon polyps 2005  . Diabetes mellitus without complication ([place]) 9417  . Dysrhythmia   . GERD (gastroesophageal reflux disease)   . HOH (hard of hearing)    AIDS  . Hyperlipidemia   . Hypertension   . Obesity   . Presence of permanent cardiac pacemaker   . Rheumatic mitral valve failure   . Unspecified atrial fibrillation [facility])     Social History   Social History  . Marital status: Married    Spouse name: N/A  . Number of children: N/A  . Years of education: N/A   Occupational History  . Not on file.   Social History Main Topics  . Smoking status: Never Smoker  . Smokeless tobacco: Never Used  . Alcohol use No  . Drug use: No  . Sexual activity: No   Other Topics Concern  . Not on file   Social History Narrative  . No narrative on file    Allergies  Allergen Reactions  . Codeine Sulfate Other (See Comments)    Altered mental status  . Codeine Other (See Comments)    Other Reaction: insomnia Unable to sleep, hypes her up    Review of Systems  Constitutional: Negative.   HENT: Negative.   Eyes: Negative.   Respiratory: Negative.   Cardiovascular: Negative.   Gastrointestinal: Negative.   Genitourinary: Negative.   Musculoskeletal: Negative.   Skin: Negative.   Neurological: Negative.   Endo/Heme/Allergies: Negative.   Psychiatric/Behavioral: Negative.     Immunization History  Administered Date(s) Administered  . Influenza, High Dose Seasonal PF 10/31/2014, 12/24/2015  . Pneumococcal Conjugate-13 07/19/2013  . Pneumococcal Polysaccharide-23 10/04/1997  . Td 03/01/2009    Objective:  BP 122/60 (BP Location: Left Arm, Patient Position: Sitting, Cuff Size: Normal)   Pulse 66   Temp (!) 97.3 F (36.3 C) (Oral)   Resp 16   Wt 171 lb (77.6 kg)   BMI 30.29 kg/m    Physical Exam  Constitutional: She is oriented to person, place, and time and well-developed, well-nourished, and in no distress.  HENT:  Head: Normocephalic and atraumatic.  Right Ear: External ear normal.  Left Ear: External ear normal.  Nose: Nose normal.  Mouth/Throat: Oropharynx is clear and moist.  Eyes: Conjunctivae are normal. No scleral icterus.  Neck: No thyromegaly present.  Cardiovascular: Normal rate, regular rhythm and normal heart sounds.   Pulmonary/Chest: Effort normal and breath sounds normal.  Abdominal: Soft.  Neurological: She is alert and oriented to person, place, and time. Gait normal. GCS score is 15.  Skin: Skin is warm and dry.  Psychiatric: Mood, memory, affect and judgment normal.    Lab Results  Component Value Date   WBC 6.1 08/12/2016   HGB 13.0 08/12/2016  HCT 39.1 08/12/2016   PLT 211 08/12/2016   GLUCOSE 133 (H) 08/12/2016   CHOL 148 05/27/2016   TRIG 101 05/27/2016   HDL 67 05/27/2016   LDLCALC 61 05/27/2016   TSH 3.150 05/27/2016   INR 1.08 08/12/2016   HGBA1C 7.5 05/26/2016    CMP     Component Value Date/Time   NA 129 (L) 08/12/2016 1205   NA 138 05/27/2016 0922   K 4.1 08/12/2016 1205   CL 95 (L) 08/12/2016 1205   CO2 27 08/12/2016 1205   GLUCOSE 133 (H) 08/12/2016 1205   BUN 13 08/12/2016 1205   BUN 18 05/27/2016 0922   CREATININE 0.63 08/12/2016 1205   CALCIUM 9.2 08/12/2016 1205   PROT 6.2 05/27/2016 0922   ALBUMIN 3.9 05/27/2016 0922   AST 14 05/27/2016 0922   ALT 15 05/27/2016 0922   ALKPHOS 82 05/27/2016 0922   BILITOT 0.3 05/27/2016 0922   GFRNONAA >60 08/12/2016 1205   GFRAA >60 08/12/2016 1205    Assessment and Plan :   1. Type 2 diabetes mellitus without complication, without long-term current use of insulin (HCC)  - POCT HgB A1C--7.2 Refer to podiatry for toenails. 2. Essential hypertension 3.Pacemaker  I have done the exam and reviewed the above chart and it is accurate to the best of my  knowledge. Development worker, community has been used in this note in any air is in the dictation or transcription are unintentional.  [facility] 09/28/2016 3:00 PM

## 2016-09-29 ENCOUNTER — Encounter
Admission: RE | Disposition: A | Discharge status: Home / Self Care | Payer: Self-pay | Source: Ambulatory Visit | Attending: Ophthalmology

## 2016-09-29 ENCOUNTER — Ambulatory Visit: Payer: Medicare Other | Admitting: Anesthesiology

## 2016-09-29 ENCOUNTER — Encounter: Payer: Self-pay | Admitting: *Deleted

## 2016-09-29 ENCOUNTER — Ambulatory Visit
Admission: RE | Admit: 2016-09-29 | Discharge: 2016-09-29 | Disposition: A | Discharge status: Home / Self Care | Payer: Medicare Other | Source: Ambulatory Visit | Attending: Ophthalmology | Admitting: Ophthalmology

## 2016-09-29 DIAGNOSIS — D649 Anemia, unspecified: Secondary | ICD-10-CM | POA: Insufficient documentation

## 2016-09-29 DIAGNOSIS — Z90710 Acquired absence of both cervix and uterus: Secondary | ICD-10-CM | POA: Diagnosis not present

## 2016-09-29 DIAGNOSIS — E7800 Pure hypercholesterolemia, unspecified: Secondary | ICD-10-CM | POA: Diagnosis not present

## 2016-09-29 DIAGNOSIS — M810 Age-related osteoporosis without current pathological fracture: Secondary | ICD-10-CM | POA: Diagnosis not present

## 2016-09-29 DIAGNOSIS — M7989 Other specified soft tissue disorders: Secondary | ICD-10-CM | POA: Diagnosis not present

## 2016-09-29 DIAGNOSIS — Z9049 Acquired absence of other specified parts of digestive tract: Secondary | ICD-10-CM | POA: Insufficient documentation

## 2016-09-29 DIAGNOSIS — E119 Type 2 diabetes mellitus without complications: Secondary | ICD-10-CM | POA: Diagnosis not present

## 2016-09-29 DIAGNOSIS — H9190 Unspecified hearing loss, unspecified ear: Secondary | ICD-10-CM | POA: Diagnosis not present

## 2016-09-29 DIAGNOSIS — Z885 Allergy status to narcotic agent status: Secondary | ICD-10-CM | POA: Insufficient documentation

## 2016-09-29 DIAGNOSIS — Z950 Presence of cardiac pacemaker: Secondary | ICD-10-CM | POA: Diagnosis not present

## 2016-09-29 DIAGNOSIS — K219 Gastro-esophageal reflux disease without esophagitis: Secondary | ICD-10-CM | POA: Insufficient documentation

## 2016-09-29 DIAGNOSIS — Z6829 Body mass index (BMI) 29.0-29.9, adult: Secondary | ICD-10-CM | POA: Diagnosis not present

## 2016-09-29 DIAGNOSIS — H2511 Age-related nuclear cataract, right eye: Secondary | ICD-10-CM | POA: Insufficient documentation

## 2016-09-29 DIAGNOSIS — I10 Essential (primary) hypertension: Secondary | ICD-10-CM | POA: Insufficient documentation

## 2016-09-29 DIAGNOSIS — E1136 Type 2 diabetes mellitus with diabetic cataract: Secondary | ICD-10-CM | POA: Diagnosis not present

## 2016-09-29 DIAGNOSIS — J45909 Unspecified asthma, uncomplicated: Secondary | ICD-10-CM | POA: Insufficient documentation

## 2016-09-29 HISTORY — DX: Presence of cardiac pacemaker: Z95.0

## 2016-09-29 HISTORY — PX: CATARACT EXTRACTION W/PHACO: SHX586

## 2016-09-29 HISTORY — DX: Unspecified hearing loss, unspecified ear: H91.90

## 2016-09-29 HISTORY — DX: Cardiac arrhythmia, unspecified: I49.9

## 2016-09-29 HISTORY — DX: Gastro-esophageal reflux disease without esophagitis: K21.9

## 2016-09-29 LAB — GLUCOSE, CAPILLARY: GLUCOSE-CAPILLARY: 165 mg/dL — AB (ref 65–99)

## 2016-09-29 SURGERY — PHACOEMULSIFICATION, CATARACT, WITH IOL INSERTION
Anesthesia: Monitor Anesthesia Care | Site: Eye | Laterality: Right | Wound class: Clean

## 2016-09-29 MED ORDER — ARMC OPHTHALMIC DILATING DROPS
OPHTHALMIC | Status: AC
  Administered 2016-09-29: 1 via OPHTHALMIC
  Filled 2016-09-29: qty 0.4

## 2016-09-29 MED ORDER — FENTANYL CITRATE (PF) 100 MCG/2ML IJ SOLN
INTRAMUSCULAR | Status: DC | PRN
  Administered 2016-09-29 (×2): 25 ug via INTRAVENOUS

## 2016-09-29 MED ORDER — ARMC OPHTHALMIC DILATING DROPS
1.0000 "application " | OPHTHALMIC | Status: AC
  Administered 2016-09-29 (×3): 1 via OPHTHALMIC

## 2016-09-29 MED ORDER — FENTANYL CITRATE (PF) 100 MCG/2ML IJ SOLN
INTRAMUSCULAR | Status: AC
  Filled 2016-09-29: qty 2

## 2016-09-29 MED ORDER — LIDOCAINE HCL (PF) 4 % IJ SOLN
INTRAOCULAR | Status: DC | PRN
  Administered 2016-09-29: 2 mL via OPHTHALMIC

## 2016-09-29 MED ORDER — CARBACHOL 0.01 % IO SOLN
INTRAOCULAR | Status: DC | PRN
  Administered 2016-09-29: .5 mL via INTRAOCULAR

## 2016-09-29 MED ORDER — BSS IO SOLN
INTRAOCULAR | Status: DC | PRN
  Administered 2016-09-29: 1 mL via OPHTHALMIC

## 2016-09-29 MED ORDER — MOXIFLOXACIN HCL 0.5 % OP SOLN: 1.0000 [drp] | OPHTHALMIC | Status: DC | PRN

## 2016-09-29 MED ORDER — SODIUM CHLORIDE 0.9 % IV SOLN
INTRAVENOUS | Status: DC
  Administered 2016-09-29: 09:00:00 via INTRAVENOUS

## 2016-09-29 MED ORDER — MOXIFLOXACIN HCL 0.5 % OP SOLN
OPHTHALMIC | Status: AC
  Filled 2016-09-29: qty 3

## 2016-09-29 MED ORDER — POVIDONE-IODINE 5 % OP SOLN
OPHTHALMIC | Status: DC | PRN
  Administered 2016-09-29: 1 via OPHTHALMIC

## 2016-09-29 MED ORDER — MOXIFLOXACIN HCL 0.5 % OP SOLN
OPHTHALMIC | Status: DC | PRN
  Administered 2016-09-29: .2 mL via OPHTHALMIC

## 2016-09-29 MED ORDER — NA CHONDROIT SULF-NA HYALURON 40-17 MG/ML IO SOLN
INTRAOCULAR | Status: DC | PRN
  Administered 2016-09-29: 1 mL via INTRAOCULAR

## 2016-09-29 SURGICAL SUPPLY — 16 items
GLOVE BIO SURGEON STRL SZ8 (GLOVE) ×3 IMPLANT
GLOVE BIOGEL M 6.5 STRL (GLOVE) ×3 IMPLANT
GLOVE SURG LX 8.0 MICRO (GLOVE) ×2
GLOVE SURG LX STRL 8.0 MICRO (GLOVE) ×1 IMPLANT
GOWN STRL REUS W/ TWL LRG LVL3 (GOWN DISPOSABLE) ×2 IMPLANT
GOWN STRL REUS W/TWL LRG LVL3 (GOWN DISPOSABLE) ×4
LABEL CATARACT MEDS ST (LABEL) ×3 IMPLANT
LENS IOL TECNIS ITEC 15.0 (Intraocular Lens) ×3 IMPLANT
PACK CATARACT (MISCELLANEOUS) ×3 IMPLANT
PACK CATARACT BRASINGTON LX (MISCELLANEOUS) ×3 IMPLANT
PACK EYE AFTER SURG (MISCELLANEOUS) ×3 IMPLANT
SOL BSS BAG (MISCELLANEOUS) ×3
SOLUTION BSS BAG (MISCELLANEOUS) ×1 IMPLANT
SYR 5ML LL (SYRINGE) ×3 IMPLANT
WATER STERILE IRR 250ML POUR (IV SOLUTION) ×3 IMPLANT
WIPE NON LINTING 3.25X3.25 (MISCELLANEOUS) ×3 IMPLANT

## 2016-09-29 NOTE — Anesthesia Postprocedure Evaluation (Signed)
Anesthesia Post Note  Patient: Taylor Snyder  Procedure(s) Performed: Procedure(s) (LRB): CATARACT EXTRACTION PHACO AND INTRAOCULAR LENS PLACEMENT (IOC) (Right)  Patient location during evaluation: PACU Anesthesia Type: MAC Level of consciousness: awake and alert Pain management: pain level controlled Vital Signs Assessment: post-procedure vital signs reviewed and stable Respiratory status: spontaneous breathing, nonlabored ventilation, respiratory function stable and patient connected to nasal cannula oxygen Cardiovascular status: stable and blood pressure returned to baseline Anesthetic complications: no     Last Vitals:  Vitals:   09/29/16 1029  BP: (!) 159/43  Pulse: 77  Resp: 16  SpO2: 100%    Last Pain:  Vitals:   09/29/16 1029  TempSrc: Temporal                 [name]

## 2016-09-29 NOTE — Anesthesia Procedure Notes (Signed)
Procedure Name: MAC Date/Time: 09/29/2016 10:07 AM Performed by: [name] Pre-anesthesia Checklist: Patient identified, Emergency Drugs available, Suction available, Patient being monitored and Timeout performed Patient Re-evaluated:Patient Re-evaluated prior to induction Oxygen Delivery Method: Nasal cannula Placement Confirmation: positive ETCO2

## 2016-09-29 NOTE — Pre-Procedure Instructions (Addendum)
Lab called and patient pos mrsa / pos staph.Called to [place] [facility] staff. Spoke with Hollice Espy  ABOVE ENTERED ON WRONG CHART

## 2016-09-29 NOTE — Anesthesia Preprocedure Evaluation (Addendum)
Anesthesia Evaluation  Patient identified by MRN, date of birth, ID band Patient awake    Reviewed: Allergy & Precautions, NPO status , Patient's Chart, lab work & pertinent test results  History of Anesthesia Complications Negative for: history of anesthetic complications  Airway Mallampati: II       Dental   Pulmonary neg pulmonary ROS, asthma ,           Cardiovascular hypertension, Pt. on medications + dysrhythmias (occassional irregular beats) + pacemaker      Neuro/Psych negative neurological ROS     GI/Hepatic Neg liver ROS, GERD  Medicated and Controlled,  Endo/Other  diabetes, Type 2, Oral Hypoglycemic Agents  Renal/GU negative Renal ROS     Musculoskeletal   Abdominal   Peds  Hematology   Anesthesia Other Findings   Reproductive/Obstetrics                            Anesthesia Physical Anesthesia Plan  ASA: III  Anesthesia Plan: MAC   Post-op Pain Management:    Induction:   PONV Risk Score and Plan:   Airway Management Planned:   Additional Equipment:   Intra-op Plan:   Post-operative Plan:   Informed Consent: I have reviewed the patients History and Physical, chart, labs and discussed the procedure including the risks, benefits and alternatives for the proposed anesthesia with the patient or authorized representative who has indicated his/her understanding and acceptance.     Plan Discussed with:   Anesthesia Plan Comments:         Anesthesia Quick Evaluation

## 2016-09-29 NOTE — Anesthesia Post-op Follow-up Note (Signed)
Anesthesia QCDR form completed.        

## 2016-09-29 NOTE — Op Note (Signed)
PREOPERATIVE DIAGNOSIS:  Nuclear sclerotic cataract of the right eye.   POSTOPERATIVE DIAGNOSIS:  nuclear sclerotic cataract right eye   OPERATIVE PROCEDURE: Procedure(s): CATARACT EXTRACTION PHACO AND INTRAOCULAR LENS PLACEMENT (IOC)   SURGEON:  Birder Robson, MD.   ANESTHESIA:  Anesthesiologist: Gunnar Fusi, MD CRNA: [name], CRNA  1.      Managed anesthesia care. 2.      0.61ml of Shugarcaine was instilled in the eye following the paracentesis.   COMPLICATIONS:  None.   TECHNIQUE:   Stop and chop   DESCRIPTION OF PROCEDURE:  The patient was examined and consented in the preoperative holding area where the aforementioned topical anesthesia was applied to the right eye and then brought back to the Operating Room where the right eye was prepped and draped in the usual sterile ophthalmic fashion and a lid speculum was placed. A paracentesis was created with the side port blade and the anterior chamber was filled with viscoelastic. A near clear corneal incision was performed with the steel keratome. A continuous curvilinear capsulorrhexis was performed with a cystotome followed by the capsulorrhexis forceps. Hydrodissection and hydrodelineation were carried out with BSS on a blunt cannula. The lens was removed in a stop and chop  technique and the remaining cortical material was removed with the irrigation-aspiration handpiece. The capsular bag was inflated with viscoelastic and the Technis ZCB00  lens was placed in the capsular bag without complication. The remaining viscoelastic was removed from the eye with the irrigation-aspiration handpiece. The wounds were hydrated. The anterior chamber was flushed with Miostat and the eye was inflated to physiologic pressure. 0.57ml of Vigamox was placed in the anterior chamber. The wounds were found to be water tight. The eye was dressed with Vigamox. The patient was given protective glasses to wear throughout the day and a shield with which  to sleep tonight. The patient was also given drops with which to begin a drop regimen today and will follow-up with me in one day. * No implants in log * Procedure(s) with comments: CATARACT EXTRACTION PHACO AND INTRAOCULAR LENS PLACEMENT (IOC) (Right) - [place] 00:46 AP% 18.9 CDE 8.69 Fluid pack lot # [id number]  Electronically signed: Page 09/29/2016 10:27 AM

## 2016-09-29 NOTE — Discharge Instructions (Signed)
Eye Surgery Discharge Instructions  Expect mild scratchy sensation or mild soreness. DO NOT RUB YOUR EYE!  The day of surgery:  Minimal physical activity, but bed rest is not required  No reading, computer work, or close hand work  No bending, lifting, or straining.  May watch TV  For 24 hours:  No driving, legal decisions, or alcoholic beverages  Safety precautions  Eat anything you prefer: It is better to start with liquids, then soup then solid foods.  _____ Eye patch should be worn until postoperative exam tomorrow.  ____ Solar shield eyeglasses should be worn for comfort in the sunlight/patch while sleeping  Resume all regular medications including aspirin or Coumadin if these were discontinued prior to surgery. You may shower, bathe, shave, or wash your hair. Tylenol may be taken for mild discomfort.  Call your doctor if you experience significant pain, nausea, or vomiting, fever > 101 or other signs of infection. [phone] or [phone] Specific instructions:  Follow-up Information    Birder Robson, MD Follow up.   Specialty:  Ophthalmology Why:  August 15 at 10:50am Contact information: [address] [place] [address] [phone]

## 2016-09-29 NOTE — Transfer of Care (Signed)
Immediate Anesthesia Transfer of Care Note  Patient: Taylor Snyder  Procedure(s) Performed: Procedure(s) with comments: CATARACT EXTRACTION PHACO AND INTRAOCULAR LENS PLACEMENT (IOC) (Right) - [place] 00:46 AP% 18.9 CDE 8.69 Fluid pack lot # [id number]  Patient Location: PACU  Anesthesia Type:MAC  Level of Consciousness: awake, alert  and oriented  Airway & Oxygen Therapy: Patient Spontanous Breathing  Post-op Assessment: Report given to RN and Post -op Vital signs reviewed and stable  Post vital signs: Reviewed and stable  Last Vitals:  Vitals:   09/29/16 1029  BP: (!) 159/43  Pulse: 77  Resp: 16  SpO2: 100%    Last Pain:  Vitals:   09/29/16 1029  TempSrc: Temporal         Complications: No apparent anesthesia complications

## 2016-09-29 NOTE — H&P (Signed)
All labs reviewed. Abnormal studies sent to patients PCP when indicated.  Previous H&P reviewed, patient examined, there are NO CHANGES.  Taylor Snyder LOUIS8/14/20189:57 AM

## 2016-10-15 ENCOUNTER — Other Ambulatory Visit: Payer: Self-pay | Admitting: Family Medicine

## 2016-10-15 DIAGNOSIS — H2512 Age-related nuclear cataract, left eye: Secondary | ICD-10-CM | POA: Diagnosis not present

## 2016-10-22 ENCOUNTER — Encounter: Payer: Self-pay | Admitting: *Deleted

## 2016-10-27 ENCOUNTER — Ambulatory Visit
Admission: RE | Admit: 2016-10-27 | Discharge: 2016-10-27 | Disposition: A | Discharge status: Home / Self Care | Payer: Medicare Other | Source: Ambulatory Visit | Attending: Ophthalmology | Admitting: Ophthalmology

## 2016-10-27 ENCOUNTER — Encounter
Admission: RE | Disposition: A | Discharge status: Home / Self Care | Payer: Self-pay | Source: Ambulatory Visit | Attending: Ophthalmology

## 2016-10-27 ENCOUNTER — Ambulatory Visit: Payer: Medicare Other | Admitting: Certified Registered Nurse Anesthetist

## 2016-10-27 ENCOUNTER — Encounter: Payer: Self-pay | Admitting: Anesthesiology

## 2016-10-27 DIAGNOSIS — Z7902 Long term (current) use of antithrombotics/antiplatelets: Secondary | ICD-10-CM | POA: Diagnosis not present

## 2016-10-27 DIAGNOSIS — E119 Type 2 diabetes mellitus without complications: Secondary | ICD-10-CM | POA: Insufficient documentation

## 2016-10-27 DIAGNOSIS — Z79899 Other long term (current) drug therapy: Secondary | ICD-10-CM | POA: Diagnosis not present

## 2016-10-27 DIAGNOSIS — I10 Essential (primary) hypertension: Secondary | ICD-10-CM | POA: Insufficient documentation

## 2016-10-27 DIAGNOSIS — Z950 Presence of cardiac pacemaker: Secondary | ICD-10-CM | POA: Diagnosis not present

## 2016-10-27 DIAGNOSIS — Z7984 Long term (current) use of oral hypoglycemic drugs: Secondary | ICD-10-CM | POA: Diagnosis not present

## 2016-10-27 DIAGNOSIS — J45909 Unspecified asthma, uncomplicated: Secondary | ICD-10-CM | POA: Diagnosis not present

## 2016-10-27 DIAGNOSIS — D649 Anemia, unspecified: Secondary | ICD-10-CM | POA: Insufficient documentation

## 2016-10-27 DIAGNOSIS — E7800 Pure hypercholesterolemia, unspecified: Secondary | ICD-10-CM | POA: Insufficient documentation

## 2016-10-27 DIAGNOSIS — K219 Gastro-esophageal reflux disease without esophagitis: Secondary | ICD-10-CM | POA: Diagnosis not present

## 2016-10-27 DIAGNOSIS — H2512 Age-related nuclear cataract, left eye: Secondary | ICD-10-CM | POA: Insufficient documentation

## 2016-10-27 HISTORY — PX: CATARACT EXTRACTION W/PHACO: SHX586

## 2016-10-27 LAB — GLUCOSE, CAPILLARY: GLUCOSE-CAPILLARY: 168 mg/dL — AB (ref 65–99)

## 2016-10-27 SURGERY — PHACOEMULSIFICATION, CATARACT, WITH IOL INSERTION
Anesthesia: Monitor Anesthesia Care | Site: Eye | Laterality: Left | Wound class: Clean

## 2016-10-27 MED ORDER — NA CHONDROIT SULF-NA HYALURON 40-17 MG/ML IO SOLN
INTRAOCULAR | Status: DC | PRN
  Administered 2016-10-27: 1 mL via INTRAOCULAR

## 2016-10-27 MED ORDER — MOXIFLOXACIN HCL 0.5 % OP SOLN
OPHTHALMIC | Status: AC
  Filled 2016-10-27: qty 3

## 2016-10-27 MED ORDER — POVIDONE-IODINE 5 % OP SOLN
OPHTHALMIC | Status: DC | PRN
  Administered 2016-10-27: 1 via OPHTHALMIC

## 2016-10-27 MED ORDER — NA CHONDROIT SULF-NA HYALURON 40-17 MG/ML IO SOLN
INTRAOCULAR | Status: AC
  Filled 2016-10-27: qty 1

## 2016-10-27 MED ORDER — FENTANYL CITRATE (PF) 100 MCG/2ML IJ SOLN
INTRAMUSCULAR | Status: DC | PRN
  Administered 2016-10-27: 25 ug via INTRAVENOUS
  Administered 2016-10-27: 50 ug via INTRAVENOUS

## 2016-10-27 MED ORDER — LIDOCAINE HCL (PF) 4 % IJ SOLN
INTRAOCULAR | Status: DC | PRN
  Administered 2016-10-27: 2 mL via OPHTHALMIC

## 2016-10-27 MED ORDER — LIDOCAINE HCL (PF) 4 % IJ SOLN
INTRAMUSCULAR | Status: AC
  Filled 2016-10-27: qty 5

## 2016-10-27 MED ORDER — FENTANYL CITRATE (PF) 100 MCG/2ML IJ SOLN
INTRAMUSCULAR | Status: AC
  Filled 2016-10-27: qty 2

## 2016-10-27 MED ORDER — EPINEPHRINE PF 1 MG/ML IJ SOLN
INTRAMUSCULAR | Status: DC | PRN
  Administered 2016-10-27: 1 mL via OPHTHALMIC

## 2016-10-27 MED ORDER — SODIUM CHLORIDE 0.9 % IV SOLN
INTRAVENOUS | Status: DC
  Administered 2016-10-27: 09:00:00 via INTRAVENOUS

## 2016-10-27 MED ORDER — MOXIFLOXACIN HCL 0.5 % OP SOLN: 1.0000 [drp] | OPHTHALMIC | Status: DC | PRN

## 2016-10-27 MED ORDER — POVIDONE-IODINE 5 % OP SOLN
OPHTHALMIC | Status: AC
  Filled 2016-10-27: qty 30

## 2016-10-27 MED ORDER — ARMC OPHTHALMIC DILATING DROPS
OPHTHALMIC | Status: AC
  Administered 2016-10-27: 1 via OPHTHALMIC
  Filled 2016-10-27: qty 0.4

## 2016-10-27 MED ORDER — MOXIFLOXACIN HCL 0.5 % OP SOLN
OPHTHALMIC | Status: DC | PRN
  Administered 2016-10-27: .2 mL via OPHTHALMIC

## 2016-10-27 MED ORDER — CARBACHOL 0.01 % IO SOLN
INTRAOCULAR | Status: DC | PRN
  Administered 2016-10-27: .5 mL via INTRAOCULAR

## 2016-10-27 MED ORDER — ARMC OPHTHALMIC DILATING DROPS
1.0000 "application " | OPHTHALMIC | Status: AC
  Administered 2016-10-27 (×3): 1 via OPHTHALMIC

## 2016-10-27 MED ORDER — EPINEPHRINE PF 1 MG/ML IJ SOLN
INTRAMUSCULAR | Status: AC
  Filled 2016-10-27: qty 1

## 2016-10-27 SURGICAL SUPPLY — 16 items
GLOVE BIO SURGEON STRL SZ8 (GLOVE) ×2 IMPLANT
GLOVE BIOGEL M 6.5 STRL (GLOVE) ×2 IMPLANT
GLOVE SURG LX 8.0 MICRO (GLOVE) ×1
GLOVE SURG LX STRL 8.0 MICRO (GLOVE) ×1 IMPLANT
GOWN STRL REUS W/ TWL LRG LVL3 (GOWN DISPOSABLE) ×2 IMPLANT
GOWN STRL REUS W/TWL LRG LVL3 (GOWN DISPOSABLE) ×2
LABEL CATARACT MEDS ST (LABEL) ×2 IMPLANT
LENS IOL TECNIS ITEC 15.5 (Intraocular Lens) ×2 IMPLANT
PACK CATARACT (MISCELLANEOUS) ×2 IMPLANT
PACK CATARACT BRASINGTON LX (MISCELLANEOUS) ×2 IMPLANT
PACK EYE AFTER SURG (MISCELLANEOUS) ×2 IMPLANT
SOL BSS BAG (MISCELLANEOUS) ×2
SOLUTION BSS BAG (MISCELLANEOUS) ×1 IMPLANT
SYR 5ML LL (SYRINGE) ×2 IMPLANT
WATER STERILE IRR 250ML POUR (IV SOLUTION) ×2 IMPLANT
WIPE NON LINTING 3.25X3.25 (MISCELLANEOUS) ×2 IMPLANT

## 2016-10-27 NOTE — Anesthesia Preprocedure Evaluation (Signed)
Anesthesia Evaluation  Patient identified by MRN, date of birth, ID band Patient awake    Reviewed: Allergy & Precautions, NPO status , Patient's Chart, lab work & pertinent test results  History of Anesthesia Complications Negative for: history of anesthetic complications  Airway Mallampati: II       Dental   Pulmonary neg pulmonary ROS, asthma ,           Cardiovascular hypertension, Pt. on medications + dysrhythmias (occassional irregular beats) + pacemaker      Neuro/Psych negative neurological ROS     GI/Hepatic Neg liver ROS, GERD  Medicated and Controlled,  Endo/Other  diabetes, Type 2, Oral Hypoglycemic Agents  Renal/GU negative Renal ROS     Musculoskeletal   Abdominal   Peds  Hematology   Anesthesia Other Findings   Reproductive/Obstetrics                             Anesthesia Physical  Anesthesia Plan  ASA: III  Anesthesia Plan: MAC   Post-op Pain Management:    Induction:   PONV Risk Score and Plan:   Airway Management Planned:   Additional Equipment:   Intra-op Plan:   Post-operative Plan:   Informed Consent: I have reviewed the patients History and Physical, chart, labs and discussed the procedure including the risks, benefits and alternatives for the proposed anesthesia with the patient or authorized representative who has indicated his/her understanding and acceptance.     Plan Discussed with:   Anesthesia Plan Comments:         Anesthesia Quick Evaluation

## 2016-10-27 NOTE — Anesthesia Post-op Follow-up Note (Signed)
Anesthesia QCDR form completed.        

## 2016-10-27 NOTE — H&P (Signed)
All labs reviewed. Abnormal studies sent to patients PCP when indicated.  Previous H&P reviewed, patient examined, there are NO CHANGES.  Taylor Snyder LOUIS9/11/201810:01 AM

## 2016-10-27 NOTE — Transfer of Care (Signed)
Immediate Anesthesia Transfer of Care Note  Patient: Taylor Snyder  Procedure(s) Performed: Procedure(s) with comments: CATARACT EXTRACTION PHACO AND INTRAOCULAR LENS PLACEMENT (IOC) (Left) - [place] 00:49 AP% 22.4 CDE 11.03 Fluid pack lot # [id number]  Patient Location: PACU  Anesthesia Type:MAC  Level of Consciousness: awake, alert  and oriented  Airway & Oxygen Therapy: Patient Spontanous Breathing  Post-op Assessment: Report given to RN and Post -op Vital signs reviewed and stable  Post vital signs: Reviewed and stable  Last Vitals:  Vitals:   10/27/16 0909 10/27/16 1032  BP: (!) 163/60 (!) 152/55  Pulse:  62  Resp: 16 16  Temp: 36.6 C   SpO2: 96% 99%    Last Pain:  Vitals:   10/27/16 1032  TempSrc: Oral         Complications: No apparent anesthesia complications

## 2016-10-27 NOTE — Anesthesia Procedure Notes (Signed)
Procedure Name: MAC Date/Time: 10/27/2016 10:05 AM Performed by: [name] Pre-anesthesia Checklist: Patient identified, Emergency Drugs available, Suction available, Patient being monitored and Timeout performed Oxygen Delivery Method: Nasal cannula Placement Confirmation: positive ETCO2

## 2016-10-27 NOTE — Discharge Instructions (Signed)
Eye Surgery Discharge Instructions  Expect mild scratchy sensation or mild soreness. DO NOT RUB YOUR EYE!  The day of surgery:  Minimal physical activity, but bed rest is not required  No reading, computer work, or close hand work  No bending, lifting, or straining.  May watch TV  For 24 hours:  No driving, legal decisions, or alcoholic beverages  Safety precautions  Eat anything you prefer: It is better to start with liquids, then soup then solid foods.  _____ Eye patch should be worn until postoperative exam tomorrow.  ____ Solar shield eyeglasses should be worn for comfort in the sunlight/patch while sleeping  Resume all regular medications including aspirin or Coumadin if these were discontinued prior to surgery. You may shower, bathe, shave, or wash your hair. Tylenol may be taken for mild discomfort.  Call your doctor if you experience significant pain, nausea, or vomiting, fever > 101 or other signs of infection. [phone] or [phone] Specific instructions:  Follow-up Information    Birder Robson, MD Follow up on 10/28/2016.   Specialty:  Ophthalmology Why:  9:25 Contact information: [address] [place] [address] [phone]

## 2016-10-27 NOTE — Anesthesia Postprocedure Evaluation (Signed)
Anesthesia Post Note  Patient: DEVONNE KITCHEN  Procedure(s) Performed: Procedure(s) (LRB): CATARACT EXTRACTION PHACO AND INTRAOCULAR LENS PLACEMENT (IOC) (Left)  Patient location during evaluation: PACU Anesthesia Type: MAC Level of consciousness: awake and alert Pain management: pain level controlled Vital Signs Assessment: post-procedure vital signs reviewed and stable Respiratory status: spontaneous breathing, nonlabored ventilation, respiratory function stable and patient connected to nasal cannula oxygen Cardiovascular status: stable and blood pressure returned to baseline Anesthetic complications: no     Last Vitals:  Vitals:   10/27/16 0909 10/27/16 1032  BP: (!) 163/60 (!) 152/55  Pulse:  62  Resp: 16 16  Temp: 36.6 C   SpO2: 96% 99%    Last Pain:  Vitals:   10/27/16 1032  TempSrc: Oral                 [name]

## 2016-10-27 NOTE — Op Note (Signed)
PREOPERATIVE DIAGNOSIS:  Nuclear sclerotic cataract of the left eye.   POSTOPERATIVE DIAGNOSIS:  Nuclear sclerotic cataract of the left eye.   OPERATIVE PROCEDURE: Procedure(s): CATARACT EXTRACTION PHACO AND INTRAOCULAR LENS PLACEMENT (IOC)   SURGEON:  Birder Robson, MD.   ANESTHESIA:  Anesthesiologist: Martha Clan, MD CRNA: [name], CRNA  1.      Managed anesthesia care. 2.     0.73ml of Shugarcaine was instilled following the paracentesis   COMPLICATIONS:  None.   TECHNIQUE:   Stop and chop   DESCRIPTION OF PROCEDURE:  The patient was examined and consented in the preoperative holding area where the aforementioned topical anesthesia was applied to the left eye and then brought back to the Operating Room where the left eye was prepped and draped in the usual sterile ophthalmic fashion and a lid speculum was placed. A paracentesis was created with the side port blade and the anterior chamber was filled with viscoelastic. A near clear corneal incision was performed with the steel keratome. A continuous curvilinear capsulorrhexis was performed with a cystotome followed by the capsulorrhexis forceps. Hydrodissection and hydrodelineation were carried out with BSS on a blunt cannula. The lens was removed in a stop and chop  technique and the remaining cortical material was removed with the irrigation-aspiration handpiece. The capsular bag was inflated with viscoelastic and the Technis ZCB00 lens was placed in the capsular bag without complication. The remaining viscoelastic was removed from the eye with the irrigation-aspiration handpiece. The wounds were hydrated. The anterior chamber was flushed with Miostat and the eye was inflated to physiologic pressure. 0.42ml Vigamox was placed in the anterior chamber. The wounds were found to be water tight. The eye was dressed with Vigamox. The patient was given protective glasses to wear throughout the day and a shield with which to sleep  tonight. The patient was also given drops with which to begin a drop regimen today and will follow-up with me in one day.  Implant Name Type Inv. Item Serial No. Manufacturer Lot No. LRB No. Used  LENS IOL DIOP 15.5 - [id number] Intraocular Lens LENS IOL DIOP 15.5 [id number]   Left 1    Procedure(s) with comments: CATARACT EXTRACTION PHACO AND INTRAOCULAR LENS PLACEMENT (IOC) (Left) - [place] 00:49 AP% 22.4 CDE 11.03 Fluid pack lot # [id number]  Electronically signed: Mikhala Kenan LOUIS 10/27/2016 10:30 AM

## 2016-11-09 ENCOUNTER — Other Ambulatory Visit: Payer: Self-pay | Admitting: Family Medicine

## 2016-11-09 DIAGNOSIS — I10 Essential (primary) hypertension: Secondary | ICD-10-CM

## 2016-12-01 ENCOUNTER — Other Ambulatory Visit: Payer: Self-pay | Admitting: Family Medicine

## 2016-12-10 ENCOUNTER — Ambulatory Visit (INDEPENDENT_AMBULATORY_CARE_PROVIDER_SITE_OTHER): Payer: Medicare Other

## 2016-12-10 DIAGNOSIS — Z23 Encounter for immunization: Secondary | ICD-10-CM | POA: Diagnosis not present

## 2017-01-01 ENCOUNTER — Other Ambulatory Visit: Payer: Self-pay | Admitting: Family Medicine

## 2017-01-15 ENCOUNTER — Other Ambulatory Visit: Payer: Self-pay | Admitting: Family Medicine

## 2017-01-20 DIAGNOSIS — I495 Sick sinus syndrome: Secondary | ICD-10-CM | POA: Diagnosis not present

## 2017-01-20 DIAGNOSIS — I341 Nonrheumatic mitral (valve) prolapse: Secondary | ICD-10-CM | POA: Diagnosis not present

## 2017-01-20 DIAGNOSIS — I10 Essential (primary) hypertension: Secondary | ICD-10-CM | POA: Diagnosis not present

## 2017-01-20 DIAGNOSIS — I480 Paroxysmal atrial fibrillation: Secondary | ICD-10-CM | POA: Diagnosis not present

## 2017-01-28 ENCOUNTER — Ambulatory Visit: Payer: Medicare Other | Admitting: Family Medicine

## 2017-02-17 ENCOUNTER — Other Ambulatory Visit: Payer: Self-pay | Admitting: Family Medicine

## 2017-03-03 ENCOUNTER — Other Ambulatory Visit: Payer: Self-pay | Admitting: Family Medicine

## 2017-03-04 ENCOUNTER — Ambulatory Visit (INDEPENDENT_AMBULATORY_CARE_PROVIDER_SITE_OTHER): Payer: Medicare Other

## 2017-03-04 VITALS — BP 138/62 | HR 84 | Temp 99.0°F | Ht 62.0 in | Wt 174.2 lb

## 2017-03-04 DIAGNOSIS — Z0000 Encounter for general adult medical examination without abnormal findings: Secondary | ICD-10-CM | POA: Diagnosis not present

## 2017-03-04 NOTE — Progress Notes (Signed)
Subjective:   Taylor Snyder is a 84 y.o. female who presents for Medicare Annual (Subsequent) preventive examination.  Review of Systems:  N/A  Cardiac Risk Factors include: advanced age (>51men, >64 women);diabetes mellitus;dyslipidemia;hypertension;obesity (BMI >30kg/m2)     Objective:     Vitals: BP 138/62 (BP Location: Left Arm)   Pulse 84   Temp 99 F (37.2 C) (Oral)   Ht 5\' 2"  (1.575 m)   Wt 174 lb 3.2 oz (79 kg)   BMI 31.86 kg/m   Body mass index is 31.86 kg/m.  Advanced Directives 03/04/2017 08/21/2016 08/12/2016 02/26/2016 12/24/2015 12/31/2014 12/17/2014  Does Patient Have a Medical Advance Directive? No No No No No No No  Does patient want to make changes to medical advance directive? - - - Yes (MAU/Ambulatory/Procedural Areas - Information given) - - -  Would patient like information on creating a medical advance directive? No - Patient declined No - Patient declined Yes (MAU/Ambulatory/Procedural Areas - Information given) - - - -    Tobacco Social History   Tobacco Use  Smoking Status Never Smoker  Smokeless Tobacco Never Used     Counseling given: Not Answered   Clinical Intake:  Pre-visit preparation completed: Yes  Pain : No/denies pain Pain Score: 0-No pain     Nutritional Status: BMI > 30  Obese Nutritional Risks: None Diabetes: Yes CBG done?: No Did pt. bring in CBG monitor from home?: No  How often do you need to have someone help you when you read instructions, pamphlets, or other written materials from your doctor or pharmacy?: 1 - Never  Interpreter Needed?: No  Information entered by :: [facility], LPN  Past Medical History:  Diagnosis Date  . A-fib ([place])   . Anemia   . Asthma   . Colon polyps 2005  . Diabetes mellitus without complication ([place]) 2595  . Dysrhythmia   . GERD (gastroesophageal reflux disease)   . HOH (hard of hearing)    AIDS  . Hyperlipidemia   . Hypertension   . Obesity   . Presence of permanent cardiac  pacemaker   . Rheumatic mitral valve failure   . Unspecified atrial fibrillation [facility])    Past Surgical History:  Procedure Laterality Date  . ABDOMINAL HYSTERECTOMY    . APPENDECTOMY    . CATARACT EXTRACTION W/PHACO Right 09/29/2016   Procedure: CATARACT EXTRACTION PHACO AND INTRAOCULAR LENS PLACEMENT (IOC);  Surgeon: Birder Robson, MD;  Location: [facility] ORS;  Service: Ophthalmology;  Laterality: Right;  [place] 00:46 AP% 18.9 CDE 8.69 Fluid pack lot # [id number]  . CATARACT EXTRACTION W/PHACO Left 10/27/2016   Procedure: CATARACT EXTRACTION PHACO AND INTRAOCULAR LENS PLACEMENT ([place]);  Surgeon: Birder Robson, MD;  Location: [facility] ORS;  Service: Ophthalmology;  Laterality: Left;  [place] 00:49 AP% 22.4 CDE 11.03 Fluid pack lot # [id number]  . CHOLECYSTECTOMY  2008  . COLONOSCOPY  2005, 2014   Dr Bary Castilla  . FLEXIBLE SIGMOIDOSCOPY  2006  . INSERT / REPLACE / REMOVE PACEMAKER    . PACEMAKER INSERTION Left 08/21/2016   Procedure: PACEMAKER CHANGE OUT;  Surgeon: Marzetta Board, MD;  Location: [facility] ORS;  Service: Cardiovascular;  Laterality: Left;  . PACEMAKER PLACEMENT  2008  . salpingo oophorectmy     . TONSILLECTOMY     Family History  Problem Relation Age of Onset  . Hypertension Mother   . Stroke Father   . Heart disease Brother   . Alcohol abuse Brother    Social History  Socioeconomic History  . Marital status: Married    Spouse name: None  . Number of children: 1  . Years of education: None  . Highest education level: 12th grade  Social Needs  . Financial resource strain: Not hard at all  . Food insecurity - worry: Never true  . Food insecurity - inability: Never true  . Transportation needs - medical: No  . Transportation needs - non-medical: No  Occupational History  . Occupation: RETIRED  Tobacco Use  . Smoking status: Never Smoker  . Smokeless tobacco: Never Used  Substance and Sexual Activity  . Alcohol use: No  . Drug use: No  . Sexual activity: No  Other  Topics Concern  . None  Social History Narrative  . None    Outpatient Encounter Medications as of 03/04/2017  Medication Sig  . acetaminophen (TYLENOL 8 HOUR) 650 MG CR tablet Take 650 mg by mouth daily as needed for pain.  Marland Kitchen ADVAIR DISKUS 100-50 MCG/DOSE AEPB INHALE 1 PUFF INTO THE LUNGS TWICE DAILY  . Calcium Carbonate-Vitamin D 600-400 MG-UNIT per tablet Take 1 tablet by mouth daily.   . cetirizine (ZYRTEC) 10 MG tablet Take 10 mg by mouth at bedtime as needed for allergies.   . Cholecalciferol (VITAMIN D3) 2000 units TABS Take by mouth.  . enalapril (VASOTEC) 5 MG tablet TAKE ONE TABLET BY MOUTH EVERY DAY.  . ferrous sulfate 324 (65 Fe) MG TBEC TAKE 1 TABLET BY MOUTH TWICE DAILY  . fluticasone (FLONASE) 50 MCG/ACT nasal spray USE 2 SPRAYS IN EACH NOSTRIL ONCE DAILY (Patient taking differently: USE 1 SPRAYS IN EACH NOSTRIL ONCE DAILY AS NEEDED FOR ALLERGIES)  . glucose blood (ACCU-CHEK COMPACT PLUS) test strip Check sugar once daily DX E11.9  . metFORMIN (GLUCOPHAGE) 500 MG tablet TAKE 2 TABLETS BY MOUTH EVERY DAY (Patient taking differently: TAKE 1 TABLETS BY MOUTH TWICE DAILY)  . Multiple Vitamins-Minerals (CENTRUM SILVER) tablet Take 1 tablet by mouth daily.   Marland Kitchen omeprazole (PRILOSEC) 20 MG capsule TAKE 1 CAPSULE(20 MG) BY MOUTH DAILY  . pravastatin (PRAVACHOL) 40 MG tablet TAKE ONE TABLET BY MOUTH NIGHTLY AT BEDTIME  . raloxifene (EVISTA) 60 MG tablet TAKE ONE TABLET BY MOUTH EVERY DAY  . XARELTO 20 MG TABS tablet TAKE 1 TABLET BY MOUTH EVERY DAY   No facility-administered encounter medications on file as of 03/04/2017.     Activities of Daily Living In your present state of health, do you have any difficulty performing the following activities: 03/04/2017 08/12/2016  Hearing? Tempie Donning  Comment wears bilateral hearing aids wears bilateral hearing aides  Vision? N N  Difficulty concentrating or making decisions? N N  Walking or climbing stairs? Y Y  Comment due to bursitis in both  knees legs feel unsteady at times, uses a cane for balance  Dressing or bathing? N N  Doing errands, shopping? N N  Preparing Food and eating ? N -  Using the Toilet? N -  In the past six months, have you accidently leaked urine? Y -  Comment occasionally, wears protection as needed -  Do you have problems with loss of bowel control? N -  Managing your Medications? N -  Managing your Finances? N -  Housekeeping or managing your Housekeeping? N -  Some recent data might be hidden    Patient Care Team: Jerrol Banana., MD as PCP - General (Family Medicine) Corey Skains, MD as Consulting Physician (Cardiology) Birder Robson, MD as Referring Physician (  Ophthalmology)    Assessment:   This is a routine wellness examination for Taylor Snyder.  Exercise Activities and Dietary recommendations Current Exercise Habits: The patient does not participate in regular exercise at present, Exercise limited by: orthopedic condition(s);Other - see comments(stays busy)  Goals    . DIET - INCREASE WATER INTAKE     Recommend increasing water intake to 6-8 glasses a day.        Fall Risk Fall Risk  03/04/2017 02/26/2016 10/31/2014  Falls in the past year? Yes Yes Yes  Comment - tripped -  Number falls in past yr: 1 1 1   Injury with Fall? No No No  Follow up Falls prevention discussed Falls prevention discussed Falls prevention discussed;Education provided   Is the patient's home free of loose throw rugs in walkways, pet beds, electrical cords, etc?   yes      Grab bars in the bathroom? no      Handrails on the stairs?   yes      Adequate lighting?   yes  Timed Get Up and Go performed: N/A  Depression Screen PHQ 2/9 Scores 03/04/2017 02/26/2016 10/31/2014  PHQ - 2 Score 0 0 0     Cognitive Function     6CIT Screen 02/26/2016  What Year? 0 points  What month? 0 points  What time? 0 points  Count back from 20 0 points  Months in reverse 0 points  Repeat phrase 0 points  Total  Score 0    Immunization History  Administered Date(s) Administered  . Influenza, High Dose Seasonal PF 10/31/2014, 12/24/2015, 12/10/2016  . Pneumococcal Conjugate-13 07/19/2013  . Pneumococcal Polysaccharide-23 10/04/1997  . Td 03/01/2009    Qualifies for Shingles Vaccine? Due for Shingles vaccine. Declined my offer to administer today. Education has been provided regarding the importance of this vaccine. Pt has been advised to call her insurance company to determine her out of pocket expense. Advised she may also receive this vaccine at her local pharmacy or Health Dept. Verbalized acceptance and understanding.  Screening Tests Health Maintenance  Topic Date Due  . FOOT EXAM  06/22/1938  . HEMOGLOBIN A1C  03/31/2017  . OPHTHALMOLOGY EXAM  06/24/2017  . TETANUS/TDAP  03/02/2019  . INFLUENZA VACCINE  Completed  . DEXA SCAN  Completed  . PNA vac Low Risk Adult  Completed    Cancer Screenings: Lung: Low Dose CT Chest recommended if Age 79-80 years, 30 pack-year currently smoking OR have quit w/in 15years. Patient does qualify. Breast:  Up to date on Mammogram? Yes   Up to date of Bone Density/Dexa? Yes Colorectal: Up to date  Additional Screenings:  Hepatitis B/HIV/Syphillis: Pt declines today.  Hepatitis C Screening: Pt declines today.      Plan:  I have personally reviewed and addressed the Medicare Annual Wellness questionnaire and have noted the following in the patient's chart:  A. Medical and social history B. Use of alcohol, tobacco or illicit drugs  C. Current medications and supplements D. Functional ability and status E.  Nutritional status F.  Physical activity G. Advance directives H. List of other physicians I.  Hospitalizations, surgeries, and ER visits in previous 12 months J.  Cole such as hearing and vision if needed, cognitive and depression L. Referrals and appointments - none  In addition, I have reviewed and discussed with patient  certain preventive protocols, quality metrics, and best practice recommendations. A written personalized care plan for preventive services as well as general preventive health  recommendations were provided to patient.  See attached scanned questionnaire for additional information.   Signed,  Fabio Neighbors, LPN Nurse Health Advisor   Nurse Recommendations: Pt needs a diabetic foot exam.

## 2017-03-04 NOTE — Patient Instructions (Signed)
Taylor Snyder , Thank you for taking time to come for your Medicare Wellness Visit. I appreciate your ongoing commitment to your health goals. Please review the following plan we discussed and let me know if I can assist you in the future.   Screening recommendations/referrals: Colonoscopy: Up to date Mammogram: Up to date Bone Density: Up to date Recommended yearly ophthalmology/optometry visit for glaucoma screening and checkup Recommended yearly dental visit for hygiene and checkup  Vaccinations: Influenza vaccine: Up to date Pneumococcal vaccine: Up to date Tdap vaccine: Up to date Shingles vaccine: Pt declines today.     Advanced directives: Advance directive discussed with you today. Even though you declined this today please call our office should you change your mind and we can give you the proper paperwork for you to fill out.  Conditions/risks identified: Fall risk prevention; Obesity- recommend increasing water intake to 6-8 glasses a day.   Next appointment: 03/16/17   Preventive Care 65 Years and Older, Female Preventive care refers to lifestyle choices and visits with your health care provider that can promote health and wellness. What does preventive care include?  A yearly physical exam. This is also called an annual well check.  Dental exams once or twice a year.  Routine eye exams. Ask your health care provider how often you should have your eyes checked.  Personal lifestyle choices, including:  Daily care of your teeth and gums.  Regular physical activity.  Eating a healthy diet.  Avoiding tobacco and drug use.  Limiting alcohol use.  Practicing safe sex.  Taking low-dose aspirin every day.  Taking vitamin and mineral supplements as recommended by your health care provider. What happens during an annual well check? The services and screenings done by your health care provider during your annual well check will depend on your age, overall health,  lifestyle risk factors, and family history of disease. Counseling  Your health care provider may ask you questions about your:  Alcohol use.  Tobacco use.  Drug use.  Emotional well-being.  Home and relationship well-being.  Sexual activity.  Eating habits.  History of falls.  Memory and ability to understand (cognition).  Work and work Statistician.  Reproductive health. Screening  You may have the following tests or measurements:  Height, weight, and BMI.  Blood pressure.  Lipid and cholesterol levels. These may be checked every 5 years, or more frequently if you are over 55 years old.  Skin check.  Lung cancer screening. You may have this screening every year starting at age 39 if you have a 30-pack-year history of smoking and currently smoke or have quit within the past 15 years.  Fecal occult blood test (FOBT) of the stool. You may have this test every year starting at age 31.  Flexible sigmoidoscopy or colonoscopy. You may have a sigmoidoscopy every 5 years or a colonoscopy every 10 years starting at age 56.  Hepatitis C blood test.  Hepatitis B blood test.  Sexually transmitted disease (STD) testing.  Diabetes screening. This is done by checking your blood sugar (glucose) after you have not eaten for a while (fasting). You may have this done every 1-3 years.  Bone density scan. This is done to screen for osteoporosis. You may have this done starting at age 76.  Mammogram. This may be done every 1-2 years. Talk to your health care provider about how often you should have regular mammograms. Talk with your health care provider about your test results, treatment options, and if  necessary, the need for more tests. Vaccines  Your health care provider may recommend certain vaccines, such as:  Influenza vaccine. This is recommended every year.  Tetanus, diphtheria, and acellular pertussis (Tdap, Td) vaccine. You may need a Td booster every 10 years.  Zoster  vaccine. You may need this after age 24.  Pneumococcal 13-valent conjugate (PCV13) vaccine. One dose is recommended after age 27.  Pneumococcal polysaccharide (PPSV23) vaccine. One dose is recommended after age 23. Talk to your health care provider about which screenings and vaccines you need and how often you need them. This information is not intended to replace advice given to you by your health care provider. Make sure you discuss any questions you have with your health care provider. Document Released: 03/01/2015 Document Revised: 10/23/2015 Document Reviewed: 12/04/2014 Elsevier Interactive Patient Education  2017 [place] Prevention in the Home Falls can cause injuries. They can happen to people of all ages. There are many things you can do to make your home safe and to help prevent falls. What can I do on the outside of my home?  Regularly fix the edges of walkways and driveways and fix any cracks.  Remove anything that might make you trip as you walk through a door, such as a raised step or threshold.  Trim any bushes or trees on the path to your home.  Use bright outdoor lighting.  Clear any walking paths of anything that might make someone trip, such as rocks or tools.  Regularly check to see if handrails are loose or broken. Make sure that both sides of any steps have handrails.  Any raised decks and porches should have guardrails on the edges.  Have any leaves, snow, or ice cleared regularly.  Use sand or salt on walking paths during winter.  Clean up any spills in your garage right away. This includes oil or grease spills. What can I do in the bathroom?  Use night lights.  Install grab bars by the toilet and in the tub and shower. Do not use towel bars as grab bars.  Use non-skid mats or decals in the tub or shower.  If you need to sit down in the shower, use a plastic, non-slip stool.  Keep the floor dry. Clean up any water that spills on the  floor as soon as it happens.  Remove soap buildup in the tub or shower regularly.  Attach bath mats securely with double-sided non-slip rug tape.  Do not have throw rugs and other things on the floor that can make you trip. What can I do in the bedroom?  Use night lights.  Make sure that you have a light by your bed that is easy to reach.  Do not use any sheets or blankets that are too big for your bed. They should not hang down onto the floor.  Have a firm chair that has side arms. You can use this for support while you get dressed.  Do not have throw rugs and other things on the floor that can make you trip. What can I do in the kitchen?  Clean up any spills right away.  Avoid walking on wet floors.  Keep items that you use a lot in easy-to-reach places.  If you need to reach something above you, use a strong step stool that has a grab bar.  Keep electrical cords out of the way.  Do not use floor polish or wax that makes floors slippery. If you must  use wax, use non-skid floor wax.  Do not have throw rugs and other things on the floor that can make you trip. What can I do with my stairs?  Do not leave any items on the stairs.  Make sure that there are handrails on both sides of the stairs and use them. Fix handrails that are broken or loose. Make sure that handrails are as long as the stairways.  Check any carpeting to make sure that it is firmly attached to the stairs. Fix any carpet that is loose or worn.  Avoid having throw rugs at the top or bottom of the stairs. If you do have throw rugs, attach them to the floor with carpet tape.  Make sure that you have a light switch at the top of the stairs and the bottom of the stairs. If you do not have them, ask someone to add them for you. What else can I do to help prevent falls?  Wear shoes that:  Do not have high heels.  Have rubber bottoms.  Are comfortable and fit you well.  Are closed at the toe. Do not wear  sandals.  If you use a stepladder:  Make sure that it is fully opened. Do not climb a closed stepladder.  Make sure that both sides of the stepladder are locked into place.  Ask someone to hold it for you, if possible.  Clearly mark and make sure that you can see:  Any grab bars or handrails.  First and last steps.  Where the edge of each step is.  Use tools that help you move around (mobility aids) if they are needed. These include:  Canes.  Walkers.  Scooters.  Crutches.  Turn on the lights when you go into a dark area. Replace any light bulbs as soon as they burn out.  Set up your furniture so you have a clear path. Avoid moving your furniture around.  If any of your floors are uneven, fix them.  If there are any pets around you, be aware of where they are.  Review your medicines with your doctor. Some medicines can make you feel dizzy. This can increase your chance of falling. Ask your doctor what other things that you can do to help prevent falls. This information is not intended to replace advice given to you by your health care provider. Make sure you discuss any questions you have with your health care provider. Document Released: 11/29/2008 Document Revised: 07/11/2015 Document Reviewed: 03/09/2014 Elsevier Interactive Patient Education  2017 [employer].

## 2017-03-16 ENCOUNTER — Ambulatory Visit (INDEPENDENT_AMBULATORY_CARE_PROVIDER_SITE_OTHER): Payer: Medicare Other | Admitting: Family Medicine

## 2017-03-16 VITALS — BP 140/58 | HR 88 | Temp 97.7°F | Resp 14 | Wt 171.0 lb

## 2017-03-16 DIAGNOSIS — E7800 Pure hypercholesterolemia, unspecified: Secondary | ICD-10-CM

## 2017-03-16 DIAGNOSIS — B351 Tinea unguium: Secondary | ICD-10-CM | POA: Diagnosis not present

## 2017-03-16 DIAGNOSIS — I10 Essential (primary) hypertension: Secondary | ICD-10-CM

## 2017-03-16 DIAGNOSIS — E119 Type 2 diabetes mellitus without complications: Secondary | ICD-10-CM

## 2017-03-16 DIAGNOSIS — E1142 Type 2 diabetes mellitus with diabetic polyneuropathy: Secondary | ICD-10-CM | POA: Diagnosis not present

## 2017-03-16 LAB — POCT UA - MICROALBUMIN: Microalbumin Ur, POC: 20 mg/L

## 2017-03-16 LAB — POCT GLYCOSYLATED HEMOGLOBIN (HGB A1C): Hemoglobin A1C: 7.2

## 2017-03-16 NOTE — Progress Notes (Signed)
Patient: Taylor Snyder Female    DOB: 05/22/1928   84 y.o.   MRN: [id number] Visit Date: 03/16/2017  Today's Provider: [name], MD   Chief Complaint  Patient presents with  . Diabetes  . Hypertension   Subjective:    HPI  Diabetes Mellitus Type II, Follow-up:   Lab Results  Component Value Date   HGBA1C 7.2 09/28/2016   HGBA1C 7.5 05/26/2016   HGBA1C 7.3 01/02/2016    Last seen for diabetes 5 months ago.  Management since then includes none. She reports good compliance with treatment. She is not having side effects.  Home blood sugar records: not being checked  Episodes of hypoglycemia? no   Current Insulin Regimen: n/a Most Recent Eye Exam: 06/24/16 Weight trend: stable Current exercise: walking  Pertinent Labs:    Component Value Date/Time   CHOL 148 05/27/2016 0922   TRIG 101 05/27/2016 0922   HDL 67 05/27/2016 0922   LDLCALC 61 05/27/2016 0922   CREATININE 0.63 08/12/2016 1205    Wt Readings from Last 3 Encounters:  03/16/17 171 lb (77.6 kg)  03/04/17 174 lb 3.2 oz (79 kg)  10/27/16 164 lb (74.4 kg)    ------------------------------------------------------------------------   Hypertension, follow-up:  BP Readings from Last 3 Encounters:  03/16/17 (!) 140/58  03/04/17 138/62  10/27/16 (!) 158/58    She was last seen for hypertension 5 months ago.  BP at that visit was 140/58. Management since that visit includes none. She reports good compliance with treatment. She is not having side effects.  She is exercising. She is adherent to low salt diet.   Outside blood pressures are not being checked. Patient denies chest pain, chest pressure/discomfort, claudication, dyspnea, exertional chest pressure/discomfort, fatigue, irregular heart beat, lower extremity edema, near-syncope, orthopnea, palpitations, paroxysmal nocturnal dyspnea, syncope and tachypnea.   Cardiovascular risk factors include advanced age (older than 47 for men, 53  for women), diabetes mellitus, dyslipidemia, hypertension, obesity (BMI >= 30 kg/m2) and sedentary lifestyle.  U Wt Readings from Last 3 Encounters:  03/16/17 171 lb (77.6 kg)  03/04/17 174 lb 3.2 oz (79 kg)  10/27/16 164 lb (74.4 kg)   ------------------------------------------------------------------------         Allergies  Allergen Reactions  . Codeine Sulfate Other (See Comments)    Altered mental status  . Codeine Other (See Comments)    Other Reaction: insomnia Unable to sleep, hypes her up     Current Outpatient Medications:  .  acetaminophen (TYLENOL 8 HOUR) 650 MG CR tablet, Take 650 mg by mouth daily as needed for pain., Disp: , Rfl:  .  ADVAIR DISKUS 100-50 MCG/DOSE AEPB, INHALE 1 PUFF INTO THE LUNGS TWICE DAILY, Disp: 3 each, Rfl: 3 .  Calcium Carbonate-Vitamin D 600-400 MG-UNIT per tablet, Take 1 tablet by mouth daily. , Disp: , Rfl:  .  cetirizine (ZYRTEC) 10 MG tablet, Take 10 mg by mouth at bedtime as needed for allergies. , Disp: , Rfl:  .  Cholecalciferol (VITAMIN D3) 2000 units TABS, Take by mouth., Disp: , Rfl:  .  enalapril (VASOTEC) 5 MG tablet, TAKE ONE TABLET BY MOUTH EVERY DAY., Disp: 90 tablet, Rfl: 3 .  ferrous sulfate 324 (65 Fe) MG TBEC, TAKE 1 TABLET BY MOUTH TWICE DAILY, Disp: 60 tablet, Rfl: 5 .  fluticasone (FLONASE) 50 MCG/ACT nasal spray, USE 2 SPRAYS IN EACH NOSTRIL ONCE DAILY (Patient taking differently: USE 1 SPRAYS IN EACH NOSTRIL ONCE DAILY AS  NEEDED FOR ALLERGIES), Disp: 16 g, Rfl: 12 .  glucose blood (ACCU-CHEK COMPACT PLUS) test strip, Check sugar once daily DX E11.9, Disp: 102 each, Rfl: 12 .  metFORMIN (GLUCOPHAGE) 500 MG tablet, TAKE 2 TABLETS BY MOUTH EVERY DAY (Patient taking differently: TAKE 1 TABLETS BY MOUTH TWICE DAILY), Disp: 60 tablet, Rfl: 11 .  Multiple Vitamins-Minerals (CENTRUM SILVER) tablet, Take 1 tablet by mouth daily. , Disp: , Rfl:  .  omeprazole (PRILOSEC) 20 MG capsule, TAKE 1 CAPSULE(20 MG) BY MOUTH DAILY, Disp:  30 capsule, Rfl: 12 .  pravastatin (PRAVACHOL) 40 MG tablet, TAKE ONE TABLET BY MOUTH NIGHTLY AT BEDTIME, Disp: 30 tablet, Rfl: 12 .  raloxifene (EVISTA) 60 MG tablet, TAKE ONE TABLET BY MOUTH EVERY DAY, Disp: 30 tablet, Rfl: 11 .  XARELTO 20 MG TABS tablet, TAKE 1 TABLET BY MOUTH EVERY DAY, Disp: 90 tablet, Rfl: 0  Review of Systems  Constitutional: Negative.   HENT: Negative.   Eyes: Negative.   Respiratory: Negative.   Cardiovascular: Negative.   Gastrointestinal: Negative.   Endocrine: Negative.   Genitourinary: Negative.   Musculoskeletal: Negative.   Skin: Negative.   Allergic/Immunologic: Negative.   Neurological: Negative.   Hematological: Negative.   Psychiatric/Behavioral: Negative.     Social History   Tobacco Use  . Smoking status: Never Smoker  . Smokeless tobacco: Never Used  Substance Use Topics  . Alcohol use: No   Objective:   BP (!) 140/58 (BP Location: Left Arm, Patient Position: Sitting, Cuff Size: Normal)   Pulse 88   Temp 97.7 F (36.5 C) (Oral)   Resp 14   Wt 171 lb (77.6 kg)   BMI 31.28 kg/m  Vitals:   03/16/17 1510  BP: (!) 140/58  Pulse: 88  Resp: 14  Temp: 97.7 F (36.5 C)  TempSrc: Oral  Weight: 171 lb (77.6 kg)     Physical Exam  Constitutional: She is oriented to person, place, and time. She appears well-developed and well-nourished.  Eyes: Conjunctivae and EOM are normal. Pupils are equal, round, and reactive to light.  Neck: Normal range of motion. Neck supple.  Cardiovascular: Normal rate, regular rhythm, normal heart sounds and intact distal pulses.  Pulmonary/Chest: Effort normal and breath sounds normal.  Musculoskeletal: Normal range of motion.  Neurological: She is alert and oriented to person, place, and time. She has normal reflexes.  Skin: Skin is warm and dry.  Psychiatric: She has a normal mood and affect. Her behavior is normal. Judgment and thought content normal.        Assessment & Plan:     1. Type 2  diabetes mellitus without complication, without long-term current use of insulin (HCC)  - POCT HgB A1C 7.2 today. Stable follow up in 4 months - POCT UA - Microalbumin  2. Essential hypertension   3. Pure hypercholesterolemia      HPI, Exam, and A&P Transcribed under the direction and in the presence of Bingham Millette L. Cranford Mon, MD  Electronically Signed: Katina Dung, CMA  I have done the exam and reviewed the above chart and it is accurate to the best of my knowledge. Development worker, community has been used in this note in any air is in the dictation or transcription are unintentional.  [name], MD  [place]

## 2017-04-01 DIAGNOSIS — L821 Other seborrheic keratosis: Secondary | ICD-10-CM | POA: Diagnosis not present

## 2017-04-01 DIAGNOSIS — D1801 Hemangioma of skin and subcutaneous tissue: Secondary | ICD-10-CM | POA: Diagnosis not present

## 2017-04-01 DIAGNOSIS — L570 Actinic keratosis: Secondary | ICD-10-CM | POA: Diagnosis not present

## 2017-05-19 DIAGNOSIS — E119 Type 2 diabetes mellitus without complications: Secondary | ICD-10-CM | POA: Diagnosis not present

## 2017-05-19 LAB — HM DIABETES EYE EXAM

## 2017-05-20 ENCOUNTER — Encounter: Payer: Self-pay | Admitting: Cardiology

## 2017-06-01 ENCOUNTER — Encounter: Payer: Self-pay | Admitting: Family Medicine

## 2017-06-15 DIAGNOSIS — B351 Tinea unguium: Secondary | ICD-10-CM | POA: Diagnosis not present

## 2017-06-15 DIAGNOSIS — E1142 Type 2 diabetes mellitus with diabetic polyneuropathy: Secondary | ICD-10-CM | POA: Diagnosis not present

## 2017-06-22 ENCOUNTER — Other Ambulatory Visit: Payer: Self-pay | Admitting: Family Medicine

## 2017-07-14 ENCOUNTER — Ambulatory Visit: Payer: Self-pay | Admitting: Family Medicine

## 2017-07-19 ENCOUNTER — Ambulatory Visit (INDEPENDENT_AMBULATORY_CARE_PROVIDER_SITE_OTHER): Payer: Medicare Other | Admitting: Family Medicine

## 2017-07-19 ENCOUNTER — Encounter: Payer: Self-pay | Admitting: Family Medicine

## 2017-07-19 VITALS — BP 124/58 | HR 82 | Temp 98.7°F | Resp 16 | Wt 171.0 lb

## 2017-07-19 DIAGNOSIS — E7800 Pure hypercholesterolemia, unspecified: Secondary | ICD-10-CM

## 2017-07-19 DIAGNOSIS — I10 Essential (primary) hypertension: Secondary | ICD-10-CM

## 2017-07-19 DIAGNOSIS — E119 Type 2 diabetes mellitus without complications: Secondary | ICD-10-CM

## 2017-07-19 NOTE — Progress Notes (Signed)
Patient: Taylor Snyder Female    DOB: May 30, 1928   84 y.o.   MRN: [id number] Visit Date: 07/19/2017  Today's Provider: [name], MD   Chief Complaint  Patient presents with  . Diabetes   Subjective:    HPI  Pt is due for routine labs and will gets a1c added with it. She is maried,1 daughter,1 grandson,new greatgrandson that is 41 months old.   Diabetes Mellitus Type II, Follow-up:   Lab Results  Component Value Date   HGBA1C 7.2 03/16/2017   HGBA1C 7.2 09/28/2016   HGBA1C 7.5 05/26/2016    Last seen for diabetes 4 months ago.  Management since then includes none. She reports good compliance with treatment. She is not having side effects.  Home blood sugar records: 150's  Episodes of hypoglycemia? no   Current Insulin Regimen: n/a Most Recent Eye Exam: 05/19/17 Current exercise: gardening and is still very active for 84 years old  Pertinent Labs:    Component Value Date/Time   CHOL 148 05/27/2016 0922   TRIG 101 05/27/2016 0922   HDL 67 05/27/2016 0922   LDLCALC 61 05/27/2016 0922   CREATININE 0.63 08/12/2016 1205    Wt Readings from Last 3 Encounters:  07/19/17 171 lb (77.6 kg)  03/16/17 171 lb (77.6 kg)  03/04/17 174 lb 3.2 oz (79 kg)    ------------------------------------------------------------------------     Allergies  Allergen Reactions  . Codeine Sulfate Other (See Comments)    Altered mental status  . Codeine Other (See Comments)    Other Reaction: insomnia Unable to sleep, hypes her up     Current Outpatient Medications:  .  acetaminophen (TYLENOL 8 HOUR) 650 MG CR tablet, Take 650 mg by mouth daily as needed for pain., Disp: , Rfl:  .  ADVAIR DISKUS 100-50 MCG/DOSE AEPB, INHALE 1 PUFF INTO THE LUNGS TWICE DAILY, Disp: 3 each, Rfl: 3 .  Calcium Carbonate-Vitamin D 600-400 MG-UNIT per tablet, Take 1 tablet by mouth daily. , Disp: , Rfl:  .  cetirizine (ZYRTEC) 10 MG tablet, Take 10 mg by mouth at bedtime as needed for  allergies. , Disp: , Rfl:  .  Cholecalciferol (VITAMIN D3) 2000 units TABS, Take by mouth., Disp: , Rfl:  .  ferrous sulfate 324 (65 Fe) MG TBEC, TAKE 1 TABLET BY MOUTH TWICE DAILY, Disp: 60 tablet, Rfl: 5 .  fluticasone (FLONASE) 50 MCG/ACT nasal spray, USE 2 SPRAYS IN EACH NOSTRIL ONCE DAILY (Patient taking differently: USE 1 SPRAYS IN EACH NOSTRIL ONCE DAILY AS NEEDED FOR ALLERGIES), Disp: 16 g, Rfl: 12 .  glucose blood (ACCU-CHEK COMPACT PLUS) test strip, Check sugar once daily DX E11.9, Disp: 102 each, Rfl: 12 .  metFORMIN (GLUCOPHAGE) 500 MG tablet, TAKE 2 TABLETS BY MOUTH EVERY DAY (Patient taking differently: TAKE 1 TABLETS BY MOUTH TWICE DAILY), Disp: 60 tablet, Rfl: 11 .  Multiple Vitamins-Minerals (CENTRUM SILVER) tablet, Take 1 tablet by mouth daily. , Disp: , Rfl:  .  omeprazole (PRILOSEC) 20 MG capsule, TAKE 1 CAPSULE(20 MG) BY MOUTH DAILY, Disp: 30 capsule, Rfl: 12 .  pravastatin (PRAVACHOL) 40 MG tablet, TAKE ONE TABLET BY MOUTH NIGHTLY AT BEDTIME, Disp: 30 tablet, Rfl: 12 .  raloxifene (EVISTA) 60 MG tablet, TAKE ONE TABLET BY MOUTH EVERY DAY, Disp: 30 tablet, Rfl: 11 .  XARELTO 20 MG TABS tablet, TAKE 1 TABLET BY MOUTH EVERY DAY, Disp: 90 tablet, Rfl: 0 .  enalapril (VASOTEC) 5 MG tablet, TAKE ONE  TABLET BY MOUTH EVERY DAY., Disp: 90 tablet, Rfl: 3  Review of Systems  Constitutional: Negative.   HENT: Negative.   Eyes: Negative.   Respiratory: Negative.   Cardiovascular: Negative.   Gastrointestinal: Negative.   Endocrine: Negative.   Genitourinary: Negative.   Musculoskeletal: Positive for arthralgias.  Skin: Negative.   Allergic/Immunologic: Negative.   Neurological: Negative.   Hematological: Negative.   Psychiatric/Behavioral: Negative.     Social History   Tobacco Use  . Smoking status: Never Smoker  . Smokeless tobacco: Never Used  Substance Use Topics  . Alcohol use: No   Objective:   BP (!) 124/58 (BP Location: Left Arm, Patient Position: Sitting, Cuff  Size: Normal)   Pulse 82   Temp 98.7 F (37.1 C) (Oral)   Resp 16   Wt 171 lb (77.6 kg)   BMI 31.28 kg/m  Vitals:   07/19/17 1330  BP: (!) 124/58  Pulse: 82  Resp: 16  Temp: 98.7 F (37.1 C)  TempSrc: Oral  Weight: 171 lb (77.6 kg)     Physical Exam  Constitutional: She is oriented to person, place, and time. She appears well-developed and well-nourished.  HENT:  Head: Normocephalic and atraumatic.  Eyes: Conjunctivae are normal. No scleral icterus.  Neck: No thyromegaly present.  Cardiovascular: Normal rate, regular rhythm and normal heart sounds.  Pulmonary/Chest: Effort normal.  Abdominal: Soft.  Musculoskeletal: She exhibits no edema.  Neurological: She is oriented to person, place, and time.  Skin: Skin is warm and dry.  Psychiatric: She has a normal mood and affect. Her behavior is normal. Judgment and thought content normal.        Assessment & Plan:     1. Essential hypertension  - CBC with Differential/Platelet - TSH  2. Type 2 diabetes mellitus without complication, without long-term current use of insulin (HCC)  - Hemoglobin A1c  3. Pure hypercholesterolemia  - Lipid panel - Comprehensive metabolic panel 4.Pacemaker     I have done the exam and reviewed the above chart and it is accurate to the best of my knowledge. Development worker, community has been used in this note in any air is in the dictation or transcription are unintentional.  [name], MD  [place]

## 2017-07-20 DIAGNOSIS — E119 Type 2 diabetes mellitus without complications: Secondary | ICD-10-CM | POA: Diagnosis not present

## 2017-07-20 DIAGNOSIS — I10 Essential (primary) hypertension: Secondary | ICD-10-CM | POA: Diagnosis not present

## 2017-07-20 DIAGNOSIS — E7800 Pure hypercholesterolemia, unspecified: Secondary | ICD-10-CM | POA: Diagnosis not present

## 2017-07-21 ENCOUNTER — Telehealth: Payer: Self-pay

## 2017-07-21 LAB — HEMOGLOBIN A1C
Est. average glucose Bld gHb Est-mCnc: 169 mg/dL
HEMOGLOBIN A1C: 7.5 % — AB (ref 4.8–5.6)

## 2017-07-21 LAB — COMPREHENSIVE METABOLIC PANEL
A/G RATIO: 1.7 (ref 1.2–2.2)
ALK PHOS: 96 IU/L (ref 39–117)
ALT: 14 IU/L (ref 0–32)
AST: 15 IU/L (ref 0–40)
Albumin: 4 g/dL (ref 3.5–4.7)
BUN/Creatinine Ratio: 15 (ref 12–28)
BUN: 10 mg/dL (ref 8–27)
Bilirubin Total: 0.3 mg/dL (ref 0.0–1.2)
CALCIUM: 9.1 mg/dL (ref 8.7–10.3)
CO2: 23 mmol/L (ref 20–29)
CREATININE: 0.67 mg/dL (ref 0.57–1.00)
Chloride: 100 mmol/L (ref 96–106)
GFR calc Af Amer: 91 mL/min/{1.73_m2} (ref >59)
GFR calc non Af Amer: 79 mL/min/{1.73_m2} (ref >59)
GLOBULIN, TOTAL: 2.3 g/dL (ref 1.5–4.5)
Glucose: 175 mg/dL — ABNORMAL HIGH (ref 65–99)
POTASSIUM: 4.7 mmol/L (ref 3.5–5.2)
SODIUM: 139 mmol/L (ref 134–144)
Total Protein: 6.3 g/dL (ref 6.0–8.5)

## 2017-07-21 LAB — LIPID PANEL
CHOL/HDL RATIO: 2.5 ratio (ref 0.0–4.4)
Cholesterol, Total: 163 mg/dL (ref 100–199)
HDL: 66 mg/dL (ref >39)
LDL CALC: 71 mg/dL (ref 0–99)
TRIGLYCERIDES: 132 mg/dL (ref 0–149)
VLDL Cholesterol Cal: 26 mg/dL (ref 5–40)

## 2017-07-21 LAB — CBC WITH DIFFERENTIAL/PLATELET
Basophils Absolute: 0 10*3/uL (ref 0.0–0.2)
Basos: 0 %
EOS (ABSOLUTE): 0.1 10*3/uL (ref 0.0–0.4)
Eos: 1 %
Hematocrit: 38.8 % (ref 34.0–46.6)
Hemoglobin: 13.5 g/dL (ref 11.1–15.9)
IMMATURE GRANS (ABS): 0 10*3/uL (ref 0.0–0.1)
Immature Granulocytes: 0 %
LYMPHS ABS: 2.3 10*3/uL (ref 0.7–3.1)
Lymphs: 33 %
MCH: 32 pg (ref 26.6–33.0)
MCHC: 34.8 g/dL (ref 31.5–35.7)
MCV: 92 fL (ref 79–97)
MONOS ABS: 0.7 10*3/uL (ref 0.1–0.9)
Monocytes: 9 %
NEUTROS ABS: 3.8 10*3/uL (ref 1.4–7.0)
Neutrophils: 57 %
PLATELETS: 215 10*3/uL (ref 150–450)
RBC: 4.22 x10E6/uL (ref 3.77–5.28)
RDW: 13.9 % (ref 12.3–15.4)
WBC: 6.9 10*3/uL (ref 3.4–10.8)

## 2017-07-21 LAB — TSH: TSH: 4.19 u[IU]/mL (ref 0.450–4.500)

## 2017-07-21 NOTE — Telephone Encounter (Signed)
-----   Message from Jerrol Banana., MD sent at 07/21/2017  1:39 PM EDT ----- Stable.

## 2017-07-21 NOTE — Telephone Encounter (Signed)
LMTCB 07/21/2017  Thanks,   -Mickel Baas

## 2017-07-27 NOTE — Telephone Encounter (Signed)
Pt informed and voiced understanding of results. 

## 2017-07-28 ENCOUNTER — Other Ambulatory Visit: Payer: Self-pay | Admitting: Family Medicine

## 2017-07-28 DIAGNOSIS — E7800 Pure hypercholesterolemia, unspecified: Secondary | ICD-10-CM

## 2017-07-28 MED ORDER — PRAVASTATIN SODIUM 40 MG PO TABS: 40.0000 mg | ORAL_TABLET | Freq: Every day | ORAL | 3 refills | Status: DC

## 2017-07-28 NOTE — Telephone Encounter (Signed)
Sent!

## 2017-07-28 NOTE — Telephone Encounter (Signed)
[facility] faxed a refill request for the following medication. Thanks [facility]  pravastatin (PRAVACHOL) 40 MG tablet

## 2017-08-04 DIAGNOSIS — I10 Essential (primary) hypertension: Secondary | ICD-10-CM | POA: Diagnosis not present

## 2017-08-04 DIAGNOSIS — I480 Paroxysmal atrial fibrillation: Secondary | ICD-10-CM | POA: Diagnosis not present

## 2017-08-04 DIAGNOSIS — E782 Mixed hyperlipidemia: Secondary | ICD-10-CM | POA: Diagnosis not present

## 2017-08-04 DIAGNOSIS — I495 Sick sinus syndrome: Secondary | ICD-10-CM | POA: Diagnosis not present

## 2017-09-04 ENCOUNTER — Other Ambulatory Visit: Payer: Self-pay | Admitting: Family Medicine

## 2017-09-11 ENCOUNTER — Other Ambulatory Visit: Payer: Self-pay | Admitting: Family Medicine

## 2017-09-22 DIAGNOSIS — E1142 Type 2 diabetes mellitus with diabetic polyneuropathy: Secondary | ICD-10-CM | POA: Diagnosis not present

## 2017-09-22 DIAGNOSIS — B351 Tinea unguium: Secondary | ICD-10-CM | POA: Diagnosis not present

## 2017-09-22 DIAGNOSIS — L851 Acquired keratosis [keratoderma] palmaris et plantaris: Secondary | ICD-10-CM | POA: Diagnosis not present

## 2017-11-12 ENCOUNTER — Other Ambulatory Visit: Payer: Self-pay | Admitting: Family Medicine

## 2017-11-12 DIAGNOSIS — M1990 Unspecified osteoarthritis, unspecified site: Secondary | ICD-10-CM

## 2017-11-19 ENCOUNTER — Other Ambulatory Visit: Payer: Self-pay | Admitting: Family Medicine

## 2017-11-19 DIAGNOSIS — I10 Essential (primary) hypertension: Secondary | ICD-10-CM

## 2017-11-30 ENCOUNTER — Ambulatory Visit (INDEPENDENT_AMBULATORY_CARE_PROVIDER_SITE_OTHER): Payer: Medicare Other | Admitting: Family Medicine

## 2017-11-30 ENCOUNTER — Encounter: Payer: Self-pay | Admitting: Family Medicine

## 2017-11-30 VITALS — BP 126/72 | HR 74 | Temp 98.6°F | Resp 16 | Ht 62.0 in | Wt 170.0 lb

## 2017-11-30 DIAGNOSIS — E119 Type 2 diabetes mellitus without complications: Secondary | ICD-10-CM | POA: Diagnosis not present

## 2017-11-30 DIAGNOSIS — I10 Essential (primary) hypertension: Secondary | ICD-10-CM | POA: Diagnosis not present

## 2017-11-30 DIAGNOSIS — N3090 Cystitis, unspecified without hematuria: Secondary | ICD-10-CM

## 2017-11-30 DIAGNOSIS — E7800 Pure hypercholesterolemia, unspecified: Secondary | ICD-10-CM

## 2017-11-30 DIAGNOSIS — Z23 Encounter for immunization: Secondary | ICD-10-CM | POA: Diagnosis not present

## 2017-11-30 LAB — POCT URINALYSIS DIPSTICK
BILIRUBIN UA: NEGATIVE
GLUCOSE UA: NEGATIVE
Ketones, UA: NEGATIVE
Leukocytes, UA: NEGATIVE
Nitrite, UA: NEGATIVE
Protein, UA: NEGATIVE
RBC UA: NEGATIVE
SPEC GRAV UA: 1.02 (ref 1.010–1.025)
Urobilinogen, UA: 0.2 E.U./dL
pH, UA: 7.5 (ref 5.0–8.0)

## 2017-11-30 LAB — POCT GLYCOSYLATED HEMOGLOBIN (HGB A1C): Hemoglobin A1C: 7.7 % — AB (ref 4.0–5.6)

## 2017-11-30 NOTE — Progress Notes (Signed)
Patient: Taylor Snyder Female    DOB: [birth year]   84 y.o.   MRN: [id number] Visit Date: 11/30/2017  Today's Provider: [name], MD   Chief Complaint  Patient presents with  . Diabetes  . Hypertension   Subjective:    HPI   Diabetes Mellitus Type II, Follow-up:   Lab Results  Component Value Date   HGBA1C 7.7 (A) 11/30/2017   HGBA1C 7.5 (H) 07/20/2017   HGBA1C 7.2 03/16/2017    Last seen for diabetes 4 months ago.  Management since then includes no changes. She reports good compliance with treatment. She is not having side effects.  Current symptoms include none and have been stable. Home blood sugar records: fasting range: 142 this morning.  Episodes of hypoglycemia? no   Current Insulin Regimen: none Most Recent Eye Exam: up to date Weight trend: stable Prior visit with dietician: no Current diet: well balanced Current exercise: no regular exercise, but she does stay active.   Pertinent Labs:    Component Value Date/Time   CHOL 163 07/20/2017 0914   TRIG 132 07/20/2017 0914   HDL 66 07/20/2017 0914   LDLCALC 71 07/20/2017 0914   CREATININE 0.67 07/20/2017 0914    Wt Readings from Last 3 Encounters:  11/30/17 170 lb (77.1 kg)  07/19/17 171 lb (77.6 kg)  03/16/17 171 lb (77.6 kg)         Hypertension, follow-up:  BP Readings from Last 3 Encounters:  11/30/17 126/72  07/19/17 (!) 124/58  03/16/17 (!) 140/58    She was last seen for hypertension 4 months ago.  BP at that visit was 124/58. Management since that visit includes no changes. She reports good compliance with treatment. She is not having side effects.    Allergies  Allergen Reactions  . Codeine Sulfate Other (See Comments)    Altered mental status  . Codeine Other (See Comments)    Other Reaction: insomnia Unable to sleep, hypes her up     Current Outpatient Medications:  .  acetaminophen (TYLENOL 8 HOUR) 650 MG CR tablet, Take 650 mg by mouth daily as  needed for pain., Disp: , Rfl:  .  ADVAIR DISKUS 100-50 MCG/DOSE AEPB, INHALE 1 PUFF INTO THE LUNGS TWICE DAILY, Disp: 3 each, Rfl: 3 .  Calcium Carbonate-Vitamin D 600-400 MG-UNIT per tablet, Take 1 tablet by mouth daily. , Disp: , Rfl:  .  cetirizine (ZYRTEC) 10 MG tablet, Take 10 mg by mouth at bedtime as needed for allergies. , Disp: , Rfl:  .  Cholecalciferol (VITAMIN D3) 2000 units TABS, Take by mouth., Disp: , Rfl:  .  enalapril (VASOTEC) 5 MG tablet, TAKE ONE TABLET BY MOUTH EVERY DAY., Disp: 90 tablet, Rfl: 0 .  ferrous sulfate 324 (65 Fe) MG TBEC, Take 1 tablet (325 mg total) by mouth every morning., Disp: 60 tablet, Rfl: 11 .  fluticasone (FLONASE) 50 MCG/ACT nasal spray, USE 2 SPRAYS IN EACH NOSTRIL ONCE DAILY (Patient taking differently: USE 1 SPRAYS IN EACH NOSTRIL ONCE DAILY AS NEEDED FOR ALLERGIES), Disp: 16 g, Rfl: 12 .  Glucosamine-Chondroitin (OSTEO BI-FLEX REGULAR STRENGTH PO), Take by mouth daily., Disp: , Rfl:  .  glucose blood (ACCU-CHEK COMPACT PLUS) test strip, Check sugar once daily DX E11.9, Disp: 102 each, Rfl: 12 .  metFORMIN (GLUCOPHAGE) 500 MG tablet, TAKE 2 TABLETS BY MOUTH EVERY DAY, Disp: 180 tablet, Rfl: 0 .  Multiple Vitamins-Minerals (CENTRUM SILVER) tablet, Take 1 tablet by  mouth daily. , Disp: , Rfl:  .  omeprazole (PRILOSEC) 20 MG capsule, TAKE 1 CAPSULE(20 MG) BY MOUTH DAILY, Disp: 90 capsule, Rfl: 0 .  pravastatin (PRAVACHOL) 40 MG tablet, Take 1 tablet (40 mg total) by mouth at bedtime., Disp: 90 tablet, Rfl: 3 .  raloxifene (EVISTA) 60 MG tablet, TAKE ONE TABLET BY MOUTH EVERY DAY, Disp: 30 tablet, Rfl: 11 .  XARELTO 20 MG TABS tablet, TAKE 1 TABLET BY MOUTH EVERY DAY, Disp: 90 tablet, Rfl: 3  Review of Systems  Constitutional: Negative.   HENT: Negative.   Respiratory: Negative.   Cardiovascular: Negative.   Endocrine: Negative.   Genitourinary: Positive for enuresis.  Musculoskeletal: Negative.   Allergic/Immunologic: Negative.   Neurological:  Negative.   Hematological: Negative.   Psychiatric/Behavioral: Negative.     Social History   Tobacco Use  . Smoking status: Never Smoker  . Smokeless tobacco: Never Used  Substance Use Topics  . Alcohol use: No   Objective:   BP 126/72   Pulse 74   Temp 98.6 F (37 C)   Resp 16   Ht 5\' 2"  (1.575 m)   Wt 170 lb (77.1 kg)   SpO2 96%   BMI 31.09 kg/m  Vitals:   11/30/17 0852  BP: 126/72  Pulse: 74  Resp: 16  Temp: 98.6 F (37 C)  SpO2: 96%  Weight: 170 lb (77.1 kg)  Height: 5\' 2"  (1.575 m)     Physical Exam  Constitutional: She is oriented to person, place, and time. She appears well-developed and well-nourished.  HENT:  Head: Normocephalic and atraumatic.  Eyes: Conjunctivae are normal. No scleral icterus.  Neck: No thyromegaly present.  Cardiovascular: Normal rate, regular rhythm, normal heart sounds and intact distal pulses.  Pulmonary/Chest: Effort normal and breath sounds normal.  Abdominal: Soft.  Musculoskeletal: She exhibits edema.  1+ LE edema.  Neurological: She is alert and oriented to person, place, and time.  Skin: Skin is warm and dry.  Psychiatric: She has a normal mood and affect. Her behavior is normal. Judgment and thought content normal.        Assessment & Plan:     1. Type 2 diabetes mellitus without complication, without long-term current use of insulin (HCC) Fair control--work on habits. - POCT glycosylated hemoglobin (Hb A1C)7.7 today--was 7.5  2. Essential hypertension Controlled  3. Pure hypercholesterolemia Treated.  4. Need for influenza vaccination  - Flu vaccine HIGH DOSE PF (Fluzone High dose)  5. Cystitis May need further evaluation. - POCT urinalysis dipstick       [name], MD  [facility]

## 2017-12-07 ENCOUNTER — Other Ambulatory Visit: Payer: Self-pay | Admitting: Family Medicine

## 2017-12-27 DIAGNOSIS — L851 Acquired keratosis [keratoderma] palmaris et plantaris: Secondary | ICD-10-CM | POA: Diagnosis not present

## 2017-12-27 DIAGNOSIS — B351 Tinea unguium: Secondary | ICD-10-CM | POA: Diagnosis not present

## 2017-12-27 DIAGNOSIS — E1142 Type 2 diabetes mellitus with diabetic polyneuropathy: Secondary | ICD-10-CM | POA: Diagnosis not present

## 2018-02-01 ENCOUNTER — Ambulatory Visit: Payer: Self-pay | Admitting: Family Medicine

## 2018-02-21 ENCOUNTER — Other Ambulatory Visit: Payer: Self-pay | Admitting: Family Medicine

## 2018-02-21 DIAGNOSIS — M1990 Unspecified osteoarthritis, unspecified site: Secondary | ICD-10-CM

## 2018-02-25 ENCOUNTER — Other Ambulatory Visit: Payer: Self-pay | Admitting: Family Medicine

## 2018-02-25 DIAGNOSIS — I10 Essential (primary) hypertension: Secondary | ICD-10-CM

## 2018-02-25 NOTE — Telephone Encounter (Signed)
[facility] faxed refill request for the following medications:  enalapril (VASOTEC) 5 MG tablet   90 day supply  Please advise. Thanks TNP

## 2018-02-28 MED ORDER — ENALAPRIL MALEATE 5 MG PO TABS: 5.0000 mg | ORAL_TABLET | Freq: Every day | ORAL | 3 refills | Status: DC

## 2018-03-10 ENCOUNTER — Other Ambulatory Visit: Payer: Self-pay | Admitting: Family Medicine

## 2018-03-11 ENCOUNTER — Telehealth: Payer: Self-pay | Admitting: Family Medicine

## 2018-03-11 NOTE — Telephone Encounter (Signed)
[facility] [place], [address] AT [facility] [address]rows [phone] (Phone) [phone] (Fax)   Calling regarding Rx sent in on 03-10-18 for: Wisdom plus drums lancets - they are no longer available.  Asking for a new Rx to be sent in for: Soft Click Lancing device Test strips   Thanks, D[place]

## 2018-03-12 ENCOUNTER — Other Ambulatory Visit: Payer: Self-pay

## 2018-03-12 MED ORDER — ACCU-CHEK SOFTCLIX LANCET DEV KIT: PACK | 0 refills | Status: DC

## 2018-03-15 ENCOUNTER — Ambulatory Visit (INDEPENDENT_AMBULATORY_CARE_PROVIDER_SITE_OTHER): Payer: Medicare Other

## 2018-03-15 VITALS — BP 126/52 | HR 87 | Temp 98.8°F | Ht 62.0 in | Wt 169.6 lb

## 2018-03-15 DIAGNOSIS — M810 Age-related osteoporosis without current pathological fracture: Secondary | ICD-10-CM

## 2018-03-15 DIAGNOSIS — Z0000 Encounter for general adult medical examination without abnormal findings: Secondary | ICD-10-CM

## 2018-03-15 NOTE — Progress Notes (Signed)
Subjective:   Taylor Snyder is a 85 y.o. female who presents for Medicare Annual (Subsequent) preventive examination.  Review of Systems:  N/A  Cardiac Risk Factors include: advanced age (>44mn, >>21women);diabetes mellitus;dyslipidemia;hypertension;obesity (BMI >30kg/m2)     Objective:     Vitals: BP (!) 126/52 (BP Location: Right Arm)   Pulse 87   Temp 98.8 F (37.1 C) (Oral)   Ht '5\' 2"'  (1.575 m)   Wt 169 lb 9.6 oz (76.9 kg)   BMI 31.02 kg/m   Body mass index is 31.02 kg/m.  Advanced Directives 03/15/2018 03/04/2017 08/21/2016 08/12/2016 02/26/2016 12/24/2015 12/31/2014  Does Patient Have a Medical Advance Directive? No No No No No No No  Does patient want to make changes to medical advance directive? - - - - Yes (MAU/Ambulatory/Procedural Areas - Information given) - -  Would patient like information on creating a medical advance directive? No - Patient declined No - Patient declined No - Patient declined Yes (MAU/Ambulatory/Procedural Areas - Information given) - - -    Tobacco Social History   Tobacco Use  Smoking Status Never Smoker  Smokeless Tobacco Never Used     Counseling given: Not Answered   Clinical Intake:  Pre-visit preparation completed: Yes  Pain : No/denies pain Pain Score: 0-No pain    Diabetes:  Is the patient diabetic?  Yes type 2 If diabetic, was a CBG obtained today?  No  Did the patient bring in their glucometer from home?  No  How often do you monitor your CBG's? Once daily.   Financial Strains and Diabetes Management:  Are you having any financial strains with the device, your supplies or your medication? No .  Does the patient want to be seen by Chronic Care Management for management of their diabetes?  No  Would the patient like to be referred to a Nutritionist or for Diabetic Management?  No   Diabetic Exams:  Diabetic Eye Exam: Completed 05/317.   Diabetic Foot Exam: Completed 03/16/17.    Nutritional Status: BMI > 30   Obese Nutritional Risks: None   How often do you need to have someone help you when you read instructions, pamphlets, or other written materials from your doctor or pharmacy?: 1 - Never  Interpreter Needed?: No  Information [facility] :: [facility]  Past Medical History:  Diagnosis Date  . [place]   . Anemia   . Asthma   . Colon polyps 2005  . [place] complication [place] [address] . Dysrhythmia   . GERD (gastroesophageal reflux disease)   . HOH (hard of hearing)    AIDS  . Hyperlipidemia   . Hypertension   . Obesity   . Presence of permanent cardiac pacemaker   . Rheumatic mitral valve failure   . Unspecified atrial fibrillation ([facility]    Past Surgical History:  Procedure Laterality Date  . ABDOMINAL HYSTERECTOMY    . APPENDECTOMY    . CATARACT EXTRACTION W/PHACO Right 09/29/2016   Procedure: CATARACT EXTRACTION PHACO AND INTRAOCULAR LENS PLACEMENT (IOC);  Surgeon: PBirder Robson MD;  Location: [facility] ORS;  Service: Ophthalmology;  Laterality: Right;  [place] AP% 18.9 CDE 8.69 Fluid pack lot # [id number]  . CATARACT EXTRACTION W/PHACO Left 10/27/2016   Procedure: CATARACT EXTRACTION PHACO AND [place]TRAOCULAR [place]ENS PLACEMENT ([place];  Surgeon: PBirder Robson MD;  Location: [facility] ORS;  Service: Ophthalmology;  Laterality: Left;  [place]:49 AP% 22.4 CDE 11.03 Fluid pack lot # [id number]  . CHOLECYSTECTOMY  2008  . COLONOSCOPY  2005, 2014   Dr Bary Castilla  . FLEXIBLE SIGMOIDOSCOPY  2006  . INSERT / REPLACE / REMOVE PACEMAKER    . PACEMAKER INSERTION Left 08/21/2016   Procedure: PACEMAKER CHANGE OUT;  Surgeon: Marzetta Board, MD;  Location: [facility] ORS;  Service: Cardiovascular;  Laterality: Left;  . PACEMAKER PLACEMENT  2008  . salpingo oophorectmy     . TONSILLECTOMY     Family History  Problem Relation Age of Onset  . Hypertension Mother   . Stroke Father   . Heart disease Brother   . Alcohol abuse Brother    Social History   Socioeconomic History  .  Marital status: Married    Spouse name: Not on file  . Number of children: 1  . Years of education: Not on file  . Highest education level: 12th grade  Occupational History  . Occupation: RETIRED  . 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 Financial resource strain: Not hard at all  . Food insecurity:    Worry: Never true    Inability: Never true  . Transportation needs:    Medical: No    Non-medical: No  Tobacco Use  . Smoking status: Never Smoker  . Smokeless tobacco: Never Used  Substance and Sexual Activity  . Alcohol use: No  . Drug use: No  . Sexual activity: Never  Lifestyle  . Physical activity:    Days per week: 0 days    Minutes per session: 0 min  . Stress: Not at all  Relationships  . Social connections:    Talks on phone: Patient refused    Gets together: Patient refused    Attends religious service: Patient refused    Active member of club or organization: Patient refused    Attends meetings of clubs or organizations: Patient refused    Relationship status: Patient refused  Other Topics Concern  . Not on file  Social History Narrative  . Not on file    Outpatient Encounter Medications as of 03/15/2018  Medication Sig  . acetaminophen (TYLENOL 8 HOUR) 650 MG CR tablet Take 650 mg by mouth daily as needed for pain.  . Calcium Carbonate-Vitamin D 600-400 MG-UNIT per tablet Take 1 tablet by mouth daily.   . cetirizine (ZYRTEC) 10 MG tablet Take 10 mg by mouth at bedtime as needed for allergies.   . Cholecalciferol (VITAMIN D3) 2000 units TABS Take by mouth.  . enalapril (VASOTEC) 5 MG tablet Take 1 tablet (5 mg total) by mouth daily.  . Ferrous Fumarate (FERROCITE) 324 (106 Fe) MG TABS tablet TK 1 T PO QAM  . fluticasone (FLONASE) 50 MCG/ACT nasal spray USE 2 SPRAYS IN EACH NOSTRIL ONCE DAILY (Patient taking differently: USE 1 SPRAYS IN EACH NOSTRIL ONCE DAILY AS NEEDED FOR ALLERGIES)  . Glucosamine-Chondroitin (OSTEO BI-FLEX REGULAR  STRENGTH PO) Take by mouth daily.  Marland Kitchen glucose blood test strip Use to check glucose daily and as needed  . Lancets Misc. (ACCU-CHEK SOFTCLIX LANCET DEV) KIT Dispense 1 kit  . metFORMIN (GLUCOPHAGE) 500 MG tablet TAKE 2 TABLETS BY MOUTH EVERY DAY  . Multiple Vitamins-Minerals (CENTRUM SILVER) tablet Take 1 tablet by mouth daily.   Marland Kitchen omeprazole (PRILOSEC) 20 MG capsule TAKE 1 CAPSULE(20 MG) BY MOUTH DAILY  . pravastatin (PRAVACHOL) 40 MG tablet Take 1 tablet (40 mg total) by mouth at bedtime.  . raloxifene (EVISTA) 60 MG tablet TAKE ONE TABLET BY MOUTH EVERY DAY  . [place]  INHUB 100-50 MCG/DOSE AEPB INHALE 1 PUFF INTO THE LUNGS TWICE DAILY  . XARELTO 20 MG TABS tablet TAKE 1 TABLET BY MOUTH EVERY DAY  . ferrous sulfate 324 (65 Fe) MG TBEC Take 1 tablet (325 mg total) by mouth every morning. (Patient not taking: Reported on 03/15/2018)   No facility-administered encounter medications on file as of 03/15/2018.     Activities of Daily Living In your present state of health, do you have any difficulty performing the following activities: 03/15/2018  Hearing? Y  Comment Wears bilateral hearing aids.   Vision? N  Comment Wears eye glasses.   Difficulty concentrating or making decisions? N  Walking or climbing stairs? N  Dressing or bathing? N  Doing errands, shopping? N  Preparing Food and eating ? N  Using the Toilet? N  In the past six months, have you accidently leaked urine? Y  Comment Occasionally, wears protection while away from home.   Do you have problems with loss of bowel control? N  Managing your Medications? N  Managing your Finances? N  Housekeeping or managing your Housekeeping? N  Some recent data might be hidden    Patient Care Team: Jerrol Banana., MD as PCP - General (Family Medicine) Corey Skains, MD as Consulting Physician (Cardiology) Birder Robson, MD as Referring Physician (Ophthalmology)    Assessment:   This is a routine wellness examination for  Aubery.  Exercise Activities and Dietary recommendations Current Exercise Habits: The patient does not participate in regular exercise at present, Exercise limited by: None identified  Goals    . Cut out extra servings     Recommend to avoid junk food and if wanting a snack to eat fruits and vegetables instead.    Marland Kitchen DIET - INCREASE WATER INTAKE     Recommend increasing water intake to 6-8 glasses a day.     . Reduce amount of fried foods in diet     Starting 02/26/16, I will try to avoid eating fried foods.       Fall Risk Fall Risk  03/15/2018 03/04/2017 02/26/2016 10/31/2014  Falls in the past year? 0 Yes Yes Yes  Comment - - tripped -  Number falls in past yr: - '1 1 1  ' Injury with Fall? - No No No  Follow up - Falls prevention discussed Falls prevention discussed Falls prevention discussed;Education provided   FALL RISK PREVENTION PERTAINING TO THE HOME:  Any stairs in or around the home WITH handrails? Yes  Home free of loose throw rugs in walkways, pet beds, electrical cords, etc? Yes  Adequate lighting in your home to reduce risk of falls? Yes   ASSISTIVE DEVICES UTILIZED TO PREVENT FALLS:  Life alert? No  Use of a cane, walker or w/c? No  Grab bars in the bathroom? No  Shower chair or bench in shower? Yes  Elevated toilet seat or a handicapped toilet? No    TIMED UP AND GO:  Was the test performed? No .     Depression Screen PHQ 2/9 Scores 03/15/2018 03/04/2017 02/26/2016 10/31/2014  PHQ - 2 Score 0 0 0 0     Cognitive Function: Declined today.      6CIT Screen 02/26/2016  What Year? 0 points  What month? 0 points  What time? 0 points  Count back from 20 0 points  Months in reverse 0 points  Repeat phrase 0 points  Total Score 0    Immunization History  Administered Date(s) Administered  .  Influenza, High Dose Seasonal PF 10/31/2014, 12/24/2015, 12/10/2016, 11/30/2017  . Pneumococcal Conjugate-13 07/19/2013  . Pneumococcal Polysaccharide-23 10/04/1997    . Td 03/01/2009    Qualifies for Shingles Vaccine? Yes . Due for Shingrix. Education has been provided regarding the importance of this vaccine. Pt has been advised to call insurance company to determine out of pocket expense. Advised may also receive vaccine at local pharmacy or Health Dept. Verbalized acceptance and understanding.  Tdap: Up to date  Flu Vaccine: Up to date  Pneumococcal Vaccine: Up to date   Screening Tests Health Maintenance  Topic Date Due  . DEXA SCAN  09/21/2016  . FOOT EXAM  03/16/2018  . OPHTHALMOLOGY EXAM  05/20/2018  . HEMOGLOBIN A1C  06/01/2018  . TETANUS/TDAP  03/02/2019  . INFLUENZA VACCINE  Completed  . PNA vac Low Risk Adult  Completed    Cancer Screenings:  Colorectal Screening: No longer required.   Mammogram: No longer required.   Bone Density: Completed 10/27/11. Results reflect OSTEOPOROSIS. Repeat every 2 years. Ordered today. Pt aware the office will call re: appt.  Lung Cancer Screening: (Low Dose CT Chest recommended if Age 65-80 years, 30 pack-year currently smoking OR have quit w/in 15years.) does not qualify.    Additional Screening:  Vision Screening: Recommended annual ophthalmology exams for early detection of glaucoma and other disorders of the eye.  Dental Screening: Recommended annual dental exams for proper oral hygiene  Community Resource Referral:  CRR required this visit?  No       Plan:  I have personally reviewed and addressed the Medicare Annual Wellness questionnaire and have noted the following in the patient's chart:  A. Medical and social history B. Use of alcohol, tobacco or illicit drugs  C. Current medications and supplements D. Functional ability and status E.  Nutritional status F.  Physical activity G. Advance directives H. List of other physicians I.  Hospitalizations, surgeries, and ER visits in previous 12 months J.  [place] such as hearing and vision if needed, cognitive  and depression L. Referrals and appointments - none  In addition, I have reviewed and discussed with patient certain preventive protocols, quality metrics, and best practice recommendations. A written personalized care plan for preventive services as well as general preventive health recommendations were provided to patient.  See attached scanned questionnaire for additional information.   Signed,  Fabio Neighbors, LPN Nurse Health Advisor   Nurse Recommendations: None.

## 2018-03-15 NOTE — Patient Instructions (Signed)
[name] , Thank you for taking time to come for your Medicare Wellness Visit. I appreciate your ongoing commitment to your health goals. Please review the following plan we discussed and let me know if I can assist you in the future.   Screening recommendations/referrals: Colonoscopy: No longer required.  Mammogram: No longer required.  Bone Density: Ordered today. Pt aware the office will call re: appt. Recommended yearly ophthalmology/optometry visit for glaucoma screening and checkup Recommended yearly dental visit for hygiene and checkup  Vaccinations: Influenza vaccine: Up to date Pneumococcal vaccine: Completed series Tdap vaccine: Up to date, due 02/2019 Shingles vaccine: Pt declines today.     Advanced directives: Advance directive discussed with you today. Even though you declined this today please call our office should you change your mind and we can give you the proper paperwork for you to fill out.  Conditions/risks identified: Obesity- recommend to avoid junk food and if wanting a snack to eat fruits and vegetables instead.  Next appointment: 04/05/18 with Dr Taylor Snyder.   Preventive Care 8 Years and Older, Female Preventive care refers to lifestyle choices and visits with your health care provider that can promote health and wellness. What does preventive care include?  A yearly physical exam. This is also called an annual well check.  Dental exams once or twice a year.  Routine eye exams. Ask your health care provider how often you should have your eyes checked.  Personal lifestyle choices, including:  Daily care of your teeth and gums.  Regular physical activity.  Eating a healthy diet.  Avoiding tobacco and drug use.  Limiting alcohol use.  Practicing safe sex.  Taking low-dose aspirin every day.  Taking vitamin and mineral supplements as recommended by your health care provider. What happens during an annual well check? The services and screenings  done by your health care provider during your annual well check will depend on your age, overall health, lifestyle risk factors, and family history of disease. Counseling  Your health care provider may ask you questions about your:  Alcohol use.  Tobacco use.  Drug use.  Emotional well-being.  Home and relationship well-being.  Sexual activity.  Eating habits.  History of falls.  Memory and ability to understand (cognition).  Work and work Statistician.  Reproductive health. Screening  You may have the following tests or measurements:  Height, weight, and BMI.  Blood pressure.  Lipid and cholesterol levels. These may be checked every 5 years, or more frequently if you are over 76 years old.  Skin check.  Lung cancer screening. You may have this screening every year starting at age 68 if you have a 30-pack-year history of smoking and currently smoke or have quit within the past 15 years.  Fecal occult blood test (FOBT) of the stool. You may have this test every year starting at age 90.  Flexible sigmoidoscopy or colonoscopy. You may have a sigmoidoscopy every 5 years or a colonoscopy every 10 years starting at age 63.  Hepatitis C blood test.  Hepatitis B blood test.  Sexually transmitted disease (STD) testing.  Diabetes screening. This is done by checking your blood sugar (glucose) after you have not eaten for a while (fasting). You may have this done every 1-3 years.  Bone density scan. This is done to screen for osteoporosis. You may have this done starting at age 66.  Mammogram. This may be done every 1-2 years. Talk to your health care provider about how often you should have  regular mammograms. Talk with your health care provider about your test results, treatment options, and if necessary, the need for more tests. Vaccines  Your health care provider may recommend certain vaccines, such as:  Influenza vaccine. This is recommended every year.  Tetanus,  diphtheria, and acellular pertussis (Tdap, Td) vaccine. You may need a Td booster every 10 years.  Zoster vaccine. You may need this after age 85.  Pneumococcal 13-valent conjugate (PCV13) vaccine. One dose is recommended after age 39.  Pneumococcal polysaccharide (PPSV23) vaccine. One dose is recommended after age 59. Talk to your health care provider about which screenings and vaccines you need and how often you need them. This information is not intended to replace advice given to you by your health care provider. Make sure you discuss any questions you have with your health care provider. Document Released: 03/01/2015 Document Revised: 10/23/2015 Document Reviewed: 12/04/2014 Elsevier Interactive Patient Education  2017 [place] Prevention in the Home Falls can cause injuries. They can happen to people of all ages. There are many things you can do to make your home safe and to help prevent falls. What can I do on the outside of my home?  Regularly fix the edges of walkways and driveways and fix any cracks.  Remove anything that might make you trip as you walk through a door, such as a raised step or threshold.  Trim any bushes or trees on the path to your home.  Use bright outdoor lighting.  Clear any walking paths of anything that might make someone trip, such as rocks or tools.  Regularly check to see if handrails are loose or broken. Make sure that both sides of any steps have handrails.  Any raised decks and porches should have guardrails on the edges.  Have any leaves, snow, or ice cleared regularly.  Use sand or salt on walking paths during winter.  Clean up any spills in your garage right away. This includes oil or grease spills. What can I do in the bathroom?  Use night lights.  Install grab bars by the toilet and in the tub and shower. Do not use towel bars as grab bars.  Use non-skid mats or decals in the tub or shower.  If you need to sit down in  the shower, use a plastic, non-slip stool.  Keep the floor dry. Clean up any water that spills on the floor as soon as it happens.  Remove soap buildup in the tub or shower regularly.  Attach bath mats securely with double-sided non-slip rug tape.  Do not have throw rugs and other things on the floor that can make you trip. What can I do in the bedroom?  Use night lights.  Make sure that you have a light by your bed that is easy to reach.  Do not use any sheets or blankets that are too big for your bed. They should not hang down onto the floor.  Have a firm chair that has side arms. You can use this for support while you get dressed.  Do not have throw rugs and other things on the floor that can make you trip. What can I do in the kitchen?  Clean up any spills right away.  Avoid walking on wet floors.  Keep items that you use a lot in easy-to-reach places.  If you need to reach something above you, use a strong step stool that has a grab bar.  Keep electrical cords out of the  way.  Do not use floor polish or wax that makes floors slippery. If you must use wax, use non-skid floor wax.  Do not have throw rugs and other things on the floor that can make you trip. What can I do with my stairs?  Do not leave any items on the stairs.  Make sure that there are handrails on both sides of the stairs and use them. Fix handrails that are broken or loose. Make sure that handrails are as long as the stairways.  Check any carpeting to make sure that it is firmly attached to the stairs. Fix any carpet that is loose or worn.  Avoid having throw rugs at the top or bottom of the stairs. If you do have throw rugs, attach them to the floor with carpet tape.  Make sure that you have a light switch at the top of the stairs and the bottom of the stairs. If you do not have them, ask someone to add them for you. What else can I do to help prevent falls?  Wear shoes that:  Do not have high  heels.  Have rubber bottoms.  Are comfortable and fit you well.  Are closed at the toe. Do not wear sandals.  If you use a stepladder:  Make sure that it is fully opened. Do not climb a closed stepladder.  Make sure that both sides of the stepladder are locked into place.  Ask someone to hold it for you, if possible.  Clearly mark and make sure that you can see:  Any grab bars or handrails.  First and last steps.  Where the edge of each step is.  Use tools that help you move around (mobility aids) if they are needed. These include:  Canes.  Walkers.  Scooters.  Crutches.  Turn on the lights when you go into a dark area. Replace any light bulbs as soon as they burn out.  Set up your furniture so you have a clear path. Avoid moving your furniture around.  If any of your floors are uneven, fix them.  If there are any pets around you, be aware of where they are.  Review your medicines with your doctor. Some medicines can make you feel dizzy. This can increase your chance of falling. Ask your doctor what other things that you can do to help prevent falls. This information is not intended to replace advice given to you by your health care provider. Make sure you discuss any questions you have with your health care provider. Document Released: 11/29/2008 Document Revised: 07/11/2015 Document Reviewed: 03/09/2014 Elsevier Interactive Patient Education  2017 [employer].

## 2018-03-17 ENCOUNTER — Other Ambulatory Visit: Payer: Self-pay

## 2018-03-17 MED ORDER — ACCU-CHEK GUIDE W/DEVICE KIT: 1.0000 | PACK | Freq: Every day | 12 refills | Status: DC

## 2018-03-17 MED ORDER — GLUCOSE BLOOD VI STRP: ORAL_STRIP | 12 refills | Status: AC

## 2018-03-17 NOTE — Telephone Encounter (Signed)
Needing to change Her testing supplies to: [employer] Line Needing test strip Rx as well.  Thanks, [employer]

## 2018-03-21 ENCOUNTER — Other Ambulatory Visit: Payer: Self-pay | Admitting: Family Medicine

## 2018-03-21 MED ORDER — RALOXIFENE HCL 60 MG PO TABS: 60.0000 mg | ORAL_TABLET | Freq: Every day | ORAL | 11 refills | Status: DC

## 2018-03-21 NOTE — Telephone Encounter (Signed)
[facility] faxed refill request for the following medications:  raloxifene (EVISTA) 60 MG tablet   Please advise.

## 2018-03-29 DIAGNOSIS — B351 Tinea unguium: Secondary | ICD-10-CM | POA: Diagnosis not present

## 2018-03-29 DIAGNOSIS — E1142 Type 2 diabetes mellitus with diabetic polyneuropathy: Secondary | ICD-10-CM | POA: Diagnosis not present

## 2018-04-05 ENCOUNTER — Other Ambulatory Visit: Payer: Self-pay

## 2018-04-05 ENCOUNTER — Encounter: Payer: Self-pay | Admitting: Family Medicine

## 2018-04-05 ENCOUNTER — Ambulatory Visit (INDEPENDENT_AMBULATORY_CARE_PROVIDER_SITE_OTHER): Payer: Medicare Other | Admitting: Family Medicine

## 2018-04-05 VITALS — BP 136/70 | HR 72 | Temp 98.6°F | Ht 63.0 in | Wt 168.2 lb

## 2018-04-05 DIAGNOSIS — I10 Essential (primary) hypertension: Secondary | ICD-10-CM | POA: Diagnosis not present

## 2018-04-05 DIAGNOSIS — E119 Type 2 diabetes mellitus without complications: Secondary | ICD-10-CM | POA: Diagnosis not present

## 2018-04-05 DIAGNOSIS — Z950 Presence of cardiac pacemaker: Secondary | ICD-10-CM | POA: Diagnosis not present

## 2018-04-05 DIAGNOSIS — I4811 Longstanding persistent atrial fibrillation: Secondary | ICD-10-CM | POA: Diagnosis not present

## 2018-04-05 DIAGNOSIS — E7800 Pure hypercholesterolemia, unspecified: Secondary | ICD-10-CM | POA: Diagnosis not present

## 2018-04-05 LAB — POCT GLYCOSYLATED HEMOGLOBIN (HGB A1C): Hemoglobin A1C: 7.7 % — AB (ref 4.0–5.6)

## 2018-04-05 NOTE — Progress Notes (Signed)
Patient: Taylor Snyder Female    DOB: 1928/08/22   85 y.o.   MRN: [id number] Visit Date: 04/05/2018  Today's Provider: [name], MD   Chief Complaint  Patient presents with  . Diabetes    4 month fup   Subjective:     HPI   Diabetes Mellitus Type II, Follow-up:   Lab Results  Component Value Date   HGBA1C 7.7 (A) 04/05/2018   HGBA1C 7.7 (A) 11/30/2017   HGBA1C 7.5 (H) 07/20/2017    Last seen for diabetes 4 months ago.  Management since then includes pt to work on diabetes habits. She reports good compliance with treatment. She is not having side effects.  Current symptoms include none and have been improving. Home blood sugar records: fasting range: 120's to 145  Episodes of hypoglycemia? no   Current Insulin Regimen: none Most Recent Eye Exam: appointment in April 2020 Weight trend: stable Prior visit with dietician: no Current diet: in general, a "healthy" diet   Current exercise: none   BP Readings from Last 3 Encounters:  04/05/18 136/70  03/15/18 (!) 126/52  11/30/17 126/72   Pt's Bp has been under control and she reports blood pressure doing good.  Pertinent Labs:    Component Value Date/Time   CHOL 163 07/20/2017 0914   TRIG 132 07/20/2017 0914   HDL 66 07/20/2017 0914   LDLCALC 71 07/20/2017 0914   CREATININE 0.67 07/20/2017 0914    Wt Readings from Last 3 Encounters:  04/05/18 168 lb 3.2 oz (76.3 kg)  03/15/18 169 lb 9.6 oz (76.9 kg)  11/30/17 170 lb (77.1 kg)    ------------------------------------------------------------------------   Allergies  Allergen Reactions  . Codeine Sulfate Other (See Comments)    Altered mental status  . Codeine Other (See Comments)    Other Reaction: insomnia Unable to sleep, hypes her up     Current Outpatient Medications:  .  acetaminophen (TYLENOL 8 HOUR) 650 MG CR tablet, Take 650 mg by mouth daily as needed for pain., Disp: , Rfl:  .  Blood Glucose Monitoring Suppl (ACCU-CHEK  GUIDE) w/Device KIT, 1 each by Does not apply route daily., Disp: 1 kit, Rfl: 12 .  Calcium Carbonate-Vitamin D 600-400 MG-UNIT per tablet, Take 1 tablet by mouth daily. , Disp: , Rfl:  .  cetirizine (ZYRTEC) 10 MG tablet, Take 10 mg by mouth at bedtime as needed for allergies. , Disp: , Rfl:  .  Cholecalciferol (VITAMIN D3) 2000 units TABS, Take by mouth., Disp: , Rfl:  .  enalapril (VASOTEC) 5 MG tablet, Take 1 tablet (5 mg total) by mouth daily., Disp: 90 tablet, Rfl: 3 .  Ferrous Fumarate (FERROCITE) 324 (106 Fe) MG TABS tablet, TK 1 T PO QAM, Disp: , Rfl:  .  ferrous sulfate 324 (65 Fe) MG TBEC, Take 1 tablet (325 mg total) by mouth every morning., Disp: 60 tablet, Rfl: 11 .  fluticasone (FLONASE) 50 MCG/ACT nasal spray, USE 2 SPRAYS IN EACH NOSTRIL ONCE DAILY (Patient taking differently: USE 1 SPRAYS IN EACH NOSTRIL ONCE DAILY AS NEEDED FOR ALLERGIES), Disp: 16 g, Rfl: 12 .  Glucosamine-Chondroitin (OSTEO BI-FLEX REGULAR STRENGTH PO), Take by mouth daily., Disp: , Rfl:  .  glucose blood (ACCU-CHEK GUIDE) test strip, Use as instructed, Disp: 100 each, Rfl: 12 .  glucose blood test strip, Use to check glucose daily and as needed, Disp: 100 each, Rfl: 11 .  Lancets Misc. (ACCU-CHEK SOFTCLIX LANCET DEV) KIT,  Dispense 1 kit, Disp: 1 kit, Rfl: 0 .  metFORMIN (GLUCOPHAGE) 500 MG tablet, TAKE 2 TABLETS BY MOUTH EVERY DAY, Disp: 180 tablet, Rfl: 0 .  Multiple Vitamins-Minerals (CENTRUM SILVER) tablet, Take 1 tablet by mouth daily. , Disp: , Rfl:  .  omeprazole (PRILOSEC) 20 MG capsule, TAKE 1 CAPSULE(20 MG) BY MOUTH DAILY, Disp: 90 capsule, Rfl: 0 .  pravastatin (PRAVACHOL) 40 MG tablet, Take 1 tablet (40 mg total) by mouth at bedtime., Disp: 90 tablet, Rfl: 3 .  raloxifene (EVISTA) 60 MG tablet, Take 1 tablet (60 mg total) by mouth daily., Disp: 30 tablet, Rfl: 11 .  WIXELA INHUB 100-50 MCG/DOSE AEPB, INHALE 1 PUFF INTO THE LUNGS TWICE DAILY, Disp: 180 each, Rfl: 5 .  XARELTO 20 MG TABS tablet, TAKE  1 TABLET BY MOUTH EVERY DAY, Disp: 90 tablet, Rfl: 3  Review of Systems  Constitutional: Negative.   HENT: Negative.   Eyes: Negative.   Respiratory: Negative.   Cardiovascular: Negative.   Gastrointestinal: Negative.   Endocrine: Positive for cold intolerance.       Cold Since being on Xarelto.  Allergic/Immunologic: Negative.   Neurological: Negative.   Hematological: Negative.   Psychiatric/Behavioral: Negative.   All other systems reviewed and are negative.   Social History   Tobacco Use  . Smoking status: Never Smoker  . Smokeless tobacco: Never Used  Substance Use Topics  . Alcohol use: No      Objective:   BP 136/70 (BP Location: Right Arm, Patient Position: Sitting, Cuff Size: Normal)   Pulse 72   Temp 98.6 F (37 C) (Oral)   Ht '5\' 3"'  (1.6 m)   Wt 168 lb 3.2 oz (76.3 kg)   SpO2 99%   BMI 29.80 kg/m  Vitals:   04/05/18 0857  BP: 136/70  Pulse: 72  Temp: 98.6 F (37 C)  TempSrc: Oral  SpO2: 99%  Weight: 168 lb 3.2 oz (76.3 kg)  Height: '5\' 3"'  (1.6 m)     Physical Exam Constitutional:      Appearance: Normal appearance. She is well-developed and normal weight.  HENT:     Head: Normocephalic and atraumatic.     Right Ear: External ear normal.     Left Ear: External ear normal.     Nose: Nose normal.     Mouth/Throat:     Pharynx: Oropharynx is clear.  Eyes:     General: No scleral icterus.    Conjunctiva/sclera: Conjunctivae normal.  Neck:     Thyroid: No thyromegaly.     Vascular: No carotid bruit.  Cardiovascular:     Rate and Rhythm: Normal rate and regular rhythm.     Heart sounds: Normal heart sounds.  Pulmonary:     Effort: Pulmonary effort is normal.     Breath sounds: Normal breath sounds.  Abdominal:     Palpations: Abdomen is soft.  Musculoskeletal:     Comments: 1+ LE edema.  Skin:    General: Skin is warm and dry.  Neurological:     General: No focal deficit present.     Mental Status: She is alert and oriented to person,  place, and time. Mental status is at baseline.  Psychiatric:        Mood and Affect: Mood normal.        Behavior: Behavior normal.        Thought Content: Thought content normal.        Judgment: Judgment normal.  Assessment & Plan    1. Type 2 diabetes mellitus without complication, without long-term current use of insulin ([place]) Fair control.  Patient to work on her lifestyle, eating and exercise.  Return in 4 months. - POCT glycosylated hemoglobin (Hb A1C)--7.7  2. Benign essential HTN Good control.  3. Longstanding persistent atrial fibrillation On Xarelto.  4. Pure hypercholesterolemia On pravastatin.  5. Pacemaker Followed by cardiology.    I have done the exam and reviewed the above chart and it is accurate to the best of my knowledge. Development worker, community has been used in this note in any air is in the dictation or transcription are unintentional.  [name], MD  [place]

## 2018-05-05 ENCOUNTER — Other Ambulatory Visit: Payer: No Typology Code available for payment source

## 2018-05-18 ENCOUNTER — Other Ambulatory Visit: Payer: Self-pay | Admitting: Family Medicine

## 2018-05-31 ENCOUNTER — Other Ambulatory Visit: Payer: Self-pay | Admitting: Family Medicine

## 2018-05-31 DIAGNOSIS — M1990 Unspecified osteoarthritis, unspecified site: Secondary | ICD-10-CM

## 2018-06-13 ENCOUNTER — Other Ambulatory Visit: Payer: No Typology Code available for payment source

## 2018-07-18 ENCOUNTER — Other Ambulatory Visit: Payer: Self-pay | Admitting: Family Medicine

## 2018-07-18 DIAGNOSIS — E7800 Pure hypercholesterolemia, unspecified: Secondary | ICD-10-CM

## 2018-07-18 MED ORDER — PRAVASTATIN SODIUM 40 MG PO TABS: 40.0000 mg | ORAL_TABLET | Freq: Every day | ORAL | 3 refills | Status: DC

## 2018-07-18 NOTE — Telephone Encounter (Signed)
Please review. Thanks!  

## 2018-07-18 NOTE — Telephone Encounter (Signed)
[facility] faxed refill request for the following medications:  pravastatin (PRAVACHOL) 40 MG tablet   Please advise.  

## 2018-07-19 ENCOUNTER — Other Ambulatory Visit: Payer: Self-pay

## 2018-07-19 NOTE — Telephone Encounter (Signed)
Please review

## 2018-07-22 MED ORDER — ACCU-CHEK SOFTCLIX LANCET DEV KIT: PACK | 11 refills | Status: DC

## 2018-07-22 MED ORDER — GLUCOSE BLOOD VI STRP: ORAL_STRIP | 11 refills | Status: AC

## 2018-07-22 MED ORDER — BLOOD GLUCOSE METER KIT: PACK | 0 refills | Status: DC

## 2018-08-03 ENCOUNTER — Other Ambulatory Visit: Payer: Self-pay

## 2018-08-03 ENCOUNTER — Ambulatory Visit
Admission: RE | Admit: 2018-08-03 | Discharge: 2018-08-03 | Disposition: A | Discharge status: Home / Self Care | Payer: Medicare Other | Source: Ambulatory Visit | Attending: Family Medicine | Admitting: Family Medicine

## 2018-08-03 DIAGNOSIS — Z780 Asymptomatic menopausal state: Secondary | ICD-10-CM | POA: Diagnosis not present

## 2018-08-03 DIAGNOSIS — M810 Age-related osteoporosis without current pathological fracture: Secondary | ICD-10-CM

## 2018-08-03 DIAGNOSIS — M85851 Other specified disorders of bone density and structure, right thigh: Secondary | ICD-10-CM | POA: Diagnosis not present

## 2018-08-04 ENCOUNTER — Ambulatory Visit (INDEPENDENT_AMBULATORY_CARE_PROVIDER_SITE_OTHER): Payer: Medicare Other | Admitting: Family Medicine

## 2018-08-04 ENCOUNTER — Other Ambulatory Visit: Payer: Self-pay

## 2018-08-04 ENCOUNTER — Encounter: Payer: Self-pay | Admitting: Family Medicine

## 2018-08-04 VITALS — BP 124/74 | HR 74 | Temp 98.3°F | Resp 16 | Ht 63.0 in | Wt 162.0 lb

## 2018-08-04 DIAGNOSIS — I4811 Longstanding persistent atrial fibrillation: Secondary | ICD-10-CM | POA: Diagnosis not present

## 2018-08-04 DIAGNOSIS — E7800 Pure hypercholesterolemia, unspecified: Secondary | ICD-10-CM | POA: Diagnosis not present

## 2018-08-04 DIAGNOSIS — M791 Myalgia, unspecified site: Secondary | ICD-10-CM

## 2018-08-04 DIAGNOSIS — M810 Age-related osteoporosis without current pathological fracture: Secondary | ICD-10-CM

## 2018-08-04 DIAGNOSIS — E119 Type 2 diabetes mellitus without complications: Secondary | ICD-10-CM | POA: Diagnosis not present

## 2018-08-04 DIAGNOSIS — I10 Essential (primary) hypertension: Secondary | ICD-10-CM

## 2018-08-04 LAB — POCT GLYCOSYLATED HEMOGLOBIN (HGB A1C)
Est. average glucose Bld gHb Est-mCnc: 157
Hemoglobin A1C: 7.1 % — AB (ref 4.0–5.6)

## 2018-08-04 NOTE — Progress Notes (Signed)
Patient: Taylor Snyder Female    DOB: October 23, 1928   85 y.o.   MRN: [id number] Visit Date: 08/04/2018  Today's Provider: [name], MD   Chief Complaint  Patient presents with  . Diabetes  . Hypertension  . Hyperlipidemia   Subjective:   HPI  Diabetes Mellitus Type II, Follow-up:   Lab Results  Component Value Date   HGBA1C 7.1 (A) 08/04/2018   HGBA1C 7.7 (A) 04/05/2018   HGBA1C 7.7 (A) 11/30/2017    Last seen for diabetes 4 months ago.  Management since then includes no changes. She reports good compliance with treatment. She is not having side effects.  Current symptoms include none and have been stable. Home blood sugar records: fasting range: 130s-150s  Episodes of hypoglycemia? no   Current Insulin Regimen: none Most Recent Eye Exam: up to date Weight trend: stable Prior visit with dietician: No Current exercise: no regular exercise Current diet habits: well balanced  Pertinent Labs:    Component Value Date/Time   CHOL 163 07/20/2017 0914   TRIG 132 07/20/2017 0914   HDL 66 07/20/2017 0914   LDLCALC 71 07/20/2017 0914   CREATININE 0.67 07/20/2017 0914    Wt Readings from Last 3 Encounters:  08/04/18 162 lb (73.5 kg)  04/05/18 168 lb 3.2 oz (76.3 kg)  03/15/18 169 lb 9.6 oz (76.9 kg)    Hypertension, follow-up:  BP Readings from Last 3 Encounters:  08/04/18 124/74  04/05/18 136/70  03/15/18 (!) 126/52    She was last seen for hypertension 4 months ago.  BP at that visit was 136/70. Management since that visit includes no changes. She reports good compliance with treatment. She is not having side effects.  She is not exercising. She is adherent to low salt diet.   Outside blood pressures are checked occasionally. She is experiencing none.  Patient denies exertional chest pressure/discomfort and lower extremity edema.     Lipid/Cholesterol, Follow-up:   Last seen for this4 months ago.  Management changes since that visit  include no changes. . Last Lipid Panel:    Component Value Date/Time   CHOL 163 07/20/2017 0914   TRIG 132 07/20/2017 0914   HDL 66 07/20/2017 0914   CHOLHDL 2.5 07/20/2017 0914   LDLCALC 71 07/20/2017 0914    Risk factors for vascular disease include diabetes mellitus and hypertension  She reports good compliance with treatment. She is not having side effects.  Current symptoms include none and have been stable.  Allergies  Allergen Reactions  . Codeine Sulfate Other (See Comments)    Altered mental status  . Codeine Other (See Comments)    Other Reaction: insomnia Unable to sleep, hypes her up     Current Outpatient Medications:  .  acetaminophen (TYLENOL 8 HOUR) 650 MG CR tablet, Take 650 mg by mouth daily as needed for pain., Disp: , Rfl:  .  blood glucose meter kit and supplies, Dispense based on patient and insurance preference. Use up to four times daily as directed. (FOR ICD-10 E10.9, E11.9)., Disp: 1 each, Rfl: 0 .  Blood Glucose Monitoring Suppl (ACCU-CHEK GUIDE) w/Device KIT, 1 each by Does not apply route daily., Disp: 1 kit, Rfl: 12 .  Calcium Carbonate-Vitamin D 600-400 MG-UNIT per tablet, Take 1 tablet by mouth daily. , Disp: , Rfl:  .  cetirizine (ZYRTEC) 10 MG tablet, Take 10 mg by mouth at bedtime as needed for allergies. , Disp: , Rfl:  .  Cholecalciferol (VITAMIN D3) 2000 units TABS, Take by mouth., Disp: , Rfl:  .  enalapril (VASOTEC) 5 MG tablet, Take 1 tablet (5 mg total) by mouth daily., Disp: 90 tablet, Rfl: 3 .  Ferrous Fumarate (FERROCITE) 324 (106 Fe) MG TABS tablet, TK 1 T PO QAM, Disp: , Rfl:  .  ferrous sulfate 324 (65 Fe) MG TBEC, Take 1 tablet (325 mg total) by mouth every morning., Disp: 60 tablet, Rfl: 11 .  fluticasone (FLONASE) 50 MCG/ACT nasal spray, USE 2 SPRAYS IN EACH NOSTRIL ONCE DAILY (Patient taking differently: USE 1 SPRAYS IN EACH NOSTRIL ONCE DAILY AS NEEDED FOR ALLERGIES), Disp: 16 g, Rfl: 12 .  Glucosamine-Chondroitin (OSTEO  BI-FLEX REGULAR STRENGTH PO), Take by mouth daily., Disp: , Rfl:  .  glucose blood (ACCU-CHEK GUIDE) test strip, Use as instructed, Disp: 100 each, Rfl: 12 .  glucose blood test strip, Use to check glucose daily and as needed, Disp: 100 each, Rfl: 11 .  Lancets Misc. (ACCU-CHEK SOFTCLIX LANCET DEV) KIT, Dispense 1 kit, Disp: 1 kit, Rfl: 11 .  metFORMIN (GLUCOPHAGE) 500 MG tablet, TAKE 2 TABLETS BY MOUTH EVERY DAY, Disp: 180 tablet, Rfl: 3 .  Multiple Vitamins-Minerals (CENTRUM SILVER) tablet, Take 1 tablet by mouth daily. , Disp: , Rfl:  .  omeprazole (PRILOSEC) 20 MG capsule, TAKE 1 CAPSULE(20 MG) BY MOUTH DAILY, Disp: 90 capsule, Rfl: 0 .  pravastatin (PRAVACHOL) 40 MG tablet, Take 1 tablet (40 mg total) by mouth at bedtime., Disp: 90 tablet, Rfl: 3 .  raloxifene (EVISTA) 60 MG tablet, Take 1 tablet (60 mg total) by mouth daily., Disp: 30 tablet, Rfl: 11 .  WIXELA INHUB 100-50 MCG/DOSE AEPB, INHALE 1 PUFF INTO THE LUNGS TWICE DAILY, Disp: 180 each, Rfl: 5 .  XARELTO 20 MG TABS tablet, TAKE 1 TABLET BY MOUTH EVERY DAY, Disp: 90 tablet, Rfl: 3  Review of Systems  Constitutional: Negative for activity change, appetite change, chills, diaphoresis, fatigue, fever and unexpected weight change.  Eyes: Negative.   Respiratory: Negative for cough and shortness of breath.   Cardiovascular: Negative for chest pain, palpitations and leg swelling.  Gastrointestinal: Negative.   Endocrine: Negative for cold intolerance, heat intolerance, polydipsia, polyphagia and polyuria.  Musculoskeletal: Positive for arthralgias and myalgias. Negative for gait problem and joint swelling.  Allergic/Immunologic: Negative.   Neurological: Negative for dizziness, light-headedness and numbness.  Psychiatric/Behavioral: Negative.  Negative for agitation, self-injury, sleep disturbance and suicidal ideas. The patient is not nervous/anxious.     Social History   Tobacco Use  . Smoking status: Never Smoker  . Smokeless  tobacco: Never Used  Substance Use Topics  . Alcohol use: No      Objective:   BP 124/74 (BP Location: Right Arm, Patient Position: Sitting, Cuff Size: Normal)   Pulse 74 Comment: irregular  Temp 98.3 F (36.8 C)   Resp 16   Ht 5' 3" (1.6 m)   Wt 162 lb (73.5 kg)   SpO2 97%   BMI 28.70 kg/m  Vitals:   08/04/18 0923  BP: 124/74  Pulse: 74  Resp: 16  Temp: 98.3 F (36.8 C)  SpO2: 97%  Weight: 162 lb (73.5 kg)  Height: 5' 3" (1.6 m)     Physical Exam Vitals signs reviewed.  Constitutional:      Appearance: Normal appearance. She is well-developed and normal weight.  HENT:     Head: Normocephalic and atraumatic.     Right Ear: External ear normal.       Left Ear: External ear normal.     Nose: Nose normal.     Mouth/Throat:     Pharynx: Oropharynx is clear.  Eyes:     General: No scleral icterus.    Conjunctiva/sclera: Conjunctivae normal.  Neck:     Thyroid: No thyromegaly.     Vascular: No carotid bruit.  Cardiovascular:     Rate and Rhythm: Normal rate and regular rhythm.     Heart sounds: Normal heart sounds.  Pulmonary:     Effort: Pulmonary effort is normal.     Breath sounds: Normal breath sounds.  Abdominal:     Palpations: Abdomen is soft.  Musculoskeletal:     Comments: 1+ LE edema.  Skin:    General: Skin is warm and dry.  Neurological:     General: No focal deficit present.     Mental Status: She is alert and oriented to person, place, and time. Mental status is at baseline.  Psychiatric:        Mood and Affect: Mood normal.        Behavior: Behavior normal.        Thought Content: Thought content normal.        Judgment: Judgment normal.         Assessment & Plan    1. Type 2 diabetes mellitus without complication, without long-term current use of insulin (HCC) Good control - POCT glycosylated hemoglobin (Hb A1C)7.1 today  2. Benign essential HTN On Enalapril. - Comprehensive metabolic panel  3. Pure hypercholesterolemia On  pravachol. - TSH - Lipid panel  4. Longstanding persistent atrial fibrillation   5. Myalgia Off of Mobic and Evista. - CBC with Differential/Platelet - CK 6.Osteoporosis Of forearm only . Will consider other options.More than 50% 25 minute visit spent in counseling or coordination of care Off of Evista.   I have done the exam and reviewed the above chart and it is accurate to the best of my knowledge. Development worker, community has been used in this note in any air is in the dictation or transcription are unintentional.  [name], MD  [facility]

## 2018-08-04 NOTE — Patient Instructions (Signed)
Try over the counter Magnesium Oxide twice daily for muscle cramps.

## 2018-08-05 LAB — COMPREHENSIVE METABOLIC PANEL
ALT: 15 IU/L (ref 0–32)
AST: 19 IU/L (ref 0–40)
Albumin/Globulin Ratio: 2.6 — ABNORMAL HIGH (ref 1.2–2.2)
Albumin: 4.6 g/dL (ref 3.6–4.6)
Alkaline Phosphatase: 75 IU/L (ref 39–117)
BUN/Creatinine Ratio: 15 (ref 12–28)
BUN: 11 mg/dL (ref 8–27)
Bilirubin Total: 0.4 mg/dL (ref 0.0–1.2)
CO2: 24 mmol/L (ref 20–29)
Calcium: 9.5 mg/dL (ref 8.7–10.3)
Chloride: 95 mmol/L — ABNORMAL LOW (ref 96–106)
Creatinine, Ser: 0.74 mg/dL (ref 0.57–1.00)
GFR calc Af Amer: 84 mL/min/{1.73_m2} (ref >59)
GFR calc non Af Amer: 73 mL/min/{1.73_m2} (ref >59)
Globulin, Total: 1.8 g/dL (ref 1.5–4.5)
Glucose: 144 mg/dL — ABNORMAL HIGH (ref 65–99)
Potassium: 3.9 mmol/L (ref 3.5–5.2)
Sodium: 137 mmol/L (ref 134–144)
Total Protein: 6.4 g/dL (ref 6.0–8.5)

## 2018-08-05 LAB — CBC WITH DIFFERENTIAL/PLATELET
Basophils Absolute: 0 10*3/uL (ref 0.0–0.2)
Basos: 0 %
EOS (ABSOLUTE): 0.1 10*3/uL (ref 0.0–0.4)
Eos: 1 %
Hematocrit: 40.7 % (ref 34.0–46.6)
Hemoglobin: 13.9 g/dL (ref 11.1–15.9)
Immature Grans (Abs): 0 10*3/uL (ref 0.0–0.1)
Immature Granulocytes: 0 %
Lymphocytes Absolute: 2.6 10*3/uL (ref 0.7–3.1)
Lymphs: 37 %
MCH: 31.8 pg (ref 26.6–33.0)
MCHC: 34.2 g/dL (ref 31.5–35.7)
MCV: 93 fL (ref 79–97)
Monocytes Absolute: 0.7 10*3/uL (ref 0.1–0.9)
Monocytes: 10 %
Neutrophils Absolute: 3.6 10*3/uL (ref 1.4–7.0)
Neutrophils: 52 %
Platelets: 211 10*3/uL (ref 150–450)
RBC: 4.37 x10E6/uL (ref 3.77–5.28)
RDW: 13.3 % (ref 11.7–15.4)
WBC: 7.1 10*3/uL (ref 3.4–10.8)

## 2018-08-05 LAB — LIPID PANEL
Chol/HDL Ratio: 2.2 ratio (ref 0.0–4.4)
Cholesterol, Total: 158 mg/dL (ref 100–199)
HDL: 71 mg/dL (ref >39)
LDL Calculated: 66 mg/dL (ref 0–99)
Triglycerides: 107 mg/dL (ref 0–149)
VLDL Cholesterol Cal: 21 mg/dL (ref 5–40)

## 2018-08-05 LAB — CK: Total CK: 57 U/L (ref 26–161)

## 2018-08-05 LAB — TSH: TSH: 2.76 u[IU]/mL (ref 0.450–4.500)

## 2018-08-08 ENCOUNTER — Telehealth: Payer: Self-pay

## 2018-08-08 NOTE — Telephone Encounter (Signed)
Patient was advised.  

## 2018-08-08 NOTE — Telephone Encounter (Signed)
-----   Message from Jerrol Banana., MD sent at 08/05/2018  9:03 AM EDT ----- Stable.

## 2018-08-09 DIAGNOSIS — E1142 Type 2 diabetes mellitus with diabetic polyneuropathy: Secondary | ICD-10-CM | POA: Diagnosis not present

## 2018-08-09 DIAGNOSIS — B351 Tinea unguium: Secondary | ICD-10-CM | POA: Diagnosis not present

## 2018-08-23 ENCOUNTER — Telehealth: Payer: Self-pay

## 2018-08-23 NOTE — Telephone Encounter (Signed)
-----   Message from Jerrol Banana., MD sent at 08/23/2018 11:16 AM EDT ----- BMD ok

## 2018-08-23 NOTE — Telephone Encounter (Signed)
Patient notified of lab results

## 2018-08-26 ENCOUNTER — Other Ambulatory Visit: Payer: Self-pay | Admitting: Family Medicine

## 2018-08-26 DIAGNOSIS — M1990 Unspecified osteoarthritis, unspecified site: Secondary | ICD-10-CM

## 2018-09-12 ENCOUNTER — Telehealth: Payer: Self-pay | Admitting: Family Medicine

## 2018-09-12 NOTE — Telephone Encounter (Signed)
Pecola Lawless with [employer] stated they faxed an order last week for pain relieving brace and is requesting a status update. Please advise. Thanks TNP

## 2018-09-13 NOTE — Telephone Encounter (Signed)
I did not order this.

## 2018-09-13 NOTE — Telephone Encounter (Signed)
Dr. Rosanna Randy have you seen this form? KW

## 2018-09-14 NOTE — Telephone Encounter (Signed)
Company advised

## 2018-09-15 ENCOUNTER — Other Ambulatory Visit: Payer: Self-pay | Admitting: Family Medicine

## 2018-10-05 DIAGNOSIS — E119 Type 2 diabetes mellitus without complications: Secondary | ICD-10-CM | POA: Diagnosis not present

## 2018-10-05 LAB — HM DIABETES EYE EXAM

## 2018-11-08 ENCOUNTER — Telehealth: Payer: Self-pay | Admitting: Family Medicine

## 2018-11-08 NOTE — Telephone Encounter (Signed)
Please review

## 2018-11-08 NOTE — Telephone Encounter (Signed)
[facility] -will be sending a fax for pt.  Needing Dr. Rosanna Randy to review, sign and fax back.  Thanks, [employer]

## 2018-11-08 NOTE — Telephone Encounter (Signed)
This has to be part of face to face on next visit

## 2018-11-09 DIAGNOSIS — E1142 Type 2 diabetes mellitus with diabetic polyneuropathy: Secondary | ICD-10-CM | POA: Diagnosis not present

## 2018-11-09 DIAGNOSIS — L851 Acquired keratosis [keratoderma] palmaris et plantaris: Secondary | ICD-10-CM | POA: Diagnosis not present

## 2018-11-09 DIAGNOSIS — B351 Tinea unguium: Secondary | ICD-10-CM | POA: Diagnosis not present

## 2018-11-24 DIAGNOSIS — D2262 Melanocytic nevi of left upper limb, including shoulder: Secondary | ICD-10-CM | POA: Diagnosis not present

## 2018-11-24 DIAGNOSIS — L821 Other seborrheic keratosis: Secondary | ICD-10-CM | POA: Diagnosis not present

## 2018-11-24 DIAGNOSIS — L565 Disseminated superficial actinic porokeratosis (DSAP): Secondary | ICD-10-CM | POA: Diagnosis not present

## 2018-11-24 DIAGNOSIS — D225 Melanocytic nevi of trunk: Secondary | ICD-10-CM | POA: Diagnosis not present

## 2018-11-24 DIAGNOSIS — D2261 Melanocytic nevi of right upper limb, including shoulder: Secondary | ICD-10-CM | POA: Diagnosis not present

## 2018-11-24 DIAGNOSIS — D2271 Melanocytic nevi of right lower limb, including hip: Secondary | ICD-10-CM | POA: Diagnosis not present

## 2018-11-24 DIAGNOSIS — X32XXXA Exposure to sunlight, initial encounter: Secondary | ICD-10-CM | POA: Diagnosis not present

## 2018-11-24 DIAGNOSIS — L570 Actinic keratosis: Secondary | ICD-10-CM | POA: Diagnosis not present

## 2018-11-24 DIAGNOSIS — D2272 Melanocytic nevi of left lower limb, including hip: Secondary | ICD-10-CM | POA: Diagnosis not present

## 2018-11-25 ENCOUNTER — Other Ambulatory Visit: Payer: Self-pay | Admitting: Family Medicine

## 2018-12-20 DIAGNOSIS — I480 Paroxysmal atrial fibrillation: Secondary | ICD-10-CM | POA: Diagnosis not present

## 2018-12-20 DIAGNOSIS — E782 Mixed hyperlipidemia: Secondary | ICD-10-CM | POA: Diagnosis not present

## 2018-12-20 DIAGNOSIS — I495 Sick sinus syndrome: Secondary | ICD-10-CM | POA: Diagnosis not present

## 2018-12-20 DIAGNOSIS — I10 Essential (primary) hypertension: Secondary | ICD-10-CM | POA: Diagnosis not present

## 2018-12-20 DIAGNOSIS — I340 Nonrheumatic mitral (valve) insufficiency: Secondary | ICD-10-CM | POA: Diagnosis not present

## 2019-01-04 NOTE — Progress Notes (Signed)
Patient: Taylor Snyder Female    DOB: [birth year]   85 y.o.   MRN: [id number] Visit Date: 01/05/2019  Today's Provider: [name], MD   Chief Complaint  Patient presents with  . Follow-up  . Diabetes  . Hypertension   Subjective:     The patient is being seen for a 5 Month Follow up.    Type 2 diabetes mellitus without complication, without long-term current use of insulin ([place]) From 08/04/2018-Good control. Hb A1C 7.1. Sugar fasting 124 and nighttime is 164.  Benign essential HTN From 08/04/2018-On Enalapril. Labs stable.   Pure hypercholesterolemia From 08/04/2018-On pravachol. Labs stable.   Longstanding persistent atrial fibrillation From 08/04/2018-Labs stable.   Myalgia From 08/04/2018-Off of Mobic and Evista.  Osteoporosis From 08/04/2018-Of forearm only . Will consider other options. More than 50% 25 minute visit spent in counseling or coordination of care Off of Evista.  Allergies  Allergen Reactions  . Codeine Sulfate Other (See Comments)    Altered mental status  . Codeine Other (See Comments)    Other Reaction: insomnia Unable to sleep, hypes her up     Current Outpatient Medications:  .  acetaminophen (TYLENOL 8 HOUR) 650 MG CR tablet, Take 650 mg by mouth daily as needed for pain., Disp: , Rfl:  .  blood glucose meter kit and supplies, Dispense based on patient and insurance preference. Use up to four times daily as directed. (FOR ICD-10 E10.9, E11.9)., Disp: 1 each, Rfl: 0 .  Blood Glucose Monitoring Suppl (ACCU-CHEK GUIDE) w/Device KIT, 1 each by Does not apply route daily., Disp: 1 kit, Rfl: 12 .  Calcium Carbonate-Vitamin D 600-400 MG-UNIT per tablet, Take 1 tablet by mouth daily. , Disp: , Rfl:  .  cetirizine (ZYRTEC) 10 MG tablet, Take 10 mg by mouth at bedtime as needed for allergies. , Disp: , Rfl:  .  Cholecalciferol (VITAMIN D3) 2000 units TABS, Take by mouth., Disp: , Rfl:  .  enalapril (VASOTEC) 5 MG tablet, Take 1 tablet  (5 mg total) by mouth daily., Disp: 90 tablet, Rfl: 3 .  FERROCITE 324 MG TABS tablet, TAKE 1 TABLET BY MOUTH EVERY MORNING, Disp: 60 tablet, Rfl: 11 .  Ferrous Fumarate (FERROCITE) 324 (106 Fe) MG TABS tablet, TK 1 T PO QAM, Disp: , Rfl:  .  fluticasone (FLONASE) 50 MCG/ACT nasal spray, USE 2 SPRAYS IN EACH NOSTRIL ONCE DAILY (Patient taking differently: USE 1 SPRAYS IN EACH NOSTRIL ONCE DAILY AS NEEDED FOR ALLERGIES), Disp: 16 g, Rfl: 12 .  Glucosamine-Chondroitin (OSTEO BI-FLEX REGULAR STRENGTH PO), Take by mouth daily., Disp: , Rfl:  .  glucose blood (ACCU-CHEK GUIDE) test strip, Use as instructed, Disp: 100 each, Rfl: 12 .  glucose blood test strip, Use to check glucose daily and as needed, Disp: 100 each, Rfl: 11 .  Lancets Misc. (ACCU-CHEK SOFTCLIX LANCET DEV) KIT, Dispense 1 kit, Disp: 1 kit, Rfl: 11 .  metFORMIN (GLUCOPHAGE) 500 MG tablet, TAKE 2 TABLETS BY MOUTH EVERY DAY, Disp: 180 tablet, Rfl: 3 .  Multiple Vitamins-Minerals (CENTRUM SILVER) tablet, Take 1 tablet by mouth daily. , Disp: , Rfl:  .  omeprazole (PRILOSEC) 20 MG capsule, TAKE 1 CAPSULE(20 MG) BY MOUTH DAILY, Disp: 90 capsule, Rfl: 1 .  pravastatin (PRAVACHOL) 40 MG tablet, Take 1 tablet (40 mg total) by mouth at bedtime., Disp: 90 tablet, Rfl: 3 .  raloxifene (EVISTA) 60 MG tablet, Take 1 tablet (60 mg total) by mouth daily.,  Disp: 30 tablet, Rfl: 11 .  WIXELA INHUB 100-50 MCG/DOSE AEPB, INHALE 1 PUFF INTO THE LUNGS TWICE DAILY, Disp: 180 each, Rfl: 5 .  XARELTO 20 MG TABS tablet, TAKE 1 TABLET BY MOUTH EVERY DAY, Disp: 90 tablet, Rfl: 3  Review of Systems  Constitutional: Negative for appetite change, chills, fatigue and fever.  HENT: Negative.   Eyes: Negative.   Respiratory: Negative for chest tightness and shortness of breath.   Cardiovascular: Negative for chest pain and palpitations.  Gastrointestinal: Negative.  Negative for abdominal pain, nausea and vomiting.  Endocrine: Negative.   Allergic/Immunologic:  Negative.   Neurological: Negative for dizziness and weakness.  Hematological: Negative.   Psychiatric/Behavioral: Negative.     Social History   Tobacco Use  . Smoking status: Never Smoker  . Smokeless tobacco: Never Used  Substance Use Topics  . Alcohol use: No      Objective:   BP 137/72   Pulse 74   Temp (!) 97.5 F (36.4 C) (Temporal)   Resp 16   Ht '5\' 3"'$  (1.6 m)   Wt 167 lb 6.4 oz (75.9 kg)   BMI 29.65 kg/m  Vitals:   01/05/19 1001  BP: 137/72  Pulse: 74  Resp: 16  Temp: (!) 97.5 F (36.4 C)  TempSrc: Temporal  Weight: 167 lb 6.4 oz (75.9 kg)  Height: '5\' 3"'$  (1.6 m)  Body mass index is 29.65 kg/m.   Physical Exam Vitals signs reviewed.  Constitutional:      Appearance: Normal appearance. She is well-developed and normal weight.  HENT:     Head: Normocephalic and atraumatic.     Right Ear: External ear normal.     Left Ear: External ear normal.     Nose: Nose normal.     Mouth/Throat:     Pharynx: Oropharynx is clear.  Eyes:     General: No scleral icterus.    Conjunctiva/sclera: Conjunctivae normal.  Neck:     Thyroid: No thyromegaly.     Vascular: No carotid bruit.  Cardiovascular:     Rate and Rhythm: Normal rate and regular rhythm.     Heart sounds: Normal heart sounds.  Pulmonary:     Effort: Pulmonary effort is normal.     Breath sounds: Normal breath sounds.  Abdominal:     Palpations: Abdomen is soft.  Musculoskeletal:     Comments: 1+ LE edema.  Skin:    General: Skin is warm and dry.  Neurological:     General: No focal deficit present.     Mental Status: She is alert and oriented to person, place, and time. Mental status is at baseline.  Psychiatric:        Mood and Affect: Mood normal.        Behavior: Behavior normal.        Thought Content: Thought content normal.        Judgment: Judgment normal.      Results for orders placed or performed in visit on 01/05/19  POCT HgB A1C  Result Value Ref Range   Hemoglobin A1C  7.1 (A) 4.0 - 5.6 %   Est. average glucose Bld gHb Est-mCnc 157        Assessment & Plan    1. Type 2 diabetes mellitus without complication, without long-term current use of insulin (HCC) 7.1 today. - POCT HgB A1C  2. Need for influenza vaccination  - Flu Vaccine QUAD High Dose(Fluad)  3. Benign essential HTN   4. Osteoporosis, unspecified  osteoporosis type, unspecified pathological fracture presence      [name], MD  [facility]

## 2019-01-05 ENCOUNTER — Encounter: Payer: Self-pay | Admitting: Family Medicine

## 2019-01-05 ENCOUNTER — Other Ambulatory Visit: Payer: Self-pay

## 2019-01-05 ENCOUNTER — Ambulatory Visit (INDEPENDENT_AMBULATORY_CARE_PROVIDER_SITE_OTHER): Payer: Medicare Other | Admitting: Family Medicine

## 2019-01-05 VITALS — BP 137/72 | HR 74 | Temp 97.5°F | Resp 16 | Ht 63.0 in | Wt 167.4 lb

## 2019-01-05 DIAGNOSIS — E119 Type 2 diabetes mellitus without complications: Secondary | ICD-10-CM | POA: Diagnosis not present

## 2019-01-05 DIAGNOSIS — Z23 Encounter for immunization: Secondary | ICD-10-CM

## 2019-01-05 DIAGNOSIS — M810 Age-related osteoporosis without current pathological fracture: Secondary | ICD-10-CM | POA: Diagnosis not present

## 2019-01-05 DIAGNOSIS — I10 Essential (primary) hypertension: Secondary | ICD-10-CM

## 2019-01-05 LAB — POCT GLYCOSYLATED HEMOGLOBIN (HGB A1C)
Est. average glucose Bld gHb Est-mCnc: 157
Hemoglobin A1C: 7.1 % — AB (ref 4.0–5.6)

## 2019-01-06 ENCOUNTER — Other Ambulatory Visit: Payer: Self-pay | Admitting: Family Medicine

## 2019-02-13 ENCOUNTER — Other Ambulatory Visit: Payer: Self-pay | Admitting: Family Medicine

## 2019-02-13 DIAGNOSIS — I10 Essential (primary) hypertension: Secondary | ICD-10-CM

## 2019-02-13 MED ORDER — ENALAPRIL MALEATE 5 MG PO TABS: 5.0000 mg | ORAL_TABLET | Freq: Every day | ORAL | 3 refills | Status: DC

## 2019-02-13 NOTE — Telephone Encounter (Signed)
[facility] faxed refill request for the following medications:  enalapril (VASOTEC) 5 MG tablet    Please advise.

## 2019-03-14 ENCOUNTER — Other Ambulatory Visit: Payer: Self-pay | Admitting: Family Medicine

## 2019-03-16 ENCOUNTER — Other Ambulatory Visit: Payer: Self-pay | Admitting: Family Medicine

## 2019-03-16 DIAGNOSIS — M1990 Unspecified osteoarthritis, unspecified site: Secondary | ICD-10-CM

## 2019-03-16 NOTE — Progress Notes (Addendum)
Subjective:   Taylor Snyder is a 86 y.o. female who presents for Medicare Annual (Subsequent) preventive examination.  Review of Systems:  N/A Cardiac Risk Factors include: advanced age (>22mn, >>54women);diabetes mellitus;dyslipidemia;hypertension;obesity (BMI >30kg/m2)     Objective:     Vitals: BP (!) 158/58 (BP Location: Left Arm)   Pulse 86   Temp 97.8 F (36.6 C) (Oral)   Ht '5\' 3"'  (1.6 m)   Wt 169 lb 12.8 oz (77 kg)   BMI 30.08 kg/m   Body mass index is 30.08 kg/m.  Advanced Directives 03/20/2019 03/15/2018 03/04/2017 08/21/2016 08/12/2016 02/26/2016 12/24/2015  Does Patient Have a Medical Advance Directive? No No No No No No No  Does patient want to make changes to medical advance directive? - - - - - Yes (MAU/Ambulatory/Procedural Areas - Information given) -  Would patient like information on creating a medical advance directive? Yes (ED - Information included in AVS) No - Patient declined No - Patient declined No - Patient declined Yes (MAU/Ambulatory/Procedural Areas - Information given) - -    Tobacco Social History   Tobacco Use  Smoking Status Never Smoker  Smokeless Tobacco Never Used     Counseling given: Not Answered   Clinical Intake:  Pre-visit preparation completed: Yes  Pain : No/denies pain Pain Score: 0-No pain     Nutritional Status: BMI > 30  Obese Nutritional Risks: None Diabetes: Yes  How often do you need to have someone help you when you read instructions, pamphlets, or other written materials from your doctor or pharmacy?: 1 - Never   Diabetes:  Is the patient diabetic?  Yes  If diabetic, was a CBG obtained today?  No  Did the patient bring in their glucometer from home?  No  How often do you monitor your CBG's? Once a day.   Financial Strains and Diabetes Management:  Are you having any financial strains with the device, your supplies or your medication? No .  Does the patient want to be seen by Chronic Care Management for  management of their diabetes?  No  Would the patient like to be referred to a Nutritionist or for Diabetic Management?  No   Diabetic Exams:  Diabetic Eye Exam: Completed 10/05/18. Repeat yearly.  Diabetic Foot Exam: Completed 02/08/19. Repeat yearly.    Interpreter Needed?: No  Information [facility] :: [facility]  Past Medical History:  Diagnosis Date  . A-fib ([place]   . Anemia   . Asthma   . Colon polyps 2005  . [place] [id number] . Dysrhythmia   . GERD (gastroesophageal reflux disease)   . HOH (hard of hearing)    AIDS  . Hyperlipidemia   . Hypertension   . Obesity   . Presence of permanent cardiac pacemaker   . Rheumatic mitral valve failure   . Unspecified atrial fibrillation ([facility]    Past Surgical History:  Procedure Laterality Date  . ABDOMINAL HYSTERECTOMY    . APPENDECTOMY    . CATARACT EXTRACTION W/PHACO Right 09/29/2016   Procedure: CATARACT EXTRACTION PHACO AND INTRAOCULAR LENS PLACEMENT (IOC);  Surgeon: PBirder Robson MD;  Location: [facility] ORS;  Service: Ophthalmology;  Laterality: Right;  [place]46 AP% 18.9 CDE 8.69 Fluid pack lot # [id number]  . CATARACT EXTRACTION W/PHACO Left 10/27/2016   Procedure: CATARACT EXTRACTION PHACO AND INTRAOCULAR LENS PLACEMENT ([place];  Surgeon: PBirder Robson MD;  Location: [facility] ORS;  Service: Ophthalmology;  Laterality: Left;  [place]:49 AP% 22.4  CDE 11.03 Fluid pack lot # [id number]  . CHOLECYSTECTOMY  2008  . COLONOSCOPY  2005, 2014   Dr Bary Castilla  . FLEXIBLE SIGMOIDOSCOPY  2006  . INSERT / REPLACE / REMOVE PACEMAKER    . PACEMAKER INSERTION Left 08/21/2016   Procedure: PACEMAKER CHANGE OUT;  Surgeon: Marzetta Board, MD;  Location: [facility] ORS;  Service: Cardiovascular;  Laterality: Left;  . PACEMAKER PLACEMENT  2008  . salpingo oophorectmy     . TONSILLECTOMY     Family History  Problem Relation Age of Onset  . Hypertension Mother   . Stroke Father   . Heart disease Brother   .  Alcohol abuse Brother    Social History   Socioeconomic History  . Marital status: Married    Spouse name: Not on file  . Number of children: 1  . Years of education: Not on file  . Highest education level: 12th grade  Occupational History  . Occupation: RETIRED  . 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 Smoking status: Never Smoker  . Smokeless tobacco: Never Used  Substance and Sexual Activity  . Alcohol use: No  . Drug use: No  . Sexual activity: Never  Other Topics Concern  . Not on file  Social History Narrative  . Not on file   Social Determinants of Health   Financial Resource Strain: Low Risk   . Difficulty of Paying Living Expenses: Not hard at all  Food Insecurity: No Food Insecurity  . Worried About Charity fundraiser in the Last Year: Never true  . Ran Out of Food in the Last Year: Never true  Transportation Needs: No Transportation Needs  . Lack of Transportation (Medical): No  . Lack of Transportation (Non-Medical): No  Physical Activity: Inactive  . Days of Exercise per Week: 0 days  . Minutes of Exercise per Session: 0 min  Stress: No Stress Concern Present  . Feeling of Stress : Not at all  Social Connections: Not Isolated  . Frequency of Communication with Friends and Family: More than three times a week  . Frequency of Social Gatherings with Friends and Family: Once a week  . Attends Religious Services: More than 4 times per year  . Active Member of Clubs or Organizations: Yes  . Attends Archivist Meetings: Never  . Marital Status: Married    Outpatient Encounter Medications as of 03/20/2019  Medication Sig  . ADVAIR DISKUS 100-50 MCG/DOSE AEPB INHALE 1 PUFF INTO THE LUNGS TWICE DAILY  . Blood Glucose Monitoring Suppl (ACCU-CHEK GUIDE) w/Device KIT 1 each by Does not apply route daily.  . Calcium Carbonate-Vitamin D 600-400 MG-UNIT per tablet Take 1 tablet by mouth daily.   . cetirizine (ZYRTEC) 10 MG tablet Take 10  mg by mouth at bedtime as needed for allergies.   . Cholecalciferol (VITAMIN D3) 2000 units TABS Take by mouth.  . enalapril (VASOTEC) 5 MG tablet Take 1 tablet (5 mg total) by mouth daily.  Marland Kitchen FERROCITE 324 MG TABS tablet TAKE 1 TABLET BY MOUTH EVERY MORNING  . fluticasone (FLONASE) 50 MCG/ACT nasal spray USE 2 SPRAYS IN EACH NOSTRIL ONCE DAILY (Patient taking differently: Place 1 spray into both nostrils as needed. )  . Glucosamine-Chondroitin (OSTEO BI-FLEX REGULAR STRENGTH PO) Take by mouth daily. Twice a day  . glucose blood (ACCU-CHEK GUIDE) test strip Use as instructed  . Lancets Misc. (ACCU-CHEK SOFTCLIX LANCET DEV) KIT Dispense 1 kit  . metFORMIN (GLUCOPHAGE) 500  MG tablet TAKE 2 TABLETS BY MOUTH EVERY DAY  . Multiple Vitamins-Minerals (CENTRUM SILVER) tablet Take 1 tablet by mouth daily.   Marland Kitchen omeprazole (PRILOSEC) 20 MG capsule TAKE 1 CAPSULE(20 MG) BY MOUTH DAILY  . pravastatin (PRAVACHOL) 40 MG tablet Take 1 tablet (40 mg total) by mouth at bedtime.  . raloxifene (EVISTA) 60 MG tablet TAKE 1 TABLET(60 MG) BY MOUTH DAILY  . XARELTO 20 MG TABS tablet TAKE 1 TABLET BY MOUTH EVERY DAY  . acetaminophen (TYLENOL 8 HOUR) 650 MG CR tablet Take 650 mg by mouth daily as needed for pain.  . blood glucose meter kit and supplies Dispense based on patient and insurance preference. Use up to four times daily as directed. (FOR ICD-10 E10.9, E11.9). (Patient not taking: Reported on 03/20/2019)  . Ferrous Fumarate (FERROCITE) 324 (106 Fe) MG TABS tablet TK 1 T PO QAM  . glucose blood test strip Use to check glucose daily and as needed (Patient not taking: Reported on 03/20/2019)  . [DISCONTINUED] omeprazole (PRILOSEC) 20 MG capsule TAKE 1 CAPSULE(20 MG) BY MOUTH DAILY   No facility-administered encounter medications on file as of 03/20/2019.    Activities of Daily Living In your present state of health, do you have any difficulty performing the following activities: 03/20/2019  Hearing? N  Comment Has  bilateral hearing aids.  Vision? N  Difficulty concentrating or making decisions? N  Walking or climbing stairs? N  Dressing or bathing? N  Doing errands, shopping? N  Preparing Food and eating ? N  Using the Toilet? N  In the past six months, have you accidently leaked urine? N  Do you have problems with loss of bowel control? N  Managing your Medications? N  Managing your Finances? N  Housekeeping or managing your Housekeeping? N  Some recent data might be hidden    Patient Care Team: Jerrol Banana., MD as PCP - General (Family Medicine) Corey Skains, MD as Consulting Physician (Cardiology) Birder Robson, MD as Referring Physician (Ophthalmology) Elvina Mattes Adele Schilder as Attending Physician (Podiatry)    Assessment:   This is a routine wellness examination for Shanai.  Exercise Activities and Dietary recommendations Current Exercise Habits: The patient does not participate in regular exercise at present, Exercise limited by: None identified  Goals    . Cut out extra servings     Recommend to avoid junk food and if wanting a snack to eat fruits and vegetables instead.    . Reduce amount of fried foods in diet     Starting 02/26/16, I will try to avoid eating fried foods.       Fall Risk: Fall Risk  03/20/2019 04/05/2018 03/15/2018 03/04/2017 02/26/2016  Falls in the past year? 0 0 0 Yes Yes  Comment - - - - tripped  Number falls in past yr: 0 - - 1 1  Injury with Fall? 0 - - No No  Follow up - - - Falls prevention discussed Falls prevention discussed    FALL RISK PREVENTION PERTAINING TO THE HOME:  Any stairs in or around the home? Yes  If so, are there any without handrails? No   Home free of loose throw rugs in walkways, pet beds, electrical cords, etc? Yes  Adequate lighting in your home to reduce risk of falls? Yes   ASSISTIVE DEVICES UTILIZED TO PREVENT FALLS:  Life alert? No  Use of a cane, walker or w/c? Yes  Grab bars in the bathroom? No  Shower chair or bench in shower? Yes  Elevated toilet seat or a handicapped toilet? No   TIMED UP AND GO:  Was the test performed? No .    Depression Screen PHQ 2/9 Scores 03/20/2019 04/05/2018 03/15/2018 03/04/2017  PHQ - 2 Score 0 0 0 0     Cognitive Function: Declined today.      6CIT Screen 02/26/2016  What Year? 0 points  What month? 0 points  What time? 0 points  Count back from 20 0 points  Months in reverse 0 points  Repeat phrase 0 points  Total Score 0    Immunization History  Administered Date(s) Administered  . Fluad Quad(high Dose 65+) 01/05/2019  . Influenza, High Dose Seasonal PF 10/31/2014, 12/24/2015, 12/10/2016, 11/30/2017  . Pneumococcal Conjugate-13 07/19/2013  . Pneumococcal Polysaccharide-23 10/04/1997  . Td 03/01/2009    Qualifies for Shingles Vaccine? Yes . Due for Shingrix. Pt has been advised to call insurance company to determine out of pocket expense. Advised may also receive vaccine at local pharmacy or Health Dept. Verbalized acceptance and understanding.  Tdap: Although this vaccine is not a covered service during a Wellness Exam, does the patient still wish to receive this vaccine today?  No . Advised may receive this vaccine at local pharmacy or Health Dept. Aware to provide a copy of the vaccination record if obtained from local pharmacy or Health Dept. Verbalized acceptance and understanding.  Flu Vaccine: Up to date  Pneumococcal Vaccine: Completed series  Screening Tests Health Maintenance  Topic Date Due  . TETANUS/TDAP  03/19/2020 (Originally 03/02/2019)  . HEMOGLOBIN A1C  07/05/2019  . OPHTHALMOLOGY EXAM  10/05/2019  . FOOT EXAM  02/08/2020  . DEXA SCAN  08/02/2020  . INFLUENZA VACCINE  Completed  . PNA vac Low Risk Adult  Completed    Cancer Screenings:  Colorectal Screening: No longer required.   Mammogram: No longer required.   Bone Density: Completed 08/03/18. Results reflect OSTEOPOROSIS. Repeat every 2 years.    Lung Cancer Screening: (Low Dose CT Chest recommended if Age 73-80 years, 30 pack-year currently smoking OR have quit w/in 15years.) does not qualify.   Additional Screening:  Dental Screening: Recommended annual dental exams for proper oral hygiene   Community Resource Referral:  CRR required this visit?  No       Plan:  I have personally reviewed and addressed the Medicare Annual Wellness questionnaire and have noted the following in the patient's chart:  A. Medical and social history B. Use of alcohol, tobacco or illicit drugs  C. Current medications and supplements D. Functional ability and status E.  Nutritional status F.  Physical activity G. Advance directives H. List of other physicians I.  Hospitalizations, surgeries, and ER visits in previous 12 months J.  [place] such as hearing and vision if needed, cognitive and depression L. Referrals and appointments   In addition, I have reviewed and discussed with patient certain preventive protocols, quality metrics, and best practice recommendations. A written personalized care plan for preventive services as well as general preventive health recommendations were provided to patient.   Glendora Score, [place]  06/22/4330 Nurse Health Advisor   Nurse Notes: None.

## 2019-03-16 NOTE — Telephone Encounter (Signed)
[facility] is requesting a new prescription for   omeprazole (PRILOSEC) 20 MG capsule  Pharmacy comments: pt requests new rx: previously filled at different pharmacy

## 2019-03-17 MED ORDER — OMEPRAZOLE 20 MG PO CPDR: DELAYED_RELEASE_CAPSULE | ORAL | 1 refills | Status: DC

## 2019-03-20 ENCOUNTER — Other Ambulatory Visit: Payer: Self-pay

## 2019-03-20 ENCOUNTER — Ambulatory Visit (INDEPENDENT_AMBULATORY_CARE_PROVIDER_SITE_OTHER): Payer: Medicare Other

## 2019-03-20 ENCOUNTER — Ambulatory Visit (INDEPENDENT_AMBULATORY_CARE_PROVIDER_SITE_OTHER): Payer: Medicare Other | Admitting: Family Medicine

## 2019-03-20 VITALS — BP 158/58 | HR 86 | Temp 97.8°F | Ht 63.0 in | Wt 169.8 lb

## 2019-03-20 DIAGNOSIS — I10 Essential (primary) hypertension: Secondary | ICD-10-CM | POA: Diagnosis not present

## 2019-03-20 DIAGNOSIS — I495 Sick sinus syndrome: Secondary | ICD-10-CM

## 2019-03-20 DIAGNOSIS — E119 Type 2 diabetes mellitus without complications: Secondary | ICD-10-CM | POA: Diagnosis not present

## 2019-03-20 DIAGNOSIS — I4811 Longstanding persistent atrial fibrillation: Secondary | ICD-10-CM | POA: Diagnosis not present

## 2019-03-20 DIAGNOSIS — E7800 Pure hypercholesterolemia, unspecified: Secondary | ICD-10-CM

## 2019-03-20 DIAGNOSIS — Z0000 Encounter for general adult medical examination without abnormal findings: Secondary | ICD-10-CM

## 2019-03-20 NOTE — Progress Notes (Signed)
Patient: Taylor Snyder Female    DOB: 08/24/1928   86 y.o.   MRN: [id number] Visit Date: 03/20/2019  Today's Provider: [name], MD   No chief complaint on file.  Subjective:     Patient had AWV with [facility] today at 2:00 pm.  HPI  Overall patient and her husband are doing well.  They have a daughter, grandson, great grandson, expecting a great granddaughter. Type 2 diabetes mellitus without complication, without long-term current use of insulin ([place]) From 01/05/2019-no changes. HgB A1C-7.1.  Benign essential HTN From 01/05/2019-no changes.   Osteoporosis, unspecified osteoporosis type, unspecified pathological fracture presence From 01/05/2019-no changes.   Pure hypercholesterolemia From 08/04/2018-On pravachol. Labs stable.    Allergies  Allergen Reactions  . Codeine Sulfate Other (See Comments)    Altered mental status  . Codeine Other (See Comments)    Other Reaction: insomnia Unable to sleep, hypes her up     Current Outpatient Medications:  .  acetaminophen (TYLENOL 8 HOUR) 650 MG CR tablet, Take 650 mg by mouth daily as needed for pain., Disp: , Rfl:  .  ADVAIR DISKUS 100-50 MCG/DOSE AEPB, INHALE 1 PUFF INTO THE LUNGS TWICE DAILY, Disp: 180 each, Rfl: 1 .  blood glucose meter kit and supplies, Dispense based on patient and insurance preference. Use up to four times daily as directed. (FOR ICD-10 E10.9, E11.9)., Disp: 1 each, Rfl: 0 .  Blood Glucose Monitoring Suppl (ACCU-CHEK GUIDE) w/Device KIT, 1 each by Does not apply route daily., Disp: 1 kit, Rfl: 12 .  Calcium Carbonate-Vitamin D 600-400 MG-UNIT per tablet, Take 1 tablet by mouth daily. , Disp: , Rfl:  .  cetirizine (ZYRTEC) 10 MG tablet, Take 10 mg by mouth at bedtime as needed for allergies. , Disp: , Rfl:  .  Cholecalciferol (VITAMIN D3) 2000 units TABS, Take by mouth., Disp: , Rfl:  .  enalapril (VASOTEC) 5 MG tablet, Take 1 tablet (5 mg total) by mouth daily., Disp: 90 tablet, Rfl: 3 .   FERROCITE 324 MG TABS tablet, TAKE 1 TABLET BY MOUTH EVERY MORNING, Disp: 60 tablet, Rfl: 11 .  Ferrous Fumarate (FERROCITE) 324 (106 Fe) MG TABS tablet, TK 1 T PO QAM, Disp: , Rfl:  .  fluticasone (FLONASE) 50 MCG/ACT nasal spray, USE 2 SPRAYS IN EACH NOSTRIL ONCE DAILY (Patient taking differently: USE 1 SPRAYS IN EACH NOSTRIL ONCE DAILY AS NEEDED FOR ALLERGIES), Disp: 16 g, Rfl: 12 .  Glucosamine-Chondroitin (OSTEO BI-FLEX REGULAR STRENGTH PO), Take by mouth daily., Disp: , Rfl:  .  glucose blood (ACCU-CHEK GUIDE) test strip, Use as instructed, Disp: 100 each, Rfl: 12 .  glucose blood test strip, Use to check glucose daily and as needed, Disp: 100 each, Rfl: 11 .  Lancets Misc. (ACCU-CHEK SOFTCLIX LANCET DEV) KIT, Dispense 1 kit, Disp: 1 kit, Rfl: 11 .  metFORMIN (GLUCOPHAGE) 500 MG tablet, TAKE 2 TABLETS BY MOUTH EVERY DAY, Disp: 180 tablet, Rfl: 3 .  Multiple Vitamins-Minerals (CENTRUM SILVER) tablet, Take 1 tablet by mouth daily. , Disp: , Rfl:  .  omeprazole (PRILOSEC) 20 MG capsule, TAKE 1 CAPSULE(20 MG) BY MOUTH DAILY, Disp: 90 capsule, Rfl: 1 .  pravastatin (PRAVACHOL) 40 MG tablet, Take 1 tablet (40 mg total) by mouth at bedtime., Disp: 90 tablet, Rfl: 3 .  raloxifene (EVISTA) 60 MG tablet, TAKE 1 TABLET(60 MG) BY MOUTH DAILY, Disp: 30 tablet, Rfl: 5 .  XARELTO 20 MG TABS tablet, TAKE 1 TABLET  BY MOUTH EVERY DAY, Disp: 90 tablet, Rfl: 3  Review of Systems  Constitutional: Negative.   HENT: Negative.   Eyes: Negative.   Respiratory: Negative.   Cardiovascular: Positive for leg swelling.  Gastrointestinal: Negative.   Endocrine: Negative.   Allergic/Immunologic: Positive for environmental allergies.  Neurological: Negative.   Hematological: Negative.   Psychiatric/Behavioral: Negative.     Social History   Tobacco Use  . Smoking status: Never Smoker  . Smokeless tobacco: Never Used  Substance Use Topics  . Alcohol use: No      Objective:   Vitals:   BP 158/58 (BP  Location: Left Arm)  Pulse 86  Temp 97.8 F (36.6 C) (Oral)  Ht '5\' 3"'  (1.6 m)  Wt 169 lb 12.8 oz (77 kg)  BMI 30.08 kg/m  BSA 1.85 m     Physical Exam Vitals reviewed.  Constitutional:      Appearance: Normal appearance. She is well-developed and normal weight.  HENT:     Head: Normocephalic and atraumatic.     Right Ear: External ear normal.     Left Ear: External ear normal.     Nose: Nose normal.     Mouth/Throat:     Pharynx: Oropharynx is clear.  Eyes:     General: No scleral icterus.    Conjunctiva/sclera: Conjunctivae normal.  Neck:     Thyroid: No thyromegaly.     Vascular: No carotid bruit.  Cardiovascular:     Rate and Rhythm: Normal rate and regular rhythm.     Heart sounds: Normal heart sounds.  Pulmonary:     Effort: Pulmonary effort is normal.     Breath sounds: Normal breath sounds.  Abdominal:     Palpations: Abdomen is soft.  Musculoskeletal:     Comments: 1+ LE edema.  Skin:    General: Skin is warm and dry.  Neurological:     General: No focal deficit present.     Mental Status: She is alert and oriented to person, place, and time. Mental status is at baseline.  Psychiatric:        Mood and Affect: Mood normal.        Behavior: Behavior normal.        Thought Content: Thought content normal.        Judgment: Judgment normal.      No results found for any visits on 03/20/19.     Assessment & Plan    1. Encounter for Medicare annual wellness exam   2. Benign essential HTN Good control on enalapril  3. Sick sinus syndrome ([place]) Pacemaker in place  4. Type 2 diabetes mellitus without complication, without long-term current use of insulin ([facility]) Obtain hemoglobin A1c in the spring  5. Pure hypercholesterolemia On pravastatin  6. Longstanding persistent atrial fibrillation ([place]) On Xarelto    Follow up in 2/3 months.    I,Silvano Garofano,acting as a scribe for [name], MD.,have documented all relevant documentation  on the behalf of [name], MD,as directed by  [name], MD while in the presence of [name], MD.       [name], MD  [facility] [facility]

## 2019-03-20 NOTE — Patient Instructions (Addendum)
Taylor Snyder , Thank you for taking time to come for your Medicare Wellness Visit. I appreciate your ongoing commitment to your health goals. Please review the following plan we discussed and let me know if I can assist you in the future.   Screening recommendations/referrals: Colonoscopy: No longer required.  Mammogram: No longer required.  Bone Density: Up to date, due 07/2020 Recommended yearly ophthalmology/optometry visit for glaucoma screening and checkup Recommended yearly dental visit for hygiene and checkup  Vaccinations: Influenza vaccine: Up to date Pneumococcal vaccine: Completed series Tdap vaccine: Pt declines today.  Shingles vaccine: Pt declines today.     Advanced directives: Advance directive discussed with you today. I have provided a copy for you to complete at home and have notarized. Once this is complete please bring a copy in to our office so we can scan it into your chart.  Conditions/risks identified: Recommend to continue to cut out junk food in daily diet an eat more fruits and vegetables for snacks.   Next appointment: 2:40 PM today with [name].   Preventive Care 60 Years and Older, Female Preventive care refers to lifestyle choices and visits with your health care provider that can promote health and wellness. What does preventive care include?  A yearly physical exam. This is also called an annual well check.  Dental exams once or twice a year.  Routine eye exams. Ask your health care provider how often you should have your eyes checked.  Personal lifestyle choices, including:  Daily care of your teeth and gums.  Regular physical activity.  Eating a healthy diet.  Avoiding tobacco and drug use.  Limiting alcohol use.  Practicing safe sex.  Taking low-dose aspirin every day.  Taking vitamin and mineral supplements as recommended by your health care provider. What happens during an annual well check? The services and screenings done by  your health care provider during your annual well check will depend on your age, overall health, lifestyle risk factors, and family history of disease. Counseling  Your health care provider may ask you questions about your:  Alcohol use.  Tobacco use.  Drug use.  Emotional well-being.  Home and relationship well-being.  Sexual activity.  Eating habits.  History of falls.  Memory and ability to understand (cognition).  Work and work Statistician.  Reproductive health. Screening  You may have the following tests or measurements:  Height, weight, and BMI.  Blood pressure.  Lipid and cholesterol levels. These may be checked every 5 years, or more frequently if you are over 32 years old.  Skin check.  Lung cancer screening. You may have this screening every year starting at age 72 if you have a 30-pack-year history of smoking and currently smoke or have quit within the past 15 years.  Fecal occult blood test (FOBT) of the stool. You may have this test every year starting at age 109.  Flexible sigmoidoscopy or colonoscopy. You may have a sigmoidoscopy every 5 years or a colonoscopy every 10 years starting at age 57.  Hepatitis C blood test.  Hepatitis B blood test.  Sexually transmitted disease (STD) testing.  Diabetes screening. This is done by checking your blood sugar (glucose) after you have not eaten for a while (fasting). You may have this done every 1-3 years.  Bone density scan. This is done to screen for osteoporosis. You may have this done starting at age 56.  Mammogram. This may be done every 1-2 years. Talk to your health care provider about  how often you should have regular mammograms. Talk with your health care provider about your test results, treatment options, and if necessary, the need for more tests. Vaccines  Your health care provider may recommend certain vaccines, such as:  Influenza vaccine. This is recommended every year.  Tetanus,  diphtheria, and acellular pertussis (Tdap, Td) vaccine. You may need a Td booster every 10 years.  Zoster vaccine. You may need this after age 22.  Pneumococcal 13-valent conjugate (PCV13) vaccine. One dose is recommended after age 84.  Pneumococcal polysaccharide (PPSV23) vaccine. One dose is recommended after age 27. Talk to your health care provider about which screenings and vaccines you need and how often you need them. This information is not intended to replace advice given to you by your health care provider. Make sure you discuss any questions you have with your health care provider. Document Released: 03/01/2015 Document Revised: 10/23/2015 Document Reviewed: 12/04/2014 Elsevier Interactive Patient Education  2017 [employer] Prevention in the Home Falls can cause injuries. They can happen to people of all ages. There are many things you can do to make your home safe and to help prevent falls. What can I do on the outside of my home?  Regularly fix the edges of walkways and driveways and fix any cracks.  Remove anything that might make you trip as you walk through a door, such as a raised step or threshold.  Trim any bushes or trees on the path to your home.  Use bright outdoor lighting.  Clear any walking paths of anything that might make someone trip, such as rocks or tools.  Regularly check to see if handrails are loose or broken. Make sure that both sides of any steps have handrails.  Any raised decks and porches should have guardrails on the edges.  Have any leaves, snow, or ice cleared regularly.  Use sand or salt on walking paths during winter.  Clean up any spills in your garage right away. This includes oil or grease spills. What can I do in the bathroom?  Use night lights.  Install grab bars by the toilet and in the tub and shower. Do not use towel bars as grab bars.  Use non-skid mats or decals in the tub or shower.  If you need to sit down in  the shower, use a plastic, non-slip stool.  Keep the floor dry. Clean up any water that spills on the floor as soon as it happens.  Remove soap buildup in the tub or shower regularly.  Attach bath mats securely with double-sided non-slip rug tape.  Do not have throw rugs and other things on the floor that can make you trip. What can I do in the bedroom?  Use night lights.  Make sure that you have a light by your bed that is easy to reach.  Do not use any sheets or blankets that are too big for your bed. They should not hang down onto the floor.  Have a firm chair that has side arms. You can use this for support while you get dressed.  Do not have throw rugs and other things on the floor that can make you trip. What can I do in the kitchen?  Clean up any spills right away.  Avoid walking on wet floors.  Keep items that you use a lot in easy-to-reach places.  If you need to reach something above you, use a strong step stool that has a grab bar.  Keep  electrical cords out of the way.  Do not use floor polish or wax that makes floors slippery. If you must use wax, use non-skid floor wax.  Do not have throw rugs and other things on the floor that can make you trip. What can I do with my stairs?  Do not leave any items on the stairs.  Make sure that there are handrails on both sides of the stairs and use them. Fix handrails that are broken or loose. Make sure that handrails are as long as the stairways.  Check any carpeting to make sure that it is firmly attached to the stairs. Fix any carpet that is loose or worn.  Avoid having throw rugs at the top or bottom of the stairs. If you do have throw rugs, attach them to the floor with carpet tape.  Make sure that you have a light switch at the top of the stairs and the bottom of the stairs. If you do not have them, ask someone to add them for you. What else can I do to help prevent falls?  Wear shoes that:  Do not have high  heels.  Have rubber bottoms.  Are comfortable and fit you well.  Are closed at the toe. Do not wear sandals.  If you use a stepladder:  Make sure that it is fully opened. Do not climb a closed stepladder.  Make sure that both sides of the stepladder are locked into place.  Ask someone to hold it for you, if possible.  Clearly mark and make sure that you can see:  Any grab bars or handrails.  First and last steps.  Where the edge of each step is.  Use tools that help you move around (mobility aids) if they are needed. These include:  Canes.  Walkers.  Scooters.  Crutches.  Turn on the lights when you go into a dark area. Replace any light bulbs as soon as they burn out.  Set up your furniture so you have a clear path. Avoid moving your furniture around.  If any of your floors are uneven, fix them.  If there are any pets around you, be aware of where they are.  Review your medicines with your doctor. Some medicines can make you feel dizzy. This can increase your chance of falling. Ask your doctor what other things that you can do to help prevent falls. This information is not intended to replace advice given to you by your health care provider. Make sure you discuss any questions you have with your health care provider. Document Released: 11/29/2008 Document Revised: 07/11/2015 Document Reviewed: 03/09/2014 Elsevier Interactive Patient Education  2017 [employer].

## 2019-04-14 DIAGNOSIS — Z23 Encounter for immunization: Secondary | ICD-10-CM | POA: Diagnosis not present

## 2019-04-19 ENCOUNTER — Other Ambulatory Visit: Payer: Self-pay | Admitting: Family Medicine

## 2019-04-19 DIAGNOSIS — M810 Age-related osteoporosis without current pathological fracture: Secondary | ICD-10-CM

## 2019-04-19 NOTE — Telephone Encounter (Signed)
[facility] faxed refill request for the following medications:  raloxifene (EVISTA) 60 MG tablet  Please advise.  Thanks, [employer]

## 2019-04-20 MED ORDER — RALOXIFENE HCL 60 MG PO TABS: ORAL_TABLET | ORAL | 5 refills | Status: DC

## 2019-05-09 DIAGNOSIS — E1142 Type 2 diabetes mellitus with diabetic polyneuropathy: Secondary | ICD-10-CM | POA: Diagnosis not present

## 2019-05-09 DIAGNOSIS — B351 Tinea unguium: Secondary | ICD-10-CM | POA: Diagnosis not present

## 2019-05-09 DIAGNOSIS — L851 Acquired keratosis [keratoderma] palmaris et plantaris: Secondary | ICD-10-CM | POA: Diagnosis not present

## 2019-05-13 ENCOUNTER — Other Ambulatory Visit: Payer: Self-pay | Admitting: Family Medicine

## 2019-05-13 DIAGNOSIS — I10 Essential (primary) hypertension: Secondary | ICD-10-CM

## 2019-05-15 ENCOUNTER — Other Ambulatory Visit: Payer: Self-pay | Admitting: Family Medicine

## 2019-05-15 NOTE — Telephone Encounter (Signed)
Requested Prescriptions  Pending Prescriptions Disp Refills  . Accu-Chek FastClix Lancets MISC [Pharmacy Med Name: ACCU-CHEK FASTCLIX LANCET DRUM] 102 each 9    Sig: USE TO CHECK GLUCOSE DAILY AND AS NEEDED     Endocrinology: Diabetes - Testing Supplies Passed - 05/15/2019  3:03 PM      Passed - Valid encounter within last 12 months    Recent Outpatient Visits          1 month ago Encounter for [employer] annual wellness exam   [facility] Jerrol Banana., MD   4 months ago Type 2 diabetes mellitus without complication, without long-term current use of insulin [facility])   [facility] Jerrol Banana., MD   9 months ago Type 2 diabetes mellitus without complication, without long-term current use of insulin [facility])   [facility] Jerrol Banana., MD   1 year ago Type 2 diabetes mellitus without complication, without long-term current use of insulin [facility])   [facility] Jerrol Banana., MD   1 year ago Type 2 diabetes mellitus without complication, without long-term current use of insulin [facility])   [facility] Jerrol Banana., MD      Future Appointments            In 1 month Jerrol Banana., MD [facility]

## 2019-05-19 DIAGNOSIS — Z23 Encounter for immunization: Secondary | ICD-10-CM | POA: Diagnosis not present

## 2019-05-29 ENCOUNTER — Other Ambulatory Visit: Payer: Self-pay | Admitting: Family Medicine

## 2019-06-14 NOTE — Progress Notes (Signed)
Established patient visit   I,[name],acting as a scribe for [name], MD.,have documented all relevant documentation on the behalf of [name], MD,as directed by  [name], MD while in the presence of [name], MD.   Patient: Taylor Snyder   DOB: [birth year]   86 y.o. Female  MRN: [id number] Visit Date: 06/19/2019  Today's healthcare provider: [name], MD   Chief Complaint  Patient presents with  . Follow-up  . Diabetes   Subjective    HPI  Patient feels well.  She is married for 67 years to this point.  She has 1 daughter 1 grandson 1 great grandson and a great granddaughter on the way.  She has had both Covid vaccines. Her only complaint is 1 of chronic right shoulder pain when she abducts her right shoulder/arm. Diabetes Mellitus Type II, follow-up  Lab Results  Component Value Date   HGBA1C 7.3 (A) 06/19/2019   HGBA1C 7.1 (A) 01/05/2019   HGBA1C 7.1 (A) 08/04/2018   Last seen for diabetes 5 months ago.  Management since then includes continuing the same treatment. She reports good compliance with treatment. She is not having side effects. none  Home blood sugar records: fasting range: 130  Episodes of hypoglycemia? No none   Current insulin regiment: n/a Most Recent Eye Exam: 10/05/2018  --------------------------------------------------------------------        Medications: Outpatient Medications Prior to Visit  Medication Sig  . Accu-Chek FastClix Lancets MISC USE TO CHECK GLUCOSE DAILY AND AS NEEDED  . acetaminophen (TYLENOL 8 HOUR) 650 MG CR tablet Take 650 mg by mouth daily as needed for pain.  Marland Kitchen ADVAIR DISKUS 100-50 MCG/DOSE AEPB INHALE 1 PUFF INTO THE LUNGS TWICE DAILY  . Blood Glucose Monitoring Suppl (ACCU-CHEK GUIDE) w/Device KIT 1 each by Does not apply route daily.  . Calcium Carbonate-Vitamin D 600-400 MG-UNIT per tablet Take 1 tablet by mouth daily.   . cetirizine (ZYRTEC) 10 MG tablet  Take 10 mg by mouth at bedtime as needed for allergies.   . Cholecalciferol (VITAMIN D3) 2000 units TABS Take by mouth.  . enalapril (VASOTEC) 5 MG tablet Take 1 tablet (5 mg total) by mouth daily.  Marland Kitchen FERROCITE 324 MG TABS tablet TAKE 1 TABLET BY MOUTH EVERY MORNING  . fluticasone (FLONASE) 50 MCG/ACT nasal spray USE 2 SPRAYS IN EACH NOSTRIL ONCE DAILY (Patient taking differently: Place 1 spray into both nostrils as needed. )  . Glucosamine-Chondroitin (OSTEO BI-FLEX REGULAR STRENGTH PO) Take by mouth daily. Twice a day  . glucose blood (ACCU-CHEK GUIDE) test strip Use as instructed  . Lancets Misc. (ACCU-CHEK SOFTCLIX LANCET DEV) KIT Dispense 1 kit  . metFORMIN (GLUCOPHAGE) 500 MG tablet TAKE 2 TABLETS BY MOUTH EVERYDAY  . Multiple Vitamins-Minerals (CENTRUM SILVER) tablet Take 1 tablet by mouth daily.   Marland Kitchen omeprazole (PRILOSEC) 20 MG capsule TAKE 1 CAPSULE(20 MG) BY MOUTH DAILY  . pravastatin (PRAVACHOL) 40 MG tablet Take 1 tablet (40 mg total) by mouth at bedtime.  . raloxifene (EVISTA) 60 MG tablet TAKE 1 TABLET(60 MG) BY MOUTH DAILY  . XARELTO 20 MG TABS tablet TAKE 1 TABLET BY MOUTH EVERY DAY  . blood glucose meter kit and supplies Dispense based on patient and insurance preference. Use up to four times daily as directed. (FOR ICD-10 E10.9, E11.9). (Patient not taking: Reported on 03/20/2019)  . Ferrous Fumarate (FERROCITE) 324 (106 Fe) MG TABS tablet TK 1 T PO QAM  .  glucose blood test strip Use to check glucose daily and as needed (Patient not taking: Reported on 03/20/2019)   No facility-administered medications prior to visit.    Review of Systems  Constitutional: Negative for appetite change, chills, fatigue and fever.  HENT: Negative.   Eyes: Negative.   Respiratory: Negative for chest tightness and shortness of breath.   Cardiovascular: Negative for chest pain and palpitations.  Gastrointestinal: Negative for abdominal pain, nausea and vomiting.  Endocrine: Negative.    Musculoskeletal: Positive for arthralgias.  Allergic/Immunologic: Negative.   Neurological: Negative for dizziness and weakness.  Hematological: Negative.   Psychiatric/Behavioral: Negative.     Last hemoglobin A1c Lab Results  Component Value Date   HGBA1C 7.3 (A) 06/19/2019      Objective    BP (!) 147/77 (BP Location: Right Arm, Patient Position: Sitting, Cuff Size: Large)   Pulse 72   Temp (!) 96.9 F (36.1 C) (Other (Comment))   Resp 16   Ht '5\' 3"'  (1.6 m)   Wt 164 lb (74.4 kg)   SpO2 95%   BMI 29.05 kg/m  BP Readings from Last 3 Encounters:  06/19/19 (!) 147/77  03/20/19 (!) 158/58  01/05/19 137/72   Wt Readings from Last 3 Encounters:  06/19/19 164 lb (74.4 kg)  03/20/19 169 lb 12.8 oz (77 kg)  01/05/19 167 lb 6.4 oz (75.9 kg)      Physical Exam Vitals reviewed.  Constitutional:      Appearance: Normal appearance. She is well-developed and normal weight.  HENT:     Head: Normocephalic and atraumatic.     Right Ear: External ear normal.     Left Ear: External ear normal.     Nose: Nose normal.     Mouth/Throat:     Pharynx: Oropharynx is clear.  Eyes:     General: No scleral icterus.    Conjunctiva/sclera: Conjunctivae normal.  Neck:     Thyroid: No thyromegaly.     Vascular: No carotid bruit.  Cardiovascular:     Rate and Rhythm: Normal rate and regular rhythm.     Heart sounds: Normal heart sounds.  Pulmonary:     Effort: Pulmonary effort is normal.     Breath sounds: Normal breath sounds.  Abdominal:     Palpations: Abdomen is soft.  Musculoskeletal:     Comments: 1+ LE edema. Mild pain/impingement with abduction of the right shoulder past 90 degrees.  Skin:    General: Skin is warm and dry.  Neurological:     General: No focal deficit present.     Mental Status: She is alert and oriented to person, place, and time.  Psychiatric:        Mood and Affect: Mood normal.        Behavior: Behavior normal.        Thought Content: Thought  content normal.        Judgment: Judgment normal.       Results for orders placed or performed in visit on 06/19/19  POCT glycosylated hemoglobin (Hb A1C)  Result Value Ref Range   Hemoglobin A1C 7.3 (A) 4.0 - 5.6 %   Est. average glucose Bld gHb Est-mCnc 163     Assessment & Plan     1. Type 2 diabetes mellitus without complication, without long-term current use of insulin (HCC) On Metformin - POCT glycosylated hemoglobin (Hb A1C) 7.3 today.  2. Arthropathy of right shoulder Try meloxicam 7.5 mg daily for 15 days.  I am reluctant  to do more than this in a patient who is on Xarelto.  She is aware of the reasons - meloxicam (MOBIC) 7.5 MG tablet; Take 1 tablet (7.5 mg total) by mouth daily.  Dispense: 15 tablet; Refill: 0  3. Longstanding persistent atrial fibrillation (HCC) On Eliquis  4. Benign essential HTN On enalapril  5. Allergic rhinitis, unspecified seasonality, unspecified trigger   6. Pure hypercholesterolemia On pravastatin   Return in about 4 months (around 10/20/2019).      I, [name], MD, have reviewed all documentation for this visit. The documentation on 06/19/19 for the exam, diagnosis, procedures, and orders are all accurate and complete.    Richard Cranford Mon, MD  [facility] [phone] (phone) [phone] (fax)  [place]

## 2019-06-19 ENCOUNTER — Other Ambulatory Visit: Payer: Self-pay

## 2019-06-19 ENCOUNTER — Encounter: Payer: Self-pay | Admitting: Family Medicine

## 2019-06-19 ENCOUNTER — Ambulatory Visit (INDEPENDENT_AMBULATORY_CARE_PROVIDER_SITE_OTHER): Payer: Medicare Other | Admitting: Family Medicine

## 2019-06-19 VITALS — BP 147/77 | HR 72 | Temp 96.9°F | Resp 16 | Ht 63.0 in | Wt 164.0 lb

## 2019-06-19 DIAGNOSIS — E7800 Pure hypercholesterolemia, unspecified: Secondary | ICD-10-CM

## 2019-06-19 DIAGNOSIS — I4811 Longstanding persistent atrial fibrillation: Secondary | ICD-10-CM | POA: Diagnosis not present

## 2019-06-19 DIAGNOSIS — I10 Essential (primary) hypertension: Secondary | ICD-10-CM

## 2019-06-19 DIAGNOSIS — M19011 Primary osteoarthritis, right shoulder: Secondary | ICD-10-CM

## 2019-06-19 DIAGNOSIS — J309 Allergic rhinitis, unspecified: Secondary | ICD-10-CM | POA: Diagnosis not present

## 2019-06-19 DIAGNOSIS — E119 Type 2 diabetes mellitus without complications: Secondary | ICD-10-CM | POA: Diagnosis not present

## 2019-06-19 LAB — POCT GLYCOSYLATED HEMOGLOBIN (HGB A1C)
Est. average glucose Bld gHb Est-mCnc: 163
Hemoglobin A1C: 7.3 % — AB (ref 4.0–5.6)

## 2019-06-19 MED ORDER — MELOXICAM 7.5 MG PO TABS: 7.5000 mg | ORAL_TABLET | Freq: Every day | ORAL | 0 refills | Status: DC

## 2019-06-21 DIAGNOSIS — I480 Paroxysmal atrial fibrillation: Secondary | ICD-10-CM | POA: Diagnosis not present

## 2019-06-21 DIAGNOSIS — E782 Mixed hyperlipidemia: Secondary | ICD-10-CM | POA: Diagnosis not present

## 2019-06-21 DIAGNOSIS — I10 Essential (primary) hypertension: Secondary | ICD-10-CM | POA: Diagnosis not present

## 2019-06-21 DIAGNOSIS — I341 Nonrheumatic mitral (valve) prolapse: Secondary | ICD-10-CM | POA: Diagnosis not present

## 2019-06-21 DIAGNOSIS — I495 Sick sinus syndrome: Secondary | ICD-10-CM | POA: Diagnosis not present

## 2019-06-28 DIAGNOSIS — I495 Sick sinus syndrome: Secondary | ICD-10-CM | POA: Diagnosis not present

## 2019-07-05 ENCOUNTER — Other Ambulatory Visit: Payer: Self-pay | Admitting: Family Medicine

## 2019-07-05 DIAGNOSIS — M19011 Primary osteoarthritis, right shoulder: Secondary | ICD-10-CM

## 2019-07-05 NOTE — Telephone Encounter (Signed)
Refill refused. Per OV notes on 06/19/19 medication was ordered only for 15 days due to patient being on Xarelto.

## 2019-09-12 ENCOUNTER — Other Ambulatory Visit: Payer: Self-pay | Admitting: Family Medicine

## 2019-09-12 DIAGNOSIS — E7800 Pure hypercholesterolemia, unspecified: Secondary | ICD-10-CM

## 2019-09-13 ENCOUNTER — Other Ambulatory Visit: Payer: Self-pay | Admitting: Family Medicine

## 2019-09-13 DIAGNOSIS — M1990 Unspecified osteoarthritis, unspecified site: Secondary | ICD-10-CM

## 2019-09-13 NOTE — Telephone Encounter (Signed)
Requested Prescriptions  Pending Prescriptions Disp Refills   omeprazole (PRILOSEC) 20 MG capsule [Pharmacy Med Name: OMEPRAZOLE DR 20 MG CAPSULE] 90 capsule 1    Sig: TAKE 1 CAPSULE(20 MG) BY MOUTH DAILY     Gastroenterology: Proton Pump Inhibitors Passed - 09/13/2019  2:16 AM      Passed - Valid encounter within last 12 months    Recent Outpatient Visits          2 months ago Type 2 diabetes mellitus without complication, without long-term current use of insulin [facility])   [facility] Jerrol Banana., MD   5 months ago Encounter for [employer] annual wellness exam   [facility] Jerrol Banana., MD   8 months ago Type 2 diabetes mellitus without complication, without long-term current use of insulin [facility])   [facility] Jerrol Banana., MD   1 year ago Type 2 diabetes mellitus without complication, without long-term current use of insulin [facility])   [facility] Jerrol Banana., MD   1 year ago Type 2 diabetes mellitus without complication, without long-term current use of insulin [facility])   [facility] Jerrol Banana., MD      Future Appointments            In 1 month Jerrol Banana., MD [facility]

## 2019-09-25 ENCOUNTER — Other Ambulatory Visit: Payer: Self-pay | Admitting: Family Medicine

## 2019-10-11 DIAGNOSIS — I259 Chronic ischemic heart disease, unspecified: Secondary | ICD-10-CM | POA: Diagnosis not present

## 2019-10-11 DIAGNOSIS — Z8249 Family history of ischemic heart disease and other diseases of the circulatory system: Secondary | ICD-10-CM | POA: Diagnosis not present

## 2019-10-11 DIAGNOSIS — E7849 Other hyperlipidemia: Secondary | ICD-10-CM | POA: Diagnosis not present

## 2019-10-11 DIAGNOSIS — I4891 Unspecified atrial fibrillation: Secondary | ICD-10-CM | POA: Diagnosis not present

## 2019-10-11 DIAGNOSIS — I10 Essential (primary) hypertension: Secondary | ICD-10-CM | POA: Diagnosis not present

## 2019-10-11 DIAGNOSIS — I2510 Atherosclerotic heart disease of native coronary artery without angina pectoris: Secondary | ICD-10-CM | POA: Diagnosis not present

## 2019-10-11 DIAGNOSIS — E7801 Familial hypercholesterolemia: Secondary | ICD-10-CM | POA: Diagnosis not present

## 2019-10-11 DIAGNOSIS — Z1379 Encounter for other screening for genetic and chromosomal anomalies: Secondary | ICD-10-CM | POA: Diagnosis not present

## 2019-10-11 DIAGNOSIS — Z1389 Encounter for screening for other disorder: Secondary | ICD-10-CM | POA: Diagnosis not present

## 2019-10-13 DIAGNOSIS — I4891 Unspecified atrial fibrillation: Secondary | ICD-10-CM | POA: Diagnosis not present

## 2019-10-13 DIAGNOSIS — E7801 Familial hypercholesterolemia: Secondary | ICD-10-CM | POA: Diagnosis not present

## 2019-10-13 DIAGNOSIS — I10 Essential (primary) hypertension: Secondary | ICD-10-CM | POA: Diagnosis not present

## 2019-10-13 DIAGNOSIS — I2510 Atherosclerotic heart disease of native coronary artery without angina pectoris: Secondary | ICD-10-CM | POA: Diagnosis not present

## 2019-10-16 DIAGNOSIS — E119 Type 2 diabetes mellitus without complications: Secondary | ICD-10-CM | POA: Diagnosis not present

## 2019-10-16 LAB — HM DIABETES EYE EXAM

## 2019-10-19 ENCOUNTER — Other Ambulatory Visit: Payer: Self-pay | Admitting: Family Medicine

## 2019-10-19 ENCOUNTER — Encounter: Payer: Self-pay | Admitting: *Deleted

## 2019-10-19 DIAGNOSIS — M810 Age-related osteoporosis without current pathological fracture: Secondary | ICD-10-CM

## 2019-10-27 NOTE — Progress Notes (Signed)
Trena Platt Kaycen Whitworth,acting as a scribe for [name], MD.,have documented all relevant documentation on the behalf of [name], MD,as directed by  [name], MD while in the presence of [name], MD.  Established patient visit   Patient: Taylor Snyder   DOB: 1928-04-22   86 y.o. Female  MRN: [id number] Visit Date: 10/30/2019  Today's healthcare provider: [name], MD   Chief Complaint  Patient presents with  . Diabetes  . Hyperlipidemia  . Hypertension   Subjective    HPI  Pt doing well.She has no complaints. No cardiac symptoms or hypoglycemia. Diabetes Mellitus Type II, follow-up  Lab Results  Component Value Date   HGBA1C 7.3 (A) 06/19/2019   HGBA1C 7.1 (A) 01/05/2019   HGBA1C 7.1 (A) 08/04/2018   Last seen for diabetes 4 months ago.  Management since then includes continuing the same treatment. She reports excellent compliance with treatment. She is not having side effects.   Home blood sugar records: postprandial range: 188  Episodes of hypoglycemia? No    Current insulin regiment: none Most Recent Eye Exam: 10/16/19  --------------------------------------------------------------------------------------------------- Hypertension, follow-up  BP Readings from Last 3 Encounters:  10/30/19 121/67  06/19/19 (!) 147/77  03/20/19 (!) 158/58   Wt Readings from Last 3 Encounters:  10/30/19 165 lb (74.8 kg)  06/19/19 164 lb (74.4 kg)  03/20/19 169 lb 12.8 oz (77 kg)     She was last seen for hypertension 4 months ago.  BP at that visit was 147/77. Management since that visit includes; On enalapril. She reports excellent compliance with treatment. She is not having side effects.  She is exercising. She is adherent to low salt diet.   Outside blood pressures are not being checked.  She does not smoke.  Use of agents associated with hypertension: none.    --------------------------------------------------------------------------------------------------- Lipid/Cholesterol, follow-up  Last Lipid Panel: Lab Results  Component Value Date   CHOL 158 08/04/2018   LDLCALC 66 08/04/2018   HDL 71 08/04/2018   TRIG 107 08/04/2018    She was last seen for this 4 months ago.  Management since that visit includes; On pravastatin. She reports excellent compliance with treatment. She is not having side effects.  She is following a Regular diet. Current exercise: none  Last metabolic panel Lab Results  Component Value Date   GLUCOSE 144 (H) 08/04/2018   NA 137 08/04/2018   K 3.9 08/04/2018   BUN 11 08/04/2018   CREATININE 0.74 08/04/2018   GFRNONAA 73 08/04/2018   GFRAA 84 08/04/2018   CALCIUM 9.5 08/04/2018   AST 19 08/04/2018   ALT 15 08/04/2018   The ASCVD Risk score (Goff DC Jr., et al., 2013) failed to calculate for the following reasons:   The 2013 ASCVD risk score is only valid for ages 105 to 15  ---------------------------------------------------------------------------------------------------  Arthropathy of right shoulder From 06/19/2019-Try meloxicam 7.5 mg daily for 15 days.  I am reluctant to do more than this in a patient who is on Xarelto.  She is aware of the reasons.  Longstanding persistent atrial fibrillation (HCC) From 06/19/2019-On Eliquis     Social History   Tobacco Use  . Smoking status: Never Smoker  . Smokeless tobacco: Never Used  Vaping Use  . Vaping Use: Never used  Substance Use Topics  . Alcohol use: No  . Drug use: No       Medications: Outpatient Medications Prior to Visit  Medication Sig  .  Accu-Chek FastClix Lancets MISC USE TO CHECK GLUCOSE DAILY AND AS NEEDED  . acetaminophen (TYLENOL 8 HOUR) 650 MG CR tablet Take 650 mg by mouth daily as needed for pain.  Marland Kitchen ADVAIR DISKUS 100-50 MCG/DOSE AEPB INHALE 1 PUFF INTO THE LUNGS TWICE DAILY  . blood glucose meter kit and supplies  Dispense based on patient and insurance preference. Use up to four times daily as directed. (FOR ICD-10 E10.9, E11.9).  Marland Kitchen Blood Glucose Monitoring Suppl (ACCU-CHEK GUIDE) w/Device KIT 1 each by Does not apply route daily.  . Calcium Carbonate-Vitamin D 600-400 MG-UNIT per tablet Take 1 tablet by mouth daily.   . cetirizine (ZYRTEC) 10 MG tablet Take 10 mg by mouth at bedtime as needed for allergies.   . Cholecalciferol (VITAMIN D3) 2000 units TABS Take by mouth.  . enalapril (VASOTEC) 5 MG tablet Take 1 tablet (5 mg total) by mouth daily.  Marland Kitchen FERROCITE 324 MG TABS tablet TAKE 1 TABLET BY MOUTH EVERY MORNING  . Ferrous Fumarate (FERROCITE) 324 (106 Fe) MG TABS tablet TK 1 T PO QAM  . fluticasone (FLONASE) 50 MCG/ACT nasal spray USE 2 SPRAYS IN EACH NOSTRIL ONCE DAILY (Patient taking differently: Place 1 spray into both nostrils as needed. )  . Glucosamine-Chondroitin (OSTEO BI-FLEX REGULAR STRENGTH PO) Take by mouth daily. Twice a day  . glucose blood (ACCU-CHEK GUIDE) test strip Use as instructed  . glucose blood test strip Use to check glucose daily and as needed  . Lancets Misc. (ACCU-CHEK SOFTCLIX LANCET DEV) KIT Dispense 1 kit  . meloxicam (MOBIC) 7.5 MG tablet Take 1 tablet (7.5 mg total) by mouth daily.  . Multiple Vitamins-Minerals (CENTRUM SILVER) tablet Take 1 tablet by mouth daily.   Marland Kitchen omeprazole (PRILOSEC) 20 MG capsule TAKE 1 CAPSULE(20 MG) BY MOUTH DAILY  . pravastatin (PRAVACHOL) 40 MG tablet TAKE 1 TABLET BY MOUTH AT BEDTIME  . raloxifene (EVISTA) 60 MG tablet TAKE 1 TABLET(60 MG) BY MOUTH DAILY  . XARELTO 20 MG TABS tablet TAKE 1 TABLET BY MOUTH EVERY DAY  . metFORMIN (GLUCOPHAGE) 500 MG tablet TAKE 2 TABLETS BY MOUTH EVERYDAY (Patient not taking: Reported on 10/30/2019)   No facility-administered medications prior to visit.    Review of Systems  Constitutional: Negative for appetite change, chills, fatigue and fever.  Respiratory: Negative for chest tightness and shortness of  breath.   Cardiovascular: Negative for chest pain and palpitations.  Gastrointestinal: Negative for abdominal pain, nausea and vomiting.  Neurological: Negative for dizziness and weakness.    Last hemoglobin A1c Lab Results  Component Value Date   HGBA1C 7.5 (H) 10/31/2019      Objective    BP 121/67 (BP Location: Left Arm, Patient Position: Sitting, Cuff Size: Normal)   Pulse 75   Temp 98.4 F (36.9 C) (Oral)   Ht '5\' 3"'  (1.6 m)   Wt 165 lb (74.8 kg)   BMI 29.23 kg/m  BP Readings from Last 3 Encounters:  10/30/19 121/67  06/19/19 (!) 147/77  03/20/19 (!) 158/58   Wt Readings from Last 3 Encounters:  10/30/19 165 lb (74.8 kg)  06/19/19 164 lb (74.4 kg)  03/20/19 169 lb 12.8 oz (77 kg)      Physical Exam Vitals reviewed.  Constitutional:      Appearance: Normal appearance. She is well-developed and normal weight.  HENT:     Head: Normocephalic and atraumatic.     Right Ear: External ear normal.     Left Ear: External ear normal.  Nose: Nose normal.     Mouth/Throat:     Pharynx: Oropharynx is clear.  Eyes:     General: No scleral icterus.    Conjunctiva/sclera: Conjunctivae normal.  Neck:     Thyroid: No thyromegaly.     Vascular: No carotid bruit.  Cardiovascular:     Rate and Rhythm: Normal rate and regular rhythm.     Heart sounds: Normal heart sounds.  Pulmonary:     Effort: Pulmonary effort is normal.     Breath sounds: Normal breath sounds.  Abdominal:     Palpations: Abdomen is soft.  Skin:    General: Skin is warm and dry.  Neurological:     General: No focal deficit present.     Mental Status: She is alert and oriented to person, place, and time.  Psychiatric:        Mood and Affect: Mood normal.        Behavior: Behavior normal.        Thought Content: Thought content normal.        Judgment: Judgment normal.       No results found for any visits on 10/30/19.  Assessment & Plan     1. Type 2 diabetes mellitus without  complication, without long-term current use of insulin (HCC)  - CBC with Differential/Platelet - Comprehensive metabolic panel - TSH - Lipid panel - Hemoglobin A1c  2. Benign essential HTN  - CBC with Differential/Platelet - Comprehensive metabolic panel - TSH - Lipid panel - Hemoglobin A1c  3. Pure hypercholesterolemia  - CBC with Differential/Platelet - Comprehensive metabolic panel - TSH - Lipid panel - Hemoglobin A1c  4. Flu vaccine need  - Flu Vaccine QUAD High Dose(Fluad)  5. Longstanding persistent atrial fibrillation (HCC) On Xarelto. - CBC with Differential/Platelet - Comprehensive metabolic panel - TSH - Lipid panel - Hemoglobin A1c  6. Osteoporosis, unspecified osteoporosis type, unspecified pathological fracture presence  - CBC with Differential/Platelet - Comprehensive metabolic panel - TSH - Lipid panel - Hemoglobin A1c   No follow-ups on file.      I, [name], MD, have reviewed all documentation for this visit. The documentation on 11/04/19 for the exam, diagnosis, procedures, and orders are all accurate and complete.    Richard Cranford Mon, MD  [facility] [phone] (phone) [phone] (fax)  [place]

## 2019-10-30 ENCOUNTER — Other Ambulatory Visit: Payer: Self-pay

## 2019-10-30 ENCOUNTER — Ambulatory Visit (INDEPENDENT_AMBULATORY_CARE_PROVIDER_SITE_OTHER): Payer: Medicare Other | Admitting: Family Medicine

## 2019-10-30 ENCOUNTER — Encounter: Payer: Self-pay | Admitting: Family Medicine

## 2019-10-30 VITALS — BP 121/67 | HR 75 | Temp 98.4°F | Ht 63.0 in | Wt 165.0 lb

## 2019-10-30 DIAGNOSIS — Z23 Encounter for immunization: Secondary | ICD-10-CM

## 2019-10-30 DIAGNOSIS — M810 Age-related osteoporosis without current pathological fracture: Secondary | ICD-10-CM

## 2019-10-30 DIAGNOSIS — E119 Type 2 diabetes mellitus without complications: Secondary | ICD-10-CM

## 2019-10-30 DIAGNOSIS — I4811 Longstanding persistent atrial fibrillation: Secondary | ICD-10-CM | POA: Diagnosis not present

## 2019-10-30 DIAGNOSIS — I10 Essential (primary) hypertension: Secondary | ICD-10-CM

## 2019-10-30 DIAGNOSIS — E7800 Pure hypercholesterolemia, unspecified: Secondary | ICD-10-CM

## 2019-10-31 DIAGNOSIS — I4811 Longstanding persistent atrial fibrillation: Secondary | ICD-10-CM | POA: Diagnosis not present

## 2019-10-31 DIAGNOSIS — M810 Age-related osteoporosis without current pathological fracture: Secondary | ICD-10-CM | POA: Diagnosis not present

## 2019-10-31 DIAGNOSIS — E119 Type 2 diabetes mellitus without complications: Secondary | ICD-10-CM | POA: Diagnosis not present

## 2019-10-31 DIAGNOSIS — I10 Essential (primary) hypertension: Secondary | ICD-10-CM | POA: Diagnosis not present

## 2019-10-31 DIAGNOSIS — E7800 Pure hypercholesterolemia, unspecified: Secondary | ICD-10-CM | POA: Diagnosis not present

## 2019-11-01 LAB — CBC WITH DIFFERENTIAL/PLATELET
Basophils Absolute: 0 10*3/uL (ref 0.0–0.2)
Basos: 0 %
EOS (ABSOLUTE): 0.1 10*3/uL (ref 0.0–0.4)
Eos: 1 %
Hematocrit: 34.5 % (ref 34.0–46.6)
Hemoglobin: 11.7 g/dL (ref 11.1–15.9)
Immature Grans (Abs): 0 10*3/uL (ref 0.0–0.1)
Immature Granulocytes: 0 %
Lymphocytes Absolute: 1.9 10*3/uL (ref 0.7–3.1)
Lymphs: 36 %
MCH: 31.5 pg (ref 26.6–33.0)
MCHC: 33.9 g/dL (ref 31.5–35.7)
MCV: 93 fL (ref 79–97)
Monocytes Absolute: 0.6 10*3/uL (ref 0.1–0.9)
Monocytes: 11 %
Neutrophils Absolute: 2.6 10*3/uL (ref 1.4–7.0)
Neutrophils: 52 %
Platelets: 195 10*3/uL (ref 150–450)
RBC: 3.72 x10E6/uL — ABNORMAL LOW (ref 3.77–5.28)
RDW: 12.9 % (ref 11.7–15.4)
WBC: 5.1 10*3/uL (ref 3.4–10.8)

## 2019-11-01 LAB — LIPID PANEL
Chol/HDL Ratio: 2.2 ratio (ref 0.0–4.4)
Cholesterol, Total: 139 mg/dL (ref 100–199)
HDL: 64 mg/dL (ref >39)
LDL Chol Calc (NIH): 61 mg/dL (ref 0–99)
Triglycerides: 71 mg/dL (ref 0–149)
VLDL Cholesterol Cal: 14 mg/dL (ref 5–40)

## 2019-11-01 LAB — COMPREHENSIVE METABOLIC PANEL
ALT: 10 IU/L (ref 0–32)
AST: 17 IU/L (ref 0–40)
Albumin/Globulin Ratio: 2 (ref 1.2–2.2)
Albumin: 3.9 g/dL (ref 3.6–4.6)
Alkaline Phosphatase: 87 IU/L (ref 44–121)
BUN/Creatinine Ratio: 14 (ref 12–28)
BUN: 9 mg/dL (ref 8–27)
Bilirubin Total: 0.3 mg/dL (ref 0.0–1.2)
CO2: 23 mmol/L (ref 20–29)
Calcium: 8.9 mg/dL (ref 8.7–10.3)
Chloride: 103 mmol/L (ref 96–106)
Creatinine, Ser: 0.64 mg/dL (ref 0.57–1.00)
GFR calc Af Amer: 91 mL/min/{1.73_m2} (ref >59)
GFR calc non Af Amer: 79 mL/min/{1.73_m2} (ref >59)
Globulin, Total: 2 g/dL (ref 1.5–4.5)
Glucose: 145 mg/dL — ABNORMAL HIGH (ref 65–99)
Potassium: 4.4 mmol/L (ref 3.5–5.2)
Sodium: 139 mmol/L (ref 134–144)
Total Protein: 5.9 g/dL — ABNORMAL LOW (ref 6.0–8.5)

## 2019-11-01 LAB — HEMOGLOBIN A1C
Est. average glucose Bld gHb Est-mCnc: 169 mg/dL
Hgb A1c MFr Bld: 7.5 % — ABNORMAL HIGH (ref 4.8–5.6)

## 2019-11-01 LAB — TSH: TSH: 2.57 u[IU]/mL (ref 0.450–4.500)

## 2019-11-03 ENCOUNTER — Telehealth: Payer: Self-pay

## 2019-11-03 NOTE — Telephone Encounter (Signed)
Patient advised of lab results

## 2019-11-03 NOTE — Telephone Encounter (Signed)
-----   Message from Jerrol Banana., MD sent at 11/02/2019  4:30 PM EDT ----- Labs good, diabetes stable.

## 2019-11-07 DIAGNOSIS — I495 Sick sinus syndrome: Secondary | ICD-10-CM | POA: Diagnosis not present

## 2019-11-08 ENCOUNTER — Other Ambulatory Visit: Payer: Self-pay | Admitting: Family Medicine

## 2019-11-08 DIAGNOSIS — B351 Tinea unguium: Secondary | ICD-10-CM | POA: Diagnosis not present

## 2019-11-08 DIAGNOSIS — E1142 Type 2 diabetes mellitus with diabetic polyneuropathy: Secondary | ICD-10-CM | POA: Diagnosis not present

## 2019-11-16 ENCOUNTER — Encounter: Payer: Self-pay | Admitting: Family Medicine

## 2019-11-16 ENCOUNTER — Ambulatory Visit (INDEPENDENT_AMBULATORY_CARE_PROVIDER_SITE_OTHER): Payer: Medicare Other | Admitting: Family Medicine

## 2019-11-16 ENCOUNTER — Other Ambulatory Visit: Payer: Self-pay

## 2019-11-16 VITALS — BP 155/69 | HR 94 | Temp 98.1°F | Resp 15

## 2019-11-16 DIAGNOSIS — B029 Zoster without complications: Secondary | ICD-10-CM | POA: Diagnosis not present

## 2019-11-16 DIAGNOSIS — E119 Type 2 diabetes mellitus without complications: Secondary | ICD-10-CM

## 2019-11-16 DIAGNOSIS — I10 Essential (primary) hypertension: Secondary | ICD-10-CM

## 2019-11-16 DIAGNOSIS — I4811 Longstanding persistent atrial fibrillation: Secondary | ICD-10-CM | POA: Diagnosis not present

## 2019-11-16 MED ORDER — VALACYCLOVIR HCL 1 G PO TABS: 1000.0000 mg | ORAL_TABLET | Freq: Three times a day (TID) | ORAL | 0 refills | Status: DC

## 2019-11-16 MED ORDER — GABAPENTIN 100 MG PO CAPS: 100.0000 mg | ORAL_CAPSULE | Freq: Two times a day (BID) | ORAL | 4 refills | Status: DC

## 2019-11-16 NOTE — Progress Notes (Signed)
Established patient visit   Patient: Taylor Snyder   DOB: [birth year]   86 y.o. Female  MRN: [id number] Visit Date: 11/16/2019  Today's healthcare provider: [name], MD   Chief Complaint  Patient presents with  . Rash   Subjective    Rash This is a new problem. The current episode started today. The problem is unchanged. The affected locations include the abdomen. The rash is characterized by burning, redness and pain. She was exposed to nothing. Pertinent negatives include no anorexia, congestion, cough, diarrhea, eye pain, facial edema, fatigue, fever, joint pain, nail changes, rhinorrhea, shortness of breath, sore throat or vomiting. Past treatments include nothing.    Patient states that she was in her usual state of good health when yesterday she developed lower abdominal discomfort and felt like she had some extra gas.  This morning she woke up and had a rash in this area.  And now has a tingling type discomfort.  She states is not painful.  Of note is that she states her sugars went up a few days ago and trended up for her.  Patient Active Problem List   Diagnosis Date Noted  . A-fib ([place]) 06/18/2014  . Allergic rhinitis 06/18/2014  . Airway hyperreactivity 06/18/2014  . CAFL (chronic airflow limitation) ([place]) 06/18/2014  . Diabetes mellitus, type 2 ([place]) 06/18/2014  . HLD (hyperlipidemia) 06/18/2014  . BP (high blood pressure) 06/18/2014  . History of prolonged Q-T interval on ECG 06/18/2014  . Mitral valve disorder 06/18/2014  . Arthritis, degenerative 06/18/2014  . Adiposity 06/18/2014  . OP (osteoporosis) 06/18/2014  . Benign essential HTN 06/08/2014  . Anemia 12/28/2013  . Combined fat and carbohydrate induced hyperlipemia 12/28/2013  . MI (mitral incompetence) 12/28/2013  . Sick sinus syndrome ([place]) 12/28/2013  . Anemia, iron deficiency 10/18/2013  . Personal history of colonic polyps 08/29/2012  . Colon polyps    Past Medical History:  Diagnosis  Date  . A-fib ([place])   . Anemia   . Asthma   . Colon polyps 2005  . Diabetes mellitus without complication ([place]) 3086  . Dysrhythmia   . GERD (gastroesophageal reflux disease)   . HOH (hard of hearing)    AIDS  . Hyperlipidemia   . Hypertension   . Obesity   . Presence of permanent cardiac pacemaker   . Rheumatic mitral valve failure   . Unspecified atrial fibrillation (HCC)    Allergies  Allergen Reactions  . Codeine Sulfate Other (See Comments)    Altered mental status  . Codeine Other (See Comments)    Other Reaction: insomnia Unable to sleep, hypes her up       Medications: Outpatient Medications Prior to Visit  Medication Sig  . Accu-Chek FastClix Lancets MISC USE TO CHECK GLUCOSE DAILY AND AS NEEDED  . acetaminophen (TYLENOL 8 HOUR) 650 MG CR tablet Take 650 mg by mouth daily as needed for pain.  Marland Kitchen ADVAIR DISKUS 100-50 MCG/DOSE AEPB INHALE 1 PUFF INTO THE LUNGS TWICE DAILY  . blood glucose meter kit and supplies Dispense based on patient and insurance preference. Use up to four times daily as directed. (FOR ICD-10 E10.9, E11.9).  Marland Kitchen Blood Glucose Monitoring Suppl (ACCU-CHEK GUIDE) w/Device KIT 1 each by Does not apply route daily.  . Calcium Carbonate-Vitamin D 600-400 MG-UNIT per tablet Take 1 tablet by mouth daily.   . cetirizine (ZYRTEC) 10 MG tablet Take 10 mg by mouth at bedtime as needed for allergies.   Marland Kitchen  Cholecalciferol (VITAMIN D3) 2000 units TABS Take by mouth.  . enalapril (VASOTEC) 5 MG tablet Take 1 tablet (5 mg total) by mouth daily.  Marland Kitchen FERROCITE 324 MG TABS tablet TAKE 1 TABLET BY MOUTH EVERY MORNING  . Ferrous Fumarate (FERROCITE) 324 (106 Fe) MG TABS tablet TK 1 T PO QAM  . fluticasone (FLONASE) 50 MCG/ACT nasal spray USE 2 SPRAYS IN EACH NOSTRIL ONCE DAILY (Patient taking differently: Place 1 spray into both nostrils as needed. )  . Glucosamine-Chondroitin (OSTEO BI-FLEX REGULAR STRENGTH PO) Take by mouth daily. Twice a day  . glucose blood (ACCU-CHEK  GUIDE) test strip Use as instructed  . glucose blood test strip Use to check glucose daily and as needed  . Lancets Misc. (ACCU-CHEK SOFTCLIX LANCET DEV) KIT Dispense 1 kit  . meloxicam (MOBIC) 7.5 MG tablet Take 1 tablet (7.5 mg total) by mouth daily.  . metFORMIN (GLUCOPHAGE) 500 MG tablet TAKE 2 TABLETS BY MOUTH EVERYDAY  . Multiple Vitamins-Minerals (CENTRUM SILVER) tablet Take 1 tablet by mouth daily.   Marland Kitchen omeprazole (PRILOSEC) 20 MG capsule TAKE 1 CAPSULE(20 MG) BY MOUTH DAILY  . pravastatin (PRAVACHOL) 40 MG tablet TAKE 1 TABLET BY MOUTH AT BEDTIME  . raloxifene (EVISTA) 60 MG tablet TAKE 1 TABLET(60 MG) BY MOUTH DAILY  . XARELTO 20 MG TABS tablet TAKE 1 TABLET BY MOUTH EVERY DAY   No facility-administered medications prior to visit.    Review of Systems  Constitutional: Negative for fatigue and fever.  HENT: Negative for congestion, rhinorrhea and sore throat.   Eyes: Negative for pain.  Respiratory: Negative for cough and shortness of breath.   Gastrointestinal: Negative for anorexia, diarrhea and vomiting.  Musculoskeletal: Negative for joint pain.  Skin: Positive for rash. Negative for nail changes.       Objective    BP (!) 155/69   Pulse 94   Temp 98.1 F (36.7 C) (Oral)   Resp 15   SpO2 100%  BP Readings from Last 3 Encounters:  11/16/19 (!) 155/69  10/30/19 121/67  06/19/19 (!) 147/77   Wt Readings from Last 3 Encounters:  10/30/19 165 lb (74.8 kg)  06/19/19 164 lb (74.4 kg)  03/20/19 169 lb 12.8 oz (77 kg)      Physical Exam Vitals reviewed.  Constitutional:      Appearance: Normal appearance.  HENT:     Head: Normocephalic and atraumatic.  Cardiovascular:     Heart sounds: Normal heart sounds.  Skin:    Comments: Erythematous vesicular rash in right lower back and abdomen consistent with shingles  Neurological:     General: No focal deficit present.     Mental Status: She is alert and oriented to person, place, and time.  Psychiatric:         Mood and Affect: Mood normal.        Behavior: Behavior normal.        Thought Content: Thought content normal.        Judgment: Judgment normal.       No results found for any visits on 11/16/19.  Assessment & Plan     ,1. Herpes zoster without complication I asked her to follow-up if she has persistent pain.  Hopefully she does not develop a postherpetic neuralgia. - valACYclovir (VALTREX) 1000 MG tablet; Take 1 tablet (1,000 mg total) by mouth 3 (three) times daily.  Also discussed waiting at least 6 months to year before she considers the shingles vaccine which she has not had  dispense: 21 tablet; Refill: 0 - gabapentin (NEURONTIN) 100 MG capsule; Take 1 capsule (100 mg total) by mouth 2 (two) times daily.  Dispense: 60 capsule; Refill: 4  2. Longstanding persistent atrial fibrillation ([place])   3. Benign essential HTN   4. Type 2 diabetes mellitus without complication, without long-term current use of insulin (HCC) I think her sugars will trend down as she recovers from this.   No follow-ups on file.      I, [name], MD, have reviewed all documentation for this visit. The documentation on 11/16/19 for the exam, diagnosis, procedures, and orders are all accurate and complete.    Richard Cranford Mon, MD  [facility] [phone] (phone) [phone] (fax)  [place]

## 2019-11-21 ENCOUNTER — Ambulatory Visit: Payer: Medicare Other | Admitting: Family Medicine

## 2019-12-05 ENCOUNTER — Other Ambulatory Visit: Payer: Self-pay | Admitting: Family Medicine

## 2019-12-05 DIAGNOSIS — E7800 Pure hypercholesterolemia, unspecified: Secondary | ICD-10-CM

## 2019-12-05 NOTE — Telephone Encounter (Signed)
Requested Prescriptions  Pending Prescriptions Disp Refills   pravastatin (PRAVACHOL) 40 MG tablet [Pharmacy Med Name: PRAVASTATIN SODIUM 40 MG TAB] 90 tablet 3    Sig: TAKE 1 TABLET BY MOUTH EVERYDAY AT BEDTIME     Cardiovascular:  Antilipid - Statins Failed - 12/05/2019  1:29 AM      Failed - LDL in normal range and within 360 days    LDL Chol Calc (NIH)  Date Value Ref Range Status  10/31/2019 61 0 - 99 mg/dL Final         Passed - Total Cholesterol in normal range and within 360 days    Cholesterol, Total  Date Value Ref Range Status  10/31/2019 139 100 - 199 mg/dL Final         Passed - HDL in normal range and within 360 days    HDL  Date Value Ref Range Status  10/31/2019 64 >39 mg/dL Final         Passed - Triglycerides in normal range and within 360 days    Triglycerides  Date Value Ref Range Status  10/31/2019 71 0 - 149 mg/dL Final         Passed - Patient is not pregnant      Passed - Valid encounter within last 12 months    Recent Outpatient Visits          2 weeks ago Herpes zoster without complication   [facility] Jerrol Banana., MD   1 month ago Type 2 diabetes mellitus without complication, without long-term current use of insulin [facility])   [facility] Jerrol Banana., MD   5 months ago Type 2 diabetes mellitus without complication, without long-term current use of insulin [facility])   [facility] Jerrol Banana., MD   8 months ago Encounter for [employer] annual wellness exam   [facility] Jerrol Banana., MD   11 months ago Type 2 diabetes mellitus without complication, without long-term current use of insulin [facility])   [facility] Jerrol Banana., MD

## 2019-12-16 ENCOUNTER — Other Ambulatory Visit: Payer: Self-pay | Admitting: Family Medicine

## 2019-12-16 NOTE — Telephone Encounter (Signed)
Requested medications are due for refill today yes  Requested medications are on the active medication list yes  Last refill 8/2   Notes to clinic Failed protocol due to no Fe level within 360 days.

## 2019-12-18 ENCOUNTER — Other Ambulatory Visit: Payer: Self-pay | Admitting: Family Medicine

## 2019-12-19 DIAGNOSIS — E782 Mixed hyperlipidemia: Secondary | ICD-10-CM | POA: Diagnosis not present

## 2019-12-19 DIAGNOSIS — R0600 Dyspnea, unspecified: Secondary | ICD-10-CM | POA: Diagnosis not present

## 2019-12-19 DIAGNOSIS — I480 Paroxysmal atrial fibrillation: Secondary | ICD-10-CM | POA: Diagnosis not present

## 2019-12-19 DIAGNOSIS — I10 Essential (primary) hypertension: Secondary | ICD-10-CM | POA: Diagnosis not present

## 2019-12-19 DIAGNOSIS — I340 Nonrheumatic mitral (valve) insufficiency: Secondary | ICD-10-CM | POA: Diagnosis not present

## 2019-12-19 DIAGNOSIS — I495 Sick sinus syndrome: Secondary | ICD-10-CM | POA: Diagnosis not present

## 2019-12-19 DIAGNOSIS — I341 Nonrheumatic mitral (valve) prolapse: Secondary | ICD-10-CM | POA: Diagnosis not present

## 2019-12-26 ENCOUNTER — Other Ambulatory Visit: Payer: Self-pay

## 2019-12-26 ENCOUNTER — Ambulatory Visit
Admission: RE | Admit: 2019-12-26 | Discharge: 2019-12-26 | Disposition: A | Discharge status: Home / Self Care | Payer: Medicare Other | Attending: Family Medicine | Admitting: Family Medicine

## 2019-12-26 ENCOUNTER — Ambulatory Visit
Admission: RE | Admit: 2019-12-26 | Discharge: 2019-12-26 | Disposition: A | Discharge status: Home / Self Care | Payer: Medicare Other | Source: Ambulatory Visit | Attending: Family Medicine | Admitting: Family Medicine

## 2019-12-26 ENCOUNTER — Encounter: Payer: Self-pay | Admitting: Family Medicine

## 2019-12-26 ENCOUNTER — Ambulatory Visit (INDEPENDENT_AMBULATORY_CARE_PROVIDER_SITE_OTHER): Payer: Medicare Other | Admitting: Family Medicine

## 2019-12-26 VITALS — BP 157/55 | HR 80 | Temp 97.8°F | Wt 160.0 lb

## 2019-12-26 DIAGNOSIS — M25472 Effusion, left ankle: Secondary | ICD-10-CM | POA: Diagnosis not present

## 2019-12-26 DIAGNOSIS — M25572 Pain in left ankle and joints of left foot: Secondary | ICD-10-CM

## 2019-12-26 DIAGNOSIS — M810 Age-related osteoporosis without current pathological fracture: Secondary | ICD-10-CM | POA: Diagnosis not present

## 2019-12-26 DIAGNOSIS — I878 Other specified disorders of veins: Secondary | ICD-10-CM | POA: Diagnosis not present

## 2019-12-26 DIAGNOSIS — M7989 Other specified soft tissue disorders: Secondary | ICD-10-CM | POA: Diagnosis not present

## 2019-12-26 DIAGNOSIS — M79662 Pain in left lower leg: Secondary | ICD-10-CM | POA: Diagnosis not present

## 2019-12-26 NOTE — Progress Notes (Signed)
Acute Office Visit  Subjective:    Patient ID: Taylor Snyder, female    DOB: 1928-06-18, 86 y.o.   MRN: [id number]  No chief complaint on file.   HPI Patient is in today for her left foot pain.  Patient fell 5 weeks ago and hurt her foot.  She was not able to see anyone due to a severe case of shingles.  Her shingles were so bad that she was unable to put cloths on.   Past Medical History:  Diagnosis Date  . A-fib ([place])   . Anemia   . Asthma   . Colon polyps 2005  . Diabetes mellitus without complication ([place]) 7989  . Dysrhythmia   . GERD (gastroesophageal reflux disease)   . HOH (hard of hearing)    AIDS  . Hyperlipidemia   . Hypertension   . Obesity   . Presence of permanent cardiac pacemaker   . Rheumatic mitral valve failure   . Unspecified atrial fibrillation [facility])     Past Surgical History:  Procedure Laterality Date  . ABDOMINAL HYSTERECTOMY    . APPENDECTOMY    . CATARACT EXTRACTION W/PHACO Right 09/29/2016   Procedure: CATARACT EXTRACTION PHACO AND INTRAOCULAR LENS PLACEMENT (IOC);  Surgeon: Birder Robson, MD;  Location: [facility] ORS;  Service: Ophthalmology;  Laterality: Right;  [place] 00:46 AP% 18.9 CDE 8.69 Fluid pack lot # [id number]  . CATARACT EXTRACTION W/PHACO Left 10/27/2016   Procedure: CATARACT EXTRACTION PHACO AND INTRAOCULAR LENS PLACEMENT ([place]);  Surgeon: Birder Robson, MD;  Location: [facility] ORS;  Service: Ophthalmology;  Laterality: Left;  [place] 00:49 AP% 22.4 CDE 11.03 Fluid pack lot # [id number]  . CHOLECYSTECTOMY  2008  . COLONOSCOPY  2005, 2014   Dr Bary Castilla  . FLEXIBLE SIGMOIDOSCOPY  2006  . INSERT / REPLACE / REMOVE PACEMAKER    . PACEMAKER INSERTION Left 08/21/2016   Procedure: PACEMAKER CHANGE OUT;  Surgeon: Marzetta Board, MD;  Location: [facility] ORS;  Service: Cardiovascular;  Laterality: Left;  . PACEMAKER PLACEMENT  2008  . salpingo oophorectmy     . TONSILLECTOMY      Family History  Problem Relation Age of Onset  . Hypertension Mother     . Stroke Father   . Heart disease Brother   . Alcohol abuse Brother     Social History   Socioeconomic History  . Marital status: Married    Spouse name: Not on file  . Number of children: 1  . Years of education: Not on file  . Highest education level: 12th grade  Occupational History  . Occupation: RETIRED  . 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 Smoking status: Never Smoker  . Smokeless tobacco: Never Used  Vaping Use  . Vaping Use: Never used  Substance and Sexual Activity  . Alcohol use: No  . Drug use: No  . Sexual activity: Never  Other Topics Concern  . Not on file  Social History Narrative  . Not on file   Social Determinants of Health   Financial Resource Strain: Low Risk   . Difficulty of Paying Living Expenses: Not hard at all  Food Insecurity: No Food Insecurity  . Worried About Charity fundraiser in the Last Year: Never true  . Ran Out of Food in the Last Year: Never true  Transportation Needs: No Transportation Needs  . Lack of Transportation (Medical): No  . Lack of Transportation (Non-Medical): No  Physical Activity: Inactive  . Days of  Exercise per Week: 0 days  . Minutes of Exercise per Session: 0 min  Stress: No Stress Concern Present  . Feeling of Stress : Not at all  Social Connections: Socially Integrated  . Frequency of Communication with Friends and Family: More than three times a week  . Frequency of Social Gatherings with Friends and Family: Once a week  . Attends Religious Services: More than 4 times per year  . Active Member of Clubs or Organizations: Yes  . Attends Archivist Meetings: Never  . Marital Status: Married  Human resources officer Violence: Not At Risk  . Fear of Current or Ex-Partner: No  . Emotionally Abused: No  . Physically Abused: No  . Sexually Abused: No    Outpatient Medications Prior to Visit  Medication Sig Dispense Refill  . Accu-Chek FastClix Lancets MISC USE TO CHECK GLUCOSE  DAILY AND AS NEEDED 102 each 9  . ACCU-CHEK GUIDE test strip USE AS DIRECTED 100 strip 11  . acetaminophen (TYLENOL 8 HOUR) 650 MG CR tablet Take 650 mg by mouth daily as needed for pain.    Marland Kitchen ADVAIR DISKUS 100-50 MCG/DOSE AEPB INHALE 1 PUFF INTO THE LUNGS TWICE DAILY 180 each 1  . blood glucose meter kit and supplies Dispense based on patient and insurance preference. Use up to four times daily as directed. (FOR ICD-10 E10.9, E11.9). 1 each 0  . Blood Glucose Monitoring Suppl (ACCU-CHEK GUIDE) w/Device KIT 1 each by Does not apply route daily. 1 kit 12  . Calcium Carbonate-Vitamin D 600-400 MG-UNIT per tablet Take 1 tablet by mouth daily.     . cetirizine (ZYRTEC) 10 MG tablet Take 10 mg by mouth at bedtime as needed for allergies.     . Cholecalciferol (VITAMIN D3) 2000 units TABS Take by mouth.    . enalapril (VASOTEC) 5 MG tablet Take 1 tablet (5 mg total) by mouth daily. 90 tablet 3  . FERROCITE 324 MG TABS tablet TAKE 1 TABLET BY MOUTH EVERY MORNING 90 tablet 7  . Ferrous Fumarate (FERROCITE) 324 (106 Fe) MG TABS tablet TK 1 T PO QAM    . fluticasone (FLONASE) 50 MCG/ACT nasal spray USE 2 SPRAYS IN EACH NOSTRIL ONCE DAILY (Patient taking differently: Place 1 spray into both nostrils as needed. ) 16 g 12  . gabapentin (NEURONTIN) 100 MG capsule Take 1 capsule (100 mg total) by mouth 2 (two) times daily. 60 capsule 4  . Glucosamine-Chondroitin (OSTEO BI-FLEX REGULAR STRENGTH PO) Take by mouth daily. Twice a day    . glucose blood (ACCU-CHEK GUIDE) test strip Use as instructed 100 each 12  . glucose blood test strip Use to check glucose daily and as needed 100 each 11  . Lancets Misc. (ACCU-CHEK SOFTCLIX LANCET DEV) KIT Dispense 1 kit 1 kit 11  . meloxicam (MOBIC) 7.5 MG tablet Take 1 tablet (7.5 mg total) by mouth daily. 15 tablet 0  . metFORMIN (GLUCOPHAGE) 500 MG tablet TAKE 2 TABLETS BY MOUTH EVERYDAY 180 tablet 1  . Multiple Vitamins-Minerals (CENTRUM SILVER) tablet Take 1 tablet by mouth  daily.     Marland Kitchen omeprazole (PRILOSEC) 20 MG capsule TAKE 1 CAPSULE(20 MG) BY MOUTH DAILY 90 capsule 3  . pravastatin (PRAVACHOL) 40 MG tablet TAKE 1 TABLET BY MOUTH EVERYDAY AT BEDTIME 90 tablet 3  . raloxifene (EVISTA) 60 MG tablet TAKE 1 TABLET(60 MG) BY MOUTH DAILY 90 tablet 0  . valACYclovir (VALTREX) 1000 MG tablet Take 1 tablet (1,000 mg total) by mouth  3 (three) times daily. 21 tablet 0  . XARELTO 20 MG TABS tablet TAKE 1 TABLET BY MOUTH EVERY DAY 90 tablet 2   No facility-administered medications prior to visit.    Allergies  Allergen Reactions  . Codeine Sulfate Other (See Comments)    Altered mental status  . Codeine Other (See Comments)    Other Reaction: insomnia Unable to sleep, hypes her up    Review of Systems  Constitutional: Negative.   HENT: Negative.   Respiratory: Negative.   Cardiovascular: Negative.   Musculoskeletal: Positive for arthralgias, gait problem and joint swelling.       Foot pain       Objective:    Physical Exam Constitutional:      General: She is not in acute distress.    Appearance: She is well-developed.  HENT:     Head: Normocephalic and atraumatic.     Right Ear: Hearing normal.     Left Ear: Hearing normal.     Nose: Nose normal.  Eyes:     General: Lids are normal. No scleral icterus.       Right eye: No discharge.        Left eye: No discharge.     Conjunctiva/sclera: Conjunctivae normal.  Pulmonary:     Effort: Pulmonary effort is normal. No respiratory distress.  Musculoskeletal:        General: Swelling and tenderness present. Normal range of motion.     Comments: Left foot, lateral ankle and lower leg. Pulses are symmetric with some swelling. Pulses are symmetric.  Skin:    Findings: No lesion or rash.  Neurological:     Mental Status: She is alert and oriented to person, place, and time.  Psychiatric:        Speech: Speech normal.        Behavior: Behavior normal.        Thought Content: Thought content normal.      BP (!) 157/55 (BP Location: Right Arm, Patient Position: Sitting, Cuff Size: Normal)   Pulse 80   Temp 97.8 F (36.6 C) (Oral)   Wt 160 lb (72.6 kg)   SpO2 97%   BMI 28.34 kg/m  Wt Readings from Last 3 Encounters:  12/26/19 160 lb (72.6 kg)  10/30/19 165 lb (74.8 kg)  06/19/19 164 lb (74.4 kg)    There are no preventive care reminders to display for this patient.  There are no preventive care reminders to display for this patient.   Lab Results  Component Value Date   TSH 2.570 10/31/2019   Lab Results  Component Value Date   WBC 5.1 10/31/2019   HGB 11.7 10/31/2019   HCT 34.5 10/31/2019   MCV 93 10/31/2019   PLT 195 10/31/2019   Lab Results  Component Value Date   NA 139 10/31/2019   K 4.4 10/31/2019   CO2 23 10/31/2019   GLUCOSE 145 (H) 10/31/2019   BUN 9 10/31/2019   CREATININE 0.64 10/31/2019   BILITOT 0.3 10/31/2019   ALKPHOS 87 10/31/2019   AST 17 10/31/2019   ALT 10 10/31/2019   PROT 5.9 (L) 10/31/2019   ALBUMIN 3.9 10/31/2019   CALCIUM 8.9 10/31/2019   ANIONGAP 7 08/12/2016   Lab Results  Component Value Date   CHOL 139 10/31/2019   Lab Results  Component Value Date   HDL 64 10/31/2019   Lab Results  Component Value Date   LDLCALC 61 10/31/2019   Lab Results  Component Value  Date   TRIG 71 10/31/2019   Lab Results  Component Value Date   CHOLHDL 2.2 10/31/2019   Lab Results  Component Value Date   HGBA1C 7.5 (H) 10/31/2019       Assessment & Plan:   1. Pain and swelling of left ankle Onset after a fall at home 4-5 weeks ago. Still some swelling and tenderness over the lateral lower leg and malleolus. May use Tylenol Extra Strength for pain. Will get x-ray evaluation for possible fracture. May need walking cast for support pending reports. - DG Ankle Complete Left - DG Tibia/Fibula Left  2. Osteoporosis, unspecified osteoporosis type, unspecified pathological fracture presence Continues to take Evista and Calcium with  Vitamin D to try to maintain density in bones.     No orders of the defined types were placed in this encounter.    Juluis Mire, CMA

## 2019-12-27 ENCOUNTER — Telehealth: Payer: Self-pay

## 2019-12-27 NOTE — Telephone Encounter (Signed)
Copied from [place] [id number]. Topic: General - Other >> Dec 27, 2019  1:47 PM Oneta Rack wrote: Reason for CRM: patient inquiring about x ray results patient states experencing foot pain, please advise

## 2019-12-27 NOTE — Telephone Encounter (Signed)
See note on x-ray reports. May try Meloxicam 7.5 mg qd #15 and can schedule an orthopedic referral.

## 2019-12-27 NOTE — Telephone Encounter (Signed)
Please review and recommend on x-ray

## 2019-12-27 NOTE — Telephone Encounter (Signed)
Spoke to patient and advised that Taylor Snyder had not been in the office today to review the results.  Did let her know it appeared there are no fractures or dislocation.  Patient did say that in the area that she heard and felt the "pop" is hurting worse and radiates up the leg.  She is continuing with ice, elevation and rest.   Discussed possibility of ortho referral and told her I would mention it to you.  Copied from [place] [id number]. Topic: General - Call Back - No Documentation >> Dec 27, 2019  3:47 PM Erick Blinks wrote: Reason for CRM: Pt would like a call back to discuss Xray results, requesting Taylor Snyder contact: [phone]

## 2019-12-28 ENCOUNTER — Other Ambulatory Visit: Payer: Self-pay

## 2019-12-28 DIAGNOSIS — M19011 Primary osteoarthritis, right shoulder: Secondary | ICD-10-CM

## 2019-12-28 DIAGNOSIS — M25572 Pain in left ankle and joints of left foot: Secondary | ICD-10-CM

## 2019-12-28 MED ORDER — MELOXICAM 7.5 MG PO TABS: 7.5000 mg | ORAL_TABLET | Freq: Every day | ORAL | 0 refills | Status: DC

## 2019-12-28 NOTE — Telephone Encounter (Signed)
Patient notified and orders placed.

## 2020-01-01 DIAGNOSIS — S93402A Sprain of unspecified ligament of left ankle, initial encounter: Secondary | ICD-10-CM | POA: Diagnosis not present

## 2020-01-01 DIAGNOSIS — S92245A Nondisplaced fracture of medial cuneiform of left foot, initial encounter for closed fracture: Secondary | ICD-10-CM | POA: Diagnosis not present

## 2020-01-01 DIAGNOSIS — S92243A Displaced fracture of medial cuneiform of unspecified foot, initial encounter for closed fracture: Secondary | ICD-10-CM | POA: Insufficient documentation

## 2020-01-03 ENCOUNTER — Telehealth: Payer: Self-pay

## 2020-01-03 NOTE — Telephone Encounter (Signed)
Copied from [place] [id number]. Topic: Quick Communication - See Telephone Encounter >> Jan 03, 2020 10:11 AM Loma [place] wrote: CRM for notification. See Telephone encounter for: 01/03/20. PT specfic wants to speak to [facility] about ordering her a test  offered assistance, only wanted to talk to her. [phone]

## 2020-01-10 ENCOUNTER — Other Ambulatory Visit: Payer: Self-pay | Admitting: Family Medicine

## 2020-01-10 DIAGNOSIS — S92245A Nondisplaced fracture of medial cuneiform of left foot, initial encounter for closed fracture: Secondary | ICD-10-CM | POA: Diagnosis not present

## 2020-01-10 DIAGNOSIS — M810 Age-related osteoporosis without current pathological fracture: Secondary | ICD-10-CM

## 2020-01-15 DIAGNOSIS — R0600 Dyspnea, unspecified: Secondary | ICD-10-CM | POA: Diagnosis not present

## 2020-01-15 DIAGNOSIS — I340 Nonrheumatic mitral (valve) insufficiency: Secondary | ICD-10-CM | POA: Diagnosis not present

## 2020-01-18 DIAGNOSIS — Z23 Encounter for immunization: Secondary | ICD-10-CM | POA: Diagnosis not present

## 2020-01-19 ENCOUNTER — Other Ambulatory Visit: Payer: Self-pay | Admitting: Family Medicine

## 2020-01-19 DIAGNOSIS — M25572 Pain in left ankle and joints of left foot: Secondary | ICD-10-CM

## 2020-01-19 NOTE — Telephone Encounter (Signed)
Not a pt of [place]

## 2020-01-19 NOTE — Telephone Encounter (Signed)
Requested medications are due for refill today yes   Requested medications are on the active medication list yes   Last refill 11/11   Notes to clinic Was given Mobic after radiology report and was instructed to make appt with Ortho. No appt with ortho listed in Epic but does have appt with [name] in Feb., please assess.

## 2020-01-19 NOTE — Telephone Encounter (Signed)
Requested medications are due for refill today yes  Requested medications are on the active medication list yes  Last refill 11/11  Notes to clinic Was given Mobic after radiology report and was instructed to make appt with Ortho. Do not see appt, please assess.

## 2020-01-26 DIAGNOSIS — E118 Type 2 diabetes mellitus with unspecified complications: Secondary | ICD-10-CM | POA: Diagnosis not present

## 2020-02-07 DIAGNOSIS — B351 Tinea unguium: Secondary | ICD-10-CM | POA: Diagnosis not present

## 2020-02-07 DIAGNOSIS — E1142 Type 2 diabetes mellitus with diabetic polyneuropathy: Secondary | ICD-10-CM | POA: Diagnosis not present

## 2020-02-16 DIAGNOSIS — E118 Type 2 diabetes mellitus with unspecified complications: Secondary | ICD-10-CM | POA: Diagnosis not present

## 2020-02-20 ENCOUNTER — Other Ambulatory Visit: Payer: Self-pay | Admitting: Family Medicine

## 2020-02-20 DIAGNOSIS — I340 Nonrheumatic mitral (valve) insufficiency: Secondary | ICD-10-CM | POA: Diagnosis not present

## 2020-02-20 DIAGNOSIS — I10 Essential (primary) hypertension: Secondary | ICD-10-CM | POA: Diagnosis not present

## 2020-02-20 DIAGNOSIS — I480 Paroxysmal atrial fibrillation: Secondary | ICD-10-CM | POA: Diagnosis not present

## 2020-02-20 DIAGNOSIS — I341 Nonrheumatic mitral (valve) prolapse: Secondary | ICD-10-CM | POA: Diagnosis not present

## 2020-02-20 DIAGNOSIS — I495 Sick sinus syndrome: Secondary | ICD-10-CM | POA: Diagnosis not present

## 2020-02-20 DIAGNOSIS — M25572 Pain in left ankle and joints of left foot: Secondary | ICD-10-CM

## 2020-02-20 DIAGNOSIS — E782 Mixed hyperlipidemia: Secondary | ICD-10-CM | POA: Diagnosis not present

## 2020-03-15 ENCOUNTER — Other Ambulatory Visit: Payer: Self-pay | Admitting: Family Medicine

## 2020-03-18 DIAGNOSIS — E118 Type 2 diabetes mellitus with unspecified complications: Secondary | ICD-10-CM | POA: Diagnosis not present

## 2020-03-26 NOTE — Progress Notes (Addendum)
Subjective:   Taylor Snyder is a 87 y.o. female who presents for Medicare Annual (Subsequent) preventive examination.  Review of Systems    N/A  Cardiac Risk Factors include: advanced age (>68mn, >>13women);dyslipidemia;diabetes mellitus     Objective:    Today's Vitals   03/27/20 1329 03/27/20 1354  BP: (!) 144/64 (!) 136/58  Pulse: 92   Temp: 99.1 F (37.3 C)   TempSrc: Oral   SpO2: 94%   Weight: 161 lb 6.4 oz (73.2 kg)   Height: _0  (1.6 m)   PainSc: 0-No pain    Body mass index is 28.59 kg/m.  Advanced Directives 03/27/2020 03/20/2019 03/15/2018 03/04/2017 08/21/2016 08/12/2016 02/26/2016  Does Patient Have a Medical Advance Directive? _1  No No  Does patient want to make changes to medical advance directive? - - - - - - Yes (MAU/Ambulatory/Procedural Areas - Information given)  Would patient like information on creating a medical advance directive? No - Patient declined Yes (ED - Information included in AVS) No - Patient declined No - Patient declined No - Patient declined Yes (MAU/Ambulatory/Procedural Areas - Information given) -    Current Medications (verified) Outpatient Encounter Medications as of 03/27/2020  Medication Sig  . Accu-Chek FastClix Lancets MISC USE TO CHECK GLUCOSE DAILY AND AS NEEDED  . ACCU-CHEK GUIDE test strip USE AS DIRECTED  . acetaminophen (TYLENOL) 650 MG CR tablet Take 650 mg by mouth daily as needed for pain.  . Blood Glucose Monitoring Suppl (ACCU-CHEK GUIDE) w/Device KIT 1 each by Does not apply route daily.  . Calcium Carbonate-Vitamin D 600-400 MG-UNIT per tablet Take 1 tablet by mouth daily.   . cetirizine (ZYRTEC) 10 MG tablet Take 10 mg by mouth at bedtime as needed for allergies.   . Cholecalciferol (VITAMIN D3) 2000 units TABS Take by mouth.  . enalapril (VASOTEC) 5 MG tablet Take 1 tablet (5 mg total) by mouth daily.  .Marland KitchenFERROCITE 324 MG TABS tablet TAKE 1 TABLET BY MOUTH EVERY MORNING  . fluticasone (FLONASE) 50 MCG/ACT  nasal spray USE 2 SPRAYS IN EACH NOSTRIL ONCE DAILY (Patient taking differently: Place 1 spray into both nostrils as needed.)  . Fluticasone-Salmeterol (ADVAIR) 100-50 MCG/DOSE AEPB Inhale 1 puff into the lungs 2 (two) times daily as needed.  . gabapentin (NEURONTIN) 100 MG capsule Take 1 capsule (100 mg total) by mouth 2 (two) times daily.  . Glucosamine-Chondroitin (OSTEO BI-FLEX REGULAR STRENGTH PO) Take by mouth daily. Twice a day  . metFORMIN (GLUCOPHAGE) 500 MG tablet TAKE 2 TABLETS BY MOUTH EVERYDAY  . Multiple Vitamins-Minerals (CENTRUM SILVER) tablet Take 1 tablet by mouth daily.   .Marland Kitchenomeprazole (PRILOSEC) 20 MG capsule TAKE 1 CAPSULE(20 MG) BY MOUTH DAILY  . pravastatin (PRAVACHOL) 40 MG tablet TAKE 1 TABLET BY MOUTH EVERYDAY AT BEDTIME  . raloxifene (EVISTA) 60 MG tablet TAKE 1 TABLET(60 MG) BY MOUTH DAILY  . XARELTO 20 MG TABS tablet TAKE 1 TABLET BY MOUTH EVERY DAY  . blood glucose meter kit and supplies Dispense based on patient and insurance preference. Use up to four times daily as directed. (FOR ICD-10 E10.9, E11.9). (Patient not taking: Reported on 03/27/2020)  . Ferrous Fumarate (HEMOCYTE - 106 MG FE) 324 (106 Fe) MG TABS tablet TK 1 T PO QAM (Patient not taking: Reported on 03/27/2020)  . glucose blood (ACCU-CHEK GUIDE) test strip Use as instructed (Patient not taking: Reported on 03/27/2020)  . glucose blood test strip Use to check glucose daily  and as needed (Patient not taking: Reported on 03/27/2020)  . Lancets Misc. (ACCU-CHEK SOFTCLIX LANCET DEV) KIT Dispense 1 kit (Patient not taking: Reported on 03/27/2020)  . meloxicam (MOBIC) 7.5 MG tablet TAKE 1 TABLET BY MOUTH EVERY DAY (Patient not taking: Reported on 03/27/2020)  . valACYclovir (VALTREX) 1000 MG tablet Take 1 tablet (1,000 mg total) by mouth 3 (three) times daily. (Patient not taking: Reported on 03/27/2020)  . WIXELA INHUB 100-50 MCG/DOSE AEPB INHALE 1 PUFF INTO THE LUNGS TWICE DAILY (Patient not taking: Reported on 03/27/2020)    No facility-administered encounter medications on file as of 03/27/2020.    Allergies (verified) Codeine sulfate and Codeine   History: Past Medical History:  Diagnosis Date  . A-fib ([place])   . Anemia   . Asthma   . Colon polyps 2005  . Diabetes mellitus without complication ([place]) 6063  . Dysrhythmia   . GERD (gastroesophageal reflux disease)   . HOH (hard of hearing)    AIDS  . Hyperlipidemia   . Hypertension   . Obesity   . Presence of permanent cardiac pacemaker   . Rheumatic mitral valve failure   . Unspecified atrial fibrillation [facility])    Past Surgical History:  Procedure Laterality Date  . ABDOMINAL HYSTERECTOMY    . APPENDECTOMY    . CATARACT EXTRACTION W/PHACO Right 09/29/2016   Procedure: CATARACT EXTRACTION PHACO AND INTRAOCULAR LENS PLACEMENT (IOC);  Surgeon: Birder Robson, MD;  Location: [facility] ORS;  Service: Ophthalmology;  Laterality: Right;  [place] 00:46 AP% 18.9 CDE 8.69 Fluid pack lot # [id number]  . CATARACT EXTRACTION W/PHACO Left 10/27/2016   Procedure: CATARACT EXTRACTION PHACO AND INTRAOCULAR LENS PLACEMENT ([place]);  Surgeon: Birder Robson, MD;  Location: [facility] ORS;  Service: Ophthalmology;  Laterality: Left;  [place] 00:49 AP% 22.4 CDE 11.03 Fluid pack lot # [id number]  . CHOLECYSTECTOMY  2008  . COLONOSCOPY  2005, 2014   Dr Bary Castilla  . FLEXIBLE SIGMOIDOSCOPY  2006  . INSERT / REPLACE / REMOVE PACEMAKER    . PACEMAKER INSERTION Left 08/21/2016   Procedure: PACEMAKER CHANGE OUT;  Surgeon: Marzetta Board, MD;  Location: [facility] ORS;  Service: Cardiovascular;  Laterality: Left;  . PACEMAKER PLACEMENT  2008  . salpingo oophorectmy     . TONSILLECTOMY     Family History  Problem Relation Age of Onset  . Hypertension Mother   . Stroke Father   . Heart disease Brother   . Alcohol abuse Brother    Social History   Socioeconomic History  . Marital status: Married    Spouse name: Not on file  . Number of children: 1  . Years of education: Not on file  .  Highest education level: 12th grade  Occupational History  . Occupation: RETIRED  Tobacco Use  . Smoking status: Never Smoker  . Smokeless tobacco: Never Used  Vaping Use  . Vaping Use: Never used  Substance and Sexual Activity  . Alcohol use: No  . Drug use: No  . Sexual activity: Never  Other Topics Concern  . Not on file  Social History Narrative  . Not on file   Social Determinants of Health   Financial Resource Strain: Low Risk   . Difficulty of Paying Living Expenses: Not hard at all  Food Insecurity: No Food Insecurity  . Worried About Charity fundraiser in the Last Year: Never true  . Ran Out of Food in the Last Year: Never true  Transportation Needs: No Transportation Needs  .  Lack of Transportation (Medical): No  . Lack of Transportation (Non-Medical): No  Physical Activity: Inactive  . Days of Exercise per Week: 0 days  . Minutes of Exercise per Session: 0 min  Stress: No Stress Concern Present  . Feeling of Stress : Not at all  Social Connections: Socially Integrated  . Frequency of Communication with Friends and Family: More than three times a week  . Frequency of Social Gatherings with Friends and Family: Three times a week  . Attends Religious Services: More than 4 times per year  . Active Member of Clubs or Organizations: Yes  . Attends Archivist Meetings: More than 4 times per year  . Marital Status: Married    Tobacco Counseling Counseling given: Not Answered   Clinical Intake:  Pre-visit preparation completed: Yes  Pain : No/denies pain Pain Score: 0-No pain     Nutritional Status: BMI 25 -29 Overweight Nutritional Risks: None Diabetes: Yes  How often do you need to have someone help you when you read instructions, pamphlets, or other written materials from your doctor or pharmacy?: 1 - Never  Diabetic? Yes  Nutrition Risk Assessment:  Has the patient had any N/V/D within the last 2 months?  No  Does the patient have any  non-healing wounds?  No  Has the patient had any unintentional weight loss or weight gain?  No   Diabetes:  Is the patient diabetic?  Yes  If diabetic, was a CBG obtained today?  No  Did the patient bring in their glucometer from home?  No  How often do you monitor your CBG's? Once a day.   Financial Strains and Diabetes Management:  Are you having any financial strains with the device, your supplies or your medication? No .  Does the patient want to be seen by Chronic Care Management for management of their diabetes?  No  Would the patient like to be referred to a Nutritionist or for Diabetic Management?  No   Diabetic Exams:  Diabetic Eye Exam: Completed 10/16/19 Diabetic Foot Exam: Completed 10/30/19   Interpreter Needed?: No  Information entered by :: [facility], LPN   Activities of Daily Living In your present state of health, do you have any difficulty performing the following activities: 03/27/2020  Hearing? N  Comment Wears bilateral hearing aids.  Vision? N  Comment Wears eye glasses.  Difficulty concentrating or making decisions? N  Walking or climbing stairs? N  Dressing or bathing? N  Doing errands, shopping? N  Preparing Food and eating ? N  Using the Toilet? N  In the past six months, have you accidently leaked urine? N  Do you have problems with loss of bowel control? N  Managing your Medications? N  Managing your Finances? N  Housekeeping or managing your Housekeeping? N  Some recent data might be hidden    Patient Care Team: Jerrol Banana., MD as PCP - General (Family Medicine) Corey Skains, MD as Consulting Physician (Cardiology) Birder Robson, MD as Referring Physician (Ophthalmology) Sharlotte Alamo, DPM (Podiatry)  Indicate any recent Medical Services you may have received from other than [facility] providers in the past year (date may be approximate).     Assessment:   This is a routine wellness examination for Makhiya.  Hearing/Vision  screen No exam data present  Dietary issues and exercise activities discussed: Current Exercise Habits: The patient does not participate in regular exercise at present, Exercise limited by: None identified  Goals    .  Cut out extra servings     Recommend to avoid junk food and if wanting a snack to eat fruits and vegetables instead.      Depression Screen PHQ 2/9 Scores 03/27/2020 03/20/2019 04/05/2018 03/15/2018 03/04/2017 02/26/2016 10/31/2014  PHQ - 2 Score 0 0 0 0 0 0 0    Fall Risk Fall Risk  03/27/2020 03/20/2019 04/05/2018 03/15/2018 03/04/2017  Falls in the past year? 1 0 0 0 Yes  Comment - - - - -  Number falls in past yr: 0 0 - - 1  Injury with Fall? 1 0 - - No  Risk for fall due to : No Fall Risks - - - -  Follow up Falls prevention discussed - - - Falls prevention discussed    FALL RISK PREVENTION PERTAINING TO THE HOME:  Any stairs in or around the home? Yes  If so, are there any without handrails? No  Home free of loose throw rugs in walkways, pet beds, electrical cords, etc? Yes  Adequate lighting in your home to reduce risk of falls? Yes   ASSISTIVE DEVICES UTILIZED TO PREVENT FALLS:  Life alert? No  Use of a cane, walker or w/c? Yes  Grab bars in the bathroom? No  Shower chair or bench in shower? No  Elevated toilet seat or a handicapped toilet? No   TIMED UP AND GO:  Was the test performed? Yes .  Length of time to ambulate 10 feet: 10 sec.   Gait slow and steady without use of assistive device  Cognitive Function: Normal cognitive status assessed by observation by this Nurse Health Advisor. No abnormalities found.       6CIT Screen 02/26/2016  What Year? 0 points  What month? 0 points  What time? 0 points  Count back from 20 0 points  Months in reverse 0 points  Repeat phrase 0 points  Total Score 0    Immunizations Immunization History  Administered Date(s) Administered  . Fluad Quad(high Dose 65+) 01/05/2019, 10/30/2019  . Influenza, High Dose  Seasonal PF 10/31/2014, 12/24/2015, 12/10/2016, 11/30/2017  . Moderna Sars-Covid-2 Vaccination 04/04/2019, 05/02/2019, 01/18/2020  . Pneumococcal Conjugate-13 07/19/2013  . Pneumococcal Polysaccharide-23 10/04/1997  . Td 03/01/2009    TDAP status: Due, Education has been provided regarding the importance of this vaccine. Advised may receive this vaccine at local pharmacy or Health Dept. Aware to provide a copy of the vaccination record if obtained from local pharmacy or Health Dept. Verbalized acceptance and understanding.  Flu Vaccine status: Up to date  Pneumococcal vaccine status: Up to date  Covid-19 vaccine status: Completed vaccines  Qualifies for Shingles Vaccine? Yes   Zostavax completed No   Shingrix Completed?: No.    Education has been provided regarding the importance of this vaccine. Patient has been advised to call insurance company to determine out of pocket expense if they have not yet received this vaccine. Advised may also receive vaccine at local pharmacy or Health Dept. Verbalized acceptance and understanding.  Screening Tests Health Maintenance  Topic Date Due  . TETANUS/TDAP  03/27/2021 (Originally 03/02/2019)  . HEMOGLOBIN A1C  04/29/2020  . DEXA SCAN  08/02/2020  . OPHTHALMOLOGY EXAM  10/15/2020  . FOOT EXAM  10/29/2020  . INFLUENZA VACCINE  Completed  . COVID-19 Vaccine  Completed  . PNA vac Low Risk Adult  Completed    Health Maintenance  There are no preventive care reminders to display for this patient.  Colorectal cancer screening: No longer required.  Mammogram status: No longer required due to age.  Bone Density status: Completed 08/03/18. Results reflect: Bone density results: OSTEOPOROSIS. Repeat every 2 years.  Lung Cancer Screening: (Low Dose CT Chest recommended if Age 48-80 years, 30 pack-year currently smoking OR have quit w/in 15years.) does not qualify.   Additional Screening:  Vision Screening: Recommended annual ophthalmology  exams for early detection of glaucoma and other disorders of the eye. Is the patient up to date with their annual eye exam?  Yes  Who is the provider or what is the name of the office in which the patient attends annual eye exams? Dr George Ina @ [place] If pt is not established with a provider, would they like to be referred to a provider to establish care? No .   Dental Screening: Recommended annual dental exams for proper oral hygiene  Community Resource Referral / Chronic Care Management: CRR required this visit?  No   CCM required this visit?  No      Plan:     I have personally reviewed and noted the following in the patient's chart:   . Medical and social history . Use of alcohol, tobacco or illicit drugs  . Current medications and supplements . Functional ability and status . Nutritional status . Physical activity . Advanced directives . List of other physicians . Hospitalizations, surgeries, and ER visits in previous 12 months . Vitals . Screenings to include cognitive, depression, and falls . Referrals and appointments  In addition, I have reviewed and discussed with patient certain preventive protocols, quality metrics, and best practice recommendations. A written personalized care plan for preventive services as well as general preventive health recommendations were provided to patient.     Katianna Mcclenney [place], [place]   09/25/8419   Nurse Notes: None.

## 2020-03-27 ENCOUNTER — Other Ambulatory Visit: Payer: Self-pay

## 2020-03-27 ENCOUNTER — Ambulatory Visit (INDEPENDENT_AMBULATORY_CARE_PROVIDER_SITE_OTHER): Payer: Medicare Other | Admitting: Family Medicine

## 2020-03-27 ENCOUNTER — Ambulatory Visit (INDEPENDENT_AMBULATORY_CARE_PROVIDER_SITE_OTHER): Payer: Medicare Other

## 2020-03-27 VITALS — BP 136/58 | HR 92 | Temp 99.1°F | Ht 63.0 in | Wt 161.4 lb

## 2020-03-27 DIAGNOSIS — E119 Type 2 diabetes mellitus without complications: Secondary | ICD-10-CM | POA: Diagnosis not present

## 2020-03-27 DIAGNOSIS — I4819 Other persistent atrial fibrillation: Secondary | ICD-10-CM | POA: Diagnosis not present

## 2020-03-27 DIAGNOSIS — Z0000 Encounter for general adult medical examination without abnormal findings: Secondary | ICD-10-CM | POA: Diagnosis not present

## 2020-03-27 DIAGNOSIS — E7800 Pure hypercholesterolemia, unspecified: Secondary | ICD-10-CM

## 2020-03-27 DIAGNOSIS — I495 Sick sinus syndrome: Secondary | ICD-10-CM | POA: Diagnosis not present

## 2020-03-27 LAB — POCT GLYCOSYLATED HEMOGLOBIN (HGB A1C)
Est. average glucose Bld gHb Est-mCnc: 151
Hemoglobin A1C: 6.9 % — AB (ref 4.0–5.6)

## 2020-03-27 NOTE — Patient Instructions (Signed)
Taylor Snyder , Thank you for taking time to come for your Medicare Wellness Visit. I appreciate your ongoing commitment to your health goals. Please review the following plan we discussed and let me know if I can assist you in the future.   Screening recommendations/referrals: Colonoscopy: No longer required.  Mammogram: No longer required.  Bone Density: Up to date, due 07/2020 Recommended yearly ophthalmology/optometry visit for glaucoma screening and checkup Recommended yearly dental visit for hygiene and checkup  Vaccinations: Influenza vaccine: Done 10/30/19 Pneumococcal vaccine: Completed series Tdap vaccine: Currently due, declined receiving.  Shingles vaccine: Shingrix discussed. Please contact your pharmacy for coverage information.     Advanced directives: Advance directive discussed with you today. Even though you declined this today please call our office should you change your mind and we can give you the proper paperwork for you to fill out.  Conditions/risks identified: Recommend to avoid junk food and if wanting a snack to eat fruits and vegetables instead.  Next appointment: 2:00 PM today with [name]    Preventive Care 85 Years and Older, Female Preventive care refers to lifestyle choices and visits with your health care provider that can promote health and wellness. What does preventive care include?  A yearly physical exam. This is also called an annual well check.  Dental exams once or twice a year.  Routine eye exams. Ask your health care provider how often you should have your eyes checked.  Personal lifestyle choices, including:  Daily care of your teeth and gums.  Regular physical activity.  Eating a healthy diet.  Avoiding tobacco and drug use.  Limiting alcohol use.  Practicing safe sex.  Taking low-dose aspirin every day.  Taking vitamin and mineral supplements as recommended by your health care provider. What happens during an annual well  check? The services and screenings done by your health care provider during your annual well check will depend on your age, overall health, lifestyle risk factors, and family history of disease. Counseling  Your health care provider may ask you questions about your:  Alcohol use.  Tobacco use.  Drug use.  Emotional well-being.  Home and relationship well-being.  Sexual activity.  Eating habits.  History of falls.  Memory and ability to understand (cognition).  Work and work Statistician.  Reproductive health. Screening  You may have the following tests or measurements:  Height, weight, and BMI.  Blood pressure.  Lipid and cholesterol levels. These may be checked every 5 years, or more frequently if you are over 18 years old.  Skin check.  Lung cancer screening. You may have this screening every year starting at age 85 if you have a 30-pack-year history of smoking and currently smoke or have quit within the past 15 years.  Fecal occult blood test (FOBT) of the stool. You may have this test every year starting at age 85.  Flexible sigmoidoscopy or colonoscopy. You may have a sigmoidoscopy every 5 years or a colonoscopy every 10 years starting at age 85.  Hepatitis C blood test.  Hepatitis B blood test.  Sexually transmitted disease (STD) testing.  Diabetes screening. This is done by checking your blood sugar (glucose) after you have not eaten for a while (fasting). You may have this done every 1-3 years.  Bone density scan. This is done to screen for osteoporosis. You may have this done starting at age 85.  Mammogram. This may be done every 1-2 years. Talk to your health care provider about how often you  should have regular mammograms. Talk with your health care provider about your test results, treatment options, and if necessary, the need for more tests. Vaccines  Your health care provider may recommend certain vaccines, such as:  Influenza vaccine. This is  recommended every year.  Tetanus, diphtheria, and acellular pertussis (Tdap, Td) vaccine. You may need a Td booster every 10 years.  Zoster vaccine. You may need this after age 28.  Pneumococcal 13-valent conjugate (PCV13) vaccine. One dose is recommended after age 85.  Pneumococcal polysaccharide (PPSV23) vaccine. One dose is recommended after age 85. Talk to your health care provider about which screenings and vaccines you need and how often you need them. This information is not intended to replace advice given to you by your health care provider. Make sure you discuss any questions you have with your health care provider. Document Released: 03/01/2015 Document Revised: 10/23/2015 Document Reviewed: 12/04/2014 Elsevier Interactive Patient Education  2017 [place] Prevention in the Home Falls can cause injuries. They can happen to people of all ages. There are many things you can do to make your home safe and to help prevent falls. What can I do on the outside of my home?  Regularly fix the edges of walkways and driveways and fix any cracks.  Remove anything that might make you trip as you walk through a door, such as a raised step or threshold.  Trim any bushes or trees on the path to your home.  Use bright outdoor lighting.  Clear any walking paths of anything that might make someone trip, such as rocks or tools.  Regularly check to see if handrails are loose or broken. Make sure that both sides of any steps have handrails.  Any raised decks and porches should have guardrails on the edges.  Have any leaves, snow, or ice cleared regularly.  Use sand or salt on walking paths during winter.  Clean up any spills in your garage right away. This includes oil or grease spills. What can I do in the bathroom?  Use night lights.  Install grab bars by the toilet and in the tub and shower. Do not use towel bars as grab bars.  Use non-skid mats or decals in the tub or  shower.  If you need to sit down in the shower, use a plastic, non-slip stool.  Keep the floor dry. Clean up any water that spills on the floor as soon as it happens.  Remove soap buildup in the tub or shower regularly.  Attach bath mats securely with double-sided non-slip rug tape.  Do not have throw rugs and other things on the floor that can make you trip. What can I do in the bedroom?  Use night lights.  Make sure that you have a light by your bed that is easy to reach.  Do not use any sheets or blankets that are too big for your bed. They should not hang down onto the floor.  Have a firm chair that has side arms. You can use this for support while you get dressed.  Do not have throw rugs and other things on the floor that can make you trip. What can I do in the kitchen?  Clean up any spills right away.  Avoid walking on wet floors.  Keep items that you use a lot in easy-to-reach places.  If you need to reach something above you, use a strong step stool that has a grab bar.  Keep electrical cords out  of the way.  Do not use floor polish or wax that makes floors slippery. If you must use wax, use non-skid floor wax.  Do not have throw rugs and other things on the floor that can make you trip. What can I do with my stairs?  Do not leave any items on the stairs.  Make sure that there are handrails on both sides of the stairs and use them. Fix handrails that are broken or loose. Make sure that handrails are as long as the stairways.  Check any carpeting to make sure that it is firmly attached to the stairs. Fix any carpet that is loose or worn.  Avoid having throw rugs at the top or bottom of the stairs. If you do have throw rugs, attach them to the floor with carpet tape.  Make sure that you have a light switch at the top of the stairs and the bottom of the stairs. If you do not have them, ask someone to add them for you. What else can I do to help prevent  falls?  Wear shoes that:  Do not have high heels.  Have rubber bottoms.  Are comfortable and fit you well.  Are closed at the toe. Do not wear sandals.  If you use a stepladder:  Make sure that it is fully opened. Do not climb a closed stepladder.  Make sure that both sides of the stepladder are locked into place.  Ask someone to hold it for you, if possible.  Clearly mark and make sure that you can see:  Any grab bars or handrails.  First and last steps.  Where the edge of each step is.  Use tools that help you move around (mobility aids) if they are needed. These include:  Canes.  Walkers.  Scooters.  Crutches.  Turn on the lights when you go into a dark area. Replace any light bulbs as soon as they burn out.  Set up your furniture so you have a clear path. Avoid moving your furniture around.  If any of your floors are uneven, fix them.  If there are any pets around you, be aware of where they are.  Review your medicines with your doctor. Some medicines can make you feel dizzy. This can increase your chance of falling. Ask your doctor what other things that you can do to help prevent falls. This information is not intended to replace advice given to you by your health care provider. Make sure you discuss any questions you have with your health care provider. Document Released: 11/29/2008 Document Revised: 07/11/2015 Document Reviewed: 03/09/2014 Elsevier Interactive Patient Education  2017 [employer].

## 2020-03-27 NOTE — Progress Notes (Signed)
I,[name],acting as a scribe for [name], MD.,have documented all relevant documentation on the behalf of [name], MD,as directed by  [name], MD while in the presence of [name], MD.   Established patient visit   Patient: Taylor Snyder   DOB: 11/08/1928   87 y.o. Female  MRN: [id number] Visit Date: 03/27/2020  Today's healthcare provider: [name], MD   Chief Complaint  Patient presents with  . Follow-up  . Hypertension  . Diabetes   Subjective    HPI  She has been married for 55 years.  She has 1 daughter and 1 grandson and 2 great-grandchildren. Patient had AWV with [facility] today at 1:20 pm.  Diabetes Mellitus Type II, follow-up  Lab Results  Component Value Date   HGBA1C 6.9 (A) 03/27/2020   HGBA1C 7.5 (H) 10/31/2019   HGBA1C 7.3 (A) 06/19/2019   Last seen for diabetes 6 months ago.  Management since then includes continuing the same treatment. She reports good compliance with treatment. She is not having side effects. none  Home blood sugar records: fasting range: 130  Episodes of hypoglycemia? No none   Current insulin regiment: n/a Most Recent Eye Exam: 10/06/2019  --------------------------------------------------------------------  Hypertension, follow-up  BP Readings from Last 3 Encounters:  03/27/20 (!) 136/58  12/26/19 (!) 157/55  11/16/19 (!) 155/69   Wt Readings from Last 3 Encounters:  03/27/20 161 lb 6.4 oz (73.2 kg)  12/26/19 160 lb (72.6 kg)  10/30/19 165 lb (74.8 kg)     She was last seen for hypertension 6 months ago.  BP at that visit was 121/67. Management since that visit includes; on enalapril. She reports good compliance with treatment. She is not having side effects. none She is not exercising. She is adherent to low salt diet.   Outside blood pressures are not checking.  She does not smoke.  Use of agents associated with hypertension: none.    --------------------------------------------------------------------  Lipid/Cholesterol, follow-up  Last Lipid Panel: Lab Results  Component Value Date   CHOL 139 10/31/2019   LDLCALC 61 10/31/2019   HDL 64 10/31/2019   TRIG 71 10/31/2019    She was last seen for this 6 months ago.  Management since that visit includes; labs checked showing-good.  She reports good compliance with treatment. She is not having side effects. none She is following a Regular, Low Sodium diet. Current exercise: none  Last metabolic panel Lab Results  Component Value Date   GLUCOSE 145 (H) 10/31/2019   NA 139 10/31/2019   K 4.4 10/31/2019   BUN 9 10/31/2019   CREATININE 0.64 10/31/2019   GFRNONAA 79 10/31/2019   GFRAA 91 10/31/2019   CALCIUM 8.9 10/31/2019   AST 17 10/31/2019   ALT 10 10/31/2019   The ASCVD Risk score (Goff DC Jr., et al., 2013) failed to calculate for the following reasons:   The 2013 ASCVD risk score is only valid for ages 47 to 16  --------------------------------------------------------------------        Medications: Outpatient Medications Prior to Visit  Medication Sig  . Accu-Chek FastClix Lancets MISC USE TO CHECK GLUCOSE DAILY AND AS NEEDED  . ACCU-CHEK GUIDE test strip USE AS DIRECTED  . acetaminophen (TYLENOL) 650 MG CR tablet Take 650 mg by mouth daily as needed for pain.  . blood glucose meter kit and supplies Dispense based on patient and insurance preference. Use up to four times daily as directed. (FOR ICD-10  E10.9, E11.9). (Patient not taking: Reported on 03/27/2020)  . Blood Glucose Monitoring Suppl (ACCU-CHEK GUIDE) w/Device KIT 1 each by Does not apply route daily.  . Calcium Carbonate-Vitamin D 600-400 MG-UNIT per tablet Take 1 tablet by mouth daily.   . cetirizine (ZYRTEC) 10 MG tablet Take 10 mg by mouth at bedtime as needed for allergies.   . Cholecalciferol (VITAMIN D3) 2000 units TABS Take by mouth.  . enalapril (VASOTEC) 5 MG tablet Take 1  tablet (5 mg total) by mouth daily.  Marland Kitchen FERROCITE 324 MG TABS tablet TAKE 1 TABLET BY MOUTH EVERY MORNING  . Ferrous Fumarate (HEMOCYTE - 106 MG FE) 324 (106 Fe) MG TABS tablet TK 1 T PO QAM (Patient not taking: Reported on 03/27/2020)  . fluticasone (FLONASE) 50 MCG/ACT nasal spray USE 2 SPRAYS IN EACH NOSTRIL ONCE DAILY (Patient taking differently: Place 1 spray into both nostrils as needed.)  . Fluticasone-Salmeterol (ADVAIR) 100-50 MCG/DOSE AEPB Inhale 1 puff into the lungs 2 (two) times daily as needed.  . gabapentin (NEURONTIN) 100 MG capsule Take 1 capsule (100 mg total) by mouth 2 (two) times daily.  . Glucosamine-Chondroitin (OSTEO BI-FLEX REGULAR STRENGTH PO) Take by mouth daily. Twice a day  . glucose blood (ACCU-CHEK GUIDE) test strip Use as instructed (Patient not taking: Reported on 03/27/2020)  . glucose blood test strip Use to check glucose daily and as needed (Patient not taking: Reported on 03/27/2020)  . Lancets Misc. (ACCU-CHEK SOFTCLIX LANCET DEV) KIT Dispense 1 kit (Patient not taking: Reported on 03/27/2020)  . meloxicam (MOBIC) 7.5 MG tablet TAKE 1 TABLET BY MOUTH EVERY DAY (Patient not taking: Reported on 03/27/2020)  . metFORMIN (GLUCOPHAGE) 500 MG tablet TAKE 2 TABLETS BY MOUTH EVERYDAY  . Multiple Vitamins-Minerals (CENTRUM SILVER) tablet Take 1 tablet by mouth daily.   Marland Kitchen omeprazole (PRILOSEC) 20 MG capsule TAKE 1 CAPSULE(20 MG) BY MOUTH DAILY  . pravastatin (PRAVACHOL) 40 MG tablet TAKE 1 TABLET BY MOUTH EVERYDAY AT BEDTIME  . raloxifene (EVISTA) 60 MG tablet TAKE 1 TABLET(60 MG) BY MOUTH DAILY  . valACYclovir (VALTREX) 1000 MG tablet Take 1 tablet (1,000 mg total) by mouth 3 (three) times daily. (Patient not taking: Reported on 03/27/2020)  . WIXELA INHUB 100-50 MCG/DOSE AEPB INHALE 1 PUFF INTO THE LUNGS TWICE DAILY (Patient not taking: Reported on 03/27/2020)  . XARELTO 20 MG TABS tablet TAKE 1 TABLET BY MOUTH EVERY DAY   No facility-administered medications prior to visit.     Review of Systems  All other systems reviewed and are negative.   Last hemoglobin A1c Lab Results  Component Value Date   HGBA1C 6.9 (A) 03/27/2020       Objective     Vitals:  BP 136/58Important (BP Location: Right Arm)  Pulse 92  Temp 99.1 F (37.3 C) (Oral)  Ht '5\' 3"'  (1.6 m)  Wt 161 lb 6.4 oz (73.2 kg)  SpO2 94%  BMI 28.59 kg/m  BSA 1.8 m  Pain Flora 0-No pain        BP Readings from Last 3 Encounters:  03/27/20 (!) 136/58  12/26/19 (!) 157/55  11/16/19 (!) 155/69   Wt Readings from Last 3 Encounters:  03/27/20 161 lb 6.4 oz (73.2 kg)  12/26/19 160 lb (72.6 kg)  10/30/19 165 lb (74.8 kg)       Physical Exam Vitals reviewed.  Constitutional:      Appearance: Normal appearance. She is well-developed and normal weight.  HENT:     Head: Normocephalic  and atraumatic.     Right Ear: External ear normal.     Left Ear: External ear normal.     Nose: Nose normal.     Mouth/Throat:     Pharynx: Oropharynx is clear.  Eyes:     General: No scleral icterus.    Conjunctiva/sclera: Conjunctivae normal.  Neck:     Thyroid: No thyromegaly.     Vascular: No carotid bruit.  Cardiovascular:     Rate and Rhythm: Normal rate and regular rhythm.     Heart sounds: Normal heart sounds.  Pulmonary:     Effort: Pulmonary effort is normal.     Breath sounds: Normal breath sounds.  Abdominal:     Palpations: Abdomen is soft.  Musculoskeletal:     Comments: 1+ LE edema.  Skin:    General: Skin is warm and dry.     Comments: Mild left ankle swelling greater than right.  Neurological:     General: No focal deficit present.     Mental Status: She is alert and oriented to person, place, and time. Mental status is at baseline.  Psychiatric:        Mood and Affect: Mood normal.        Behavior: Behavior normal.        Thought Content: Thought content normal.        Judgment: Judgment normal.       Results for orders placed or performed in visit on 03/27/20   POCT glycosylated hemoglobin (Hb A1C)  Result Value Ref Range   Hemoglobin A1C 6.9 (A) 4.0 - 5.6 %   Est. average glucose Bld gHb Est-mCnc 151     Assessment & Plan     1. Type 2 diabetes mellitus without complication, without long-term current use of insulin (HCC) Good control with A1c of 6.9 - POCT glycosylated hemoglobin (Hb A1C)  2. Persistent atrial fibrillation (HCC) Xarelto  3. Sick sinus syndrome [facility] Cardiology  4. Pure hypercholesterolemia On statin.   Return in about 6 months (around 09/24/2020).      I, [name], MD, have reviewed all documentation for this visit. The documentation on 03/31/20 for the exam, diagnosis, procedures, and orders are all accurate and complete.    Kye Hedden Cranford Mon, MD  [facility] [phone] (phone) [phone] (fax)  [facility]

## 2020-03-29 ENCOUNTER — Other Ambulatory Visit: Payer: Self-pay | Admitting: Family Medicine

## 2020-03-29 DIAGNOSIS — E7800 Pure hypercholesterolemia, unspecified: Secondary | ICD-10-CM

## 2020-03-29 MED ORDER — PRAVASTATIN SODIUM 40 MG PO TABS: ORAL_TABLET | ORAL | 1 refills | Status: DC

## 2020-03-29 NOTE — Telephone Encounter (Signed)
Medication Refill - Medication:  pravastatin (PRAVACHOL) 40 MG tablet    Has the patient contacted their pharmacy? Yes.  Advised to contact PCP office.    Preferred Pharmacy (with phone number or street name):  [place], [place] [address] [phone]  Agent: Please be advised that RX refills may take up to 3 business days. We ask that you follow-up with your pharmacy.

## 2020-04-02 ENCOUNTER — Ambulatory Visit: Payer: Self-pay | Admitting: Family Medicine

## 2020-04-19 ENCOUNTER — Other Ambulatory Visit: Payer: Self-pay | Admitting: Family Medicine

## 2020-04-19 DIAGNOSIS — I10 Essential (primary) hypertension: Secondary | ICD-10-CM

## 2020-05-08 DIAGNOSIS — B351 Tinea unguium: Secondary | ICD-10-CM | POA: Diagnosis not present

## 2020-05-08 DIAGNOSIS — E1142 Type 2 diabetes mellitus with diabetic polyneuropathy: Secondary | ICD-10-CM | POA: Diagnosis not present

## 2020-05-16 DIAGNOSIS — E118 Type 2 diabetes mellitus with unspecified complications: Secondary | ICD-10-CM | POA: Diagnosis not present

## 2020-05-22 DIAGNOSIS — D2261 Melanocytic nevi of right upper limb, including shoulder: Secondary | ICD-10-CM | POA: Diagnosis not present

## 2020-05-22 DIAGNOSIS — D2271 Melanocytic nevi of right lower limb, including hip: Secondary | ICD-10-CM | POA: Diagnosis not present

## 2020-05-22 DIAGNOSIS — D225 Melanocytic nevi of trunk: Secondary | ICD-10-CM | POA: Diagnosis not present

## 2020-05-22 DIAGNOSIS — D2272 Melanocytic nevi of left lower limb, including hip: Secondary | ICD-10-CM | POA: Diagnosis not present

## 2020-05-22 DIAGNOSIS — D2262 Melanocytic nevi of left upper limb, including shoulder: Secondary | ICD-10-CM | POA: Diagnosis not present

## 2020-05-22 DIAGNOSIS — L821 Other seborrheic keratosis: Secondary | ICD-10-CM | POA: Diagnosis not present

## 2020-05-29 ENCOUNTER — Other Ambulatory Visit: Payer: Self-pay | Admitting: Family Medicine

## 2020-05-29 DIAGNOSIS — M810 Age-related osteoporosis without current pathological fracture: Secondary | ICD-10-CM

## 2020-05-29 MED ORDER — RALOXIFENE HCL 60 MG PO TABS: ORAL_TABLET | ORAL | 0 refills | Status: DC

## 2020-05-29 NOTE — Telephone Encounter (Signed)
Future visit in 3 months  

## 2020-05-29 NOTE — Telephone Encounter (Signed)
Medication Refill - Medication: raloxifene (EVISTA) 60 MG tablet   Has the patient contacted their pharmacy? Yes.    (Agent: If yes, when and what did the pharmacy advise?) It would be faster if the patient contacted her PCP office  Preferred Pharmacy (with phone number or street name):   [facility] [place], [place] - [address] AT [address] Phone:  [phone]  Fax:  [phone]       Agent: Please be advised that RX refills may take up to 3 business days. We ask that you follow-up with your pharmacy.

## 2020-06-15 DIAGNOSIS — E118 Type 2 diabetes mellitus with unspecified complications: Secondary | ICD-10-CM | POA: Diagnosis not present

## 2020-07-03 ENCOUNTER — Other Ambulatory Visit: Payer: Self-pay | Admitting: Family Medicine

## 2020-07-16 DIAGNOSIS — E118 Type 2 diabetes mellitus with unspecified complications: Secondary | ICD-10-CM | POA: Diagnosis not present

## 2020-08-13 DIAGNOSIS — B351 Tinea unguium: Secondary | ICD-10-CM | POA: Diagnosis not present

## 2020-08-13 DIAGNOSIS — E1142 Type 2 diabetes mellitus with diabetic polyneuropathy: Secondary | ICD-10-CM | POA: Diagnosis not present

## 2020-08-15 DIAGNOSIS — E118 Type 2 diabetes mellitus with unspecified complications: Secondary | ICD-10-CM | POA: Diagnosis not present

## 2020-08-27 DIAGNOSIS — I495 Sick sinus syndrome: Secondary | ICD-10-CM | POA: Diagnosis not present

## 2020-08-30 DIAGNOSIS — Z20822 Contact with and (suspected) exposure to covid-19: Secondary | ICD-10-CM | POA: Diagnosis not present

## 2020-09-02 ENCOUNTER — Other Ambulatory Visit: Payer: Self-pay | Admitting: Family Medicine

## 2020-09-02 DIAGNOSIS — M810 Age-related osteoporosis without current pathological fracture: Secondary | ICD-10-CM

## 2020-09-15 DIAGNOSIS — E118 Type 2 diabetes mellitus with unspecified complications: Secondary | ICD-10-CM | POA: Diagnosis not present

## 2020-09-24 ENCOUNTER — Other Ambulatory Visit: Payer: Self-pay

## 2020-09-24 ENCOUNTER — Encounter: Payer: Self-pay | Admitting: Family Medicine

## 2020-09-24 ENCOUNTER — Ambulatory Visit (INDEPENDENT_AMBULATORY_CARE_PROVIDER_SITE_OTHER): Payer: Medicare Other | Admitting: Family Medicine

## 2020-09-24 VITALS — BP 131/72 | HR 80 | Resp 18 | Ht 63.0 in | Wt 161.0 lb

## 2020-09-24 DIAGNOSIS — I10 Essential (primary) hypertension: Secondary | ICD-10-CM

## 2020-09-24 DIAGNOSIS — J309 Allergic rhinitis, unspecified: Secondary | ICD-10-CM

## 2020-09-24 DIAGNOSIS — Z860100 Personal history of colon polyps, unspecified: Secondary | ICD-10-CM

## 2020-09-24 DIAGNOSIS — E7800 Pure hypercholesterolemia, unspecified: Secondary | ICD-10-CM

## 2020-09-24 DIAGNOSIS — I4819 Other persistent atrial fibrillation: Secondary | ICD-10-CM | POA: Diagnosis not present

## 2020-09-24 DIAGNOSIS — I495 Sick sinus syndrome: Secondary | ICD-10-CM

## 2020-09-24 DIAGNOSIS — E1169 Type 2 diabetes mellitus with other specified complication: Secondary | ICD-10-CM | POA: Diagnosis not present

## 2020-09-24 DIAGNOSIS — E119 Type 2 diabetes mellitus without complications: Secondary | ICD-10-CM | POA: Diagnosis not present

## 2020-09-24 NOTE — Progress Notes (Signed)
I,[name],acting as a scribe for [name], MD.,have documented all relevant documentation on the behalf of [name], MD,as directed by  [name], MD while in the presence of [name], MD.   Established patient visit   Patient: Taylor Snyder   DOB: May 23, 1928   87 y.o. Female  MRN: [id number] Visit Date: 09/24/2020  Today's healthcare provider: [name], MD   Chief Complaint  Patient presents with   Follow-up   Diabetes   Hypertension   Hyperlipidemia   Subjective    HPI  Patient overall feels well and has no complaints.  She has had 3 COVID vaccines and no issues.   She has been married for 1 years, she is a mother of 1 and the grandmother of 1 and has 2 great-grandchildren. Diabetes Mellitus Type II, follow-up  Lab Results  Component Value Date   HGBA1C 6.9 (A) 03/27/2020   HGBA1C 7.5 (H) 10/31/2019   HGBA1C 7.3 (A) 06/19/2019   Last seen for diabetes 6 months ago.  Management since then includes; Good control with A1c of 6.9. She reports good compliance with treatment. She is not having side effects. none  Home blood sugar records: fasting range: 134  Episodes of hypoglycemia? No none   Current insulin regiment: n/a Most Recent Eye Exam: 10/16/2019  -----------------------------------------------------------------------------------------------  Hypertension, follow-up  BP Readings from Last 3 Encounters:  09/24/20 131/72  03/27/20 (!) 136/58  12/26/19 (!) 157/55   Wt Readings from Last 3 Encounters:  09/24/20 161 lb (73 kg)  03/27/20 161 lb 6.4 oz (73.2 kg)  12/26/19 160 lb (72.6 kg)     She was last seen for hypertension 6 months ago.  BP at that visit was 136/58. Management since that visit includes; on enalapril. She reports good compliance with treatment. She is not having side effects. none She is not exercising. She is adherent to low salt diet.   Outside blood pressures are not  checking.  She does not smoke.  Use of agents associated with hypertension: none.   --------------------------------------------------------------------------------------------------- Lipid/Cholesterol, follow-up  Last Lipid Panel: Lab Results  Component Value Date   CHOL 139 10/31/2019   LDLCALC 61 10/31/2019   HDL 64 10/31/2019   TRIG 71 10/31/2019    She was last seen for this 11 months ago.  Management since that visit includes; on pravastatin.  She reports good compliance with treatment. She is not having side effects. none  She is following a Regular, Low Sodium diet. Current exercise: none  Last metabolic panel Lab Results  Component Value Date   GLUCOSE 145 (H) 10/31/2019   NA 139 10/31/2019   K 4.4 10/31/2019   BUN 9 10/31/2019   CREATININE 0.64 10/31/2019   GFRNONAA 79 10/31/2019   GFRAA 91 10/31/2019   CALCIUM 8.9 10/31/2019   AST 17 10/31/2019   ALT 10 10/31/2019   The ASCVD Risk score (Goff DC Jr., et al., 2013) failed to calculate for the following reasons:   The 2013 ASCVD risk score is only valid for ages 73 to 64  -----------------------------------------------------------------------------------------------      Medications: Outpatient Medications Prior to Visit  Medication Sig   Accu-Chek FastClix Lancets MISC USE TO CHECK GLUCOSE DAILY AND AS NEEDED   ACCU-CHEK GUIDE test strip USE AS DIRECTED   acetaminophen (TYLENOL) 650 MG CR tablet Take 650 mg by mouth daily as needed for pain.   Blood Glucose Monitoring Suppl (ACCU-CHEK GUIDE) w/Device KIT  1 each by Does not apply route daily.   Calcium Carbonate-Vitamin D 600-400 MG-UNIT per tablet Take 1 tablet by mouth daily.    cetirizine (ZYRTEC) 10 MG tablet Take 10 mg by mouth at bedtime as needed for allergies.    Cholecalciferol (VITAMIN D3) 2000 units TABS Take by mouth.   enalapril (VASOTEC) 5 MG tablet TAKE 1 TABLET BY MOUTH EVERY DAY   FERROCITE 324 MG TABS tablet TAKE 1 TABLET BY MOUTH  EVERY MORNING   fluticasone (FLONASE) 50 MCG/ACT nasal spray USE 2 SPRAYS IN EACH NOSTRIL ONCE DAILY (Patient taking differently: Place 1 spray into both nostrils as needed.)   Fluticasone-Salmeterol (ADVAIR) 100-50 MCG/DOSE AEPB Inhale 1 puff into the lungs 2 (two) times daily as needed.   Glucosamine-Chondroitin (OSTEO BI-FLEX REGULAR STRENGTH PO) Take by mouth daily. Twice a day   metFORMIN (GLUCOPHAGE) 500 MG tablet TAKE 2 TABLETS BY MOUTH EVERY DAY   Multiple Vitamins-Minerals (CENTRUM SILVER) tablet Take 1 tablet by mouth daily.    omeprazole (PRILOSEC) 20 MG capsule TAKE 1 CAPSULE(20 MG) BY MOUTH DAILY   pravastatin (PRAVACHOL) 40 MG tablet TAKE 1 TABLET BY MOUTH EVERYDAY AT BEDTIME   raloxifene (EVISTA) 60 MG tablet TAKE 1 TABLET(60 MG) BY MOUTH DAILY   WIXELA INHUB 100-50 MCG/DOSE AEPB INHALE 1 PUFF INTO THE LUNGS TWICE DAILY   XARELTO 20 MG TABS tablet TAKE 1 TABLET BY MOUTH EVERY DAY   gabapentin (NEURONTIN) 100 MG capsule Take 1 capsule (100 mg total) by mouth 2 (two) times daily. (Patient not taking: Reported on 09/24/2020)   glucose blood (ACCU-CHEK GUIDE) test strip Use as instructed (Patient not taking: No sig reported)   glucose blood test strip Use to check glucose daily and as needed (Patient not taking: No sig reported)   Lancets Misc. (ACCU-CHEK SOFTCLIX LANCET DEV) KIT Dispense 1 kit (Patient not taking: No sig reported)   meloxicam (MOBIC) 7.5 MG tablet TAKE 1 TABLET BY MOUTH EVERY DAY (Patient not taking: No sig reported)   [DISCONTINUED] blood glucose meter kit and supplies Dispense based on patient and insurance preference. Use up to four times daily as directed. (FOR ICD-10 E10.9, E11.9). (Patient not taking: No sig reported)   [DISCONTINUED] Ferrous Fumarate (HEMOCYTE - 106 MG FE) 324 (106 Fe) MG TABS tablet TK 1 T PO QAM (Patient not taking: No sig reported)   [DISCONTINUED] valACYclovir (VALTREX) 1000 MG tablet Take 1 tablet (1,000 mg total) by mouth 3 (three) times  daily. (Patient not taking: No sig reported)   No facility-administered medications prior to visit.    Review of Systems  Constitutional:  Negative for appetite change, chills, fatigue and fever.  Respiratory:  Negative for chest tightness and shortness of breath.   Cardiovascular:  Negative for chest pain and palpitations.  Gastrointestinal:  Negative for abdominal pain, nausea and vomiting.  Neurological:  Negative for dizziness and weakness.      Objective    BP 131/72 (BP Location: Right Arm, Patient Position: Sitting, Cuff Size: Large)   Pulse 80   Resp 18   Ht _0  (1.6 m)   Wt 161 lb (73 kg)   SpO2 95%   BMI 28.52 kg/m     Physical Exam Vitals reviewed.  Constitutional:      Appearance: Normal appearance. She is well-developed and normal weight.  HENT:     Head: Normocephalic and atraumatic.     Right Ear: External ear normal.     Left Ear: External ear normal.  Nose: Nose normal.     Mouth/Throat:     Pharynx: Oropharynx is clear.  Eyes:     General: No scleral icterus.    Conjunctiva/sclera: Conjunctivae normal.  Neck:     Thyroid: No thyromegaly.     Vascular: No carotid bruit.  Cardiovascular:     Rate and Rhythm: Normal rate and regular rhythm.     Heart sounds: Normal heart sounds.  Pulmonary:     Effort: Pulmonary effort is normal.     Breath sounds: Normal breath sounds.  Abdominal:     Palpations: Abdomen is soft.  Musculoskeletal:     Right lower leg: Edema present.     Left lower leg: Edema present.     Comments: Trace lower extremity edema and lymphedema both breasts  Skin:    General: Skin is warm and dry.  Neurological:     General: No focal deficit present.     Mental Status: She is alert and oriented to person, place, and time.  Psychiatric:        Mood and Affect: Mood normal.        Behavior: Behavior normal.        Thought Content: Thought content normal.        Judgment: Judgment normal.      No results found for any  visits on 09/24/20.  Assessment & Plan     1. Type 2 diabetes mellitus without complication, without long-term current use of insulin (HCC) On metformin 1000 mg daily - Lipid panel - TSH - CBC w/Diff/Platelet - Comprehensive Metabolic Panel (CMET) - Hemoglobin A1c  2. Benign essential HTN  - Lipid panel - TSH - CBC w/Diff/Platelet - Comprehensive Metabolic Panel (CMET) - Hemoglobin A1c  3. Pure hypercholesterolemia On pravastatin - Lipid panel - TSH - CBC w/Diff/Platelet - Comprehensive Metabolic Panel (CMET) - Hemoglobin A1c  4. Persistent atrial fibrillation (HCC) On Xarelto - Lipid panel - TSH - CBC w/Diff/Platelet - Comprehensive Metabolic Panel (CMET) - Hemoglobin A1c    -   Return in about 6 months (around 03/27/2021).      I, [name], MD, have reviewed all documentation for this visit. The documentation on 09/30/20 for the exam, diagnosis, procedures, and orders are all accurate and complete.    Anai Lipson Cranford Mon, MD  [facility] [phone] (phone) [phone] (fax)  [place]

## 2020-09-24 NOTE — Patient Instructions (Signed)
Get Covid Vaccine and Than Shingles.

## 2020-09-25 LAB — CBC WITH DIFFERENTIAL/PLATELET
Basophils Absolute: 0 10*3/uL (ref 0.0–0.2)
Basos: 1 %
EOS (ABSOLUTE): 0.1 10*3/uL (ref 0.0–0.4)
Eos: 1 %
Hematocrit: 38.3 % (ref 34.0–46.6)
Hemoglobin: 13.1 g/dL (ref 11.1–15.9)
Immature Grans (Abs): 0 10*3/uL (ref 0.0–0.1)
Immature Granulocytes: 0 %
Lymphocytes Absolute: 2 10*3/uL (ref 0.7–3.1)
Lymphs: 32 %
MCH: 30.8 pg (ref 26.6–33.0)
MCHC: 34.2 g/dL (ref 31.5–35.7)
MCV: 90 fL (ref 79–97)
Monocytes Absolute: 0.7 10*3/uL (ref 0.1–0.9)
Monocytes: 10 %
Neutrophils Absolute: 3.6 10*3/uL (ref 1.4–7.0)
Neutrophils: 56 %
Platelets: 207 10*3/uL (ref 150–450)
RBC: 4.25 x10E6/uL (ref 3.77–5.28)
RDW: 12.6 % (ref 11.7–15.4)
WBC: 6.4 10*3/uL (ref 3.4–10.8)

## 2020-09-25 LAB — COMPREHENSIVE METABOLIC PANEL
ALT: 10 IU/L (ref 0–32)
AST: 15 IU/L (ref 0–40)
Albumin/Globulin Ratio: 1.9 (ref 1.2–2.2)
Albumin: 4.1 g/dL (ref 3.5–4.6)
Alkaline Phosphatase: 89 IU/L (ref 44–121)
BUN/Creatinine Ratio: 17 (ref 12–28)
BUN: 13 mg/dL (ref 10–36)
Bilirubin Total: 0.3 mg/dL (ref 0.0–1.2)
CO2: 23 mmol/L (ref 20–29)
Calcium: 9 mg/dL (ref 8.7–10.3)
Chloride: 97 mmol/L (ref 96–106)
Creatinine, Ser: 0.77 mg/dL (ref 0.57–1.00)
Globulin, Total: 2.2 g/dL (ref 1.5–4.5)
Glucose: 161 mg/dL — ABNORMAL HIGH (ref 65–99)
Potassium: 4.2 mmol/L (ref 3.5–5.2)
Sodium: 137 mmol/L (ref 134–144)
Total Protein: 6.3 g/dL (ref 6.0–8.5)
eGFR: 73 mL/min/{1.73_m2} (ref >59)

## 2020-09-25 LAB — LIPID PANEL
Chol/HDL Ratio: 2 ratio (ref 0.0–4.4)
Cholesterol, Total: 154 mg/dL (ref 100–199)
HDL: 76 mg/dL (ref >39)
LDL Chol Calc (NIH): 65 mg/dL (ref 0–99)
Triglycerides: 64 mg/dL (ref 0–149)
VLDL Cholesterol Cal: 13 mg/dL (ref 5–40)

## 2020-09-25 LAB — HEMOGLOBIN A1C
Est. average glucose Bld gHb Est-mCnc: 169 mg/dL
Hgb A1c MFr Bld: 7.5 % — ABNORMAL HIGH (ref 4.8–5.6)

## 2020-09-25 LAB — TSH: TSH: 1.8 u[IU]/mL (ref 0.450–4.500)

## 2020-10-07 DIAGNOSIS — Z23 Encounter for immunization: Secondary | ICD-10-CM | POA: Diagnosis not present

## 2020-10-08 ENCOUNTER — Other Ambulatory Visit: Payer: Self-pay | Admitting: Family Medicine

## 2020-10-08 DIAGNOSIS — E7800 Pure hypercholesterolemia, unspecified: Secondary | ICD-10-CM

## 2020-10-08 NOTE — Telephone Encounter (Signed)
Valid encounter. No future visit at this time

## 2020-10-15 ENCOUNTER — Other Ambulatory Visit: Payer: Self-pay | Admitting: Family Medicine

## 2020-10-15 DIAGNOSIS — M1990 Unspecified osteoarthritis, unspecified site: Secondary | ICD-10-CM

## 2020-10-15 NOTE — Telephone Encounter (Signed)
Requested medication (s) are due for refill today: Yes  Requested medication (s) are on the active medication list: Yes  Last refill:  09/13/19  Future visit scheduled: Yes  Notes to clinic:  Unable to refill per protocol, Rx expired.      Requested Prescriptions  Pending Prescriptions Disp Refills   omeprazole (PRILOSEC) 20 MG capsule [Pharmacy Med Name: OMEPRAZOLE '20MG'$  CAPSULES] 90 capsule 3    Sig: TAKE 1 CAPSULE BY MOUTH EVERY DAY     Gastroenterology: Proton Pump Inhibitors Passed - 10/15/2020 12:57 PM      Passed - Valid encounter within last 12 months    Recent Outpatient Visits           3 weeks ago Type 2 diabetes mellitus without complication, without long-term current use of insulin [facility])   [facility] Jerrol Banana., [place]   6 months ago Type 2 diabetes mellitus without complication, without long-term current use of insulin [facility])   [facility] Jerrol Banana., [facility]   9 months ago Pain and swelling of left ankle   [place], Vickki Muff, [place]   11 months ago Herpes zoster without complication   [facility] Jerrol Banana., MD   11 months ago Type 2 diabetes mellitus without complication, without long-term current use of insulin [facility])   [facility] Jerrol Banana., MD

## 2020-10-16 DIAGNOSIS — E118 Type 2 diabetes mellitus with unspecified complications: Secondary | ICD-10-CM | POA: Diagnosis not present

## 2020-10-16 DIAGNOSIS — H26492 Other secondary cataract, left eye: Secondary | ICD-10-CM | POA: Diagnosis not present

## 2020-10-16 LAB — HM DIABETES EYE EXAM

## 2020-11-15 DIAGNOSIS — E118 Type 2 diabetes mellitus with unspecified complications: Secondary | ICD-10-CM | POA: Diagnosis not present

## 2020-12-03 DIAGNOSIS — Z23 Encounter for immunization: Secondary | ICD-10-CM | POA: Diagnosis not present

## 2020-12-09 DIAGNOSIS — B351 Tinea unguium: Secondary | ICD-10-CM | POA: Diagnosis not present

## 2020-12-09 DIAGNOSIS — E1142 Type 2 diabetes mellitus with diabetic polyneuropathy: Secondary | ICD-10-CM | POA: Diagnosis not present

## 2020-12-15 ENCOUNTER — Other Ambulatory Visit: Payer: Self-pay | Admitting: Family Medicine

## 2020-12-15 DIAGNOSIS — M810 Age-related osteoporosis without current pathological fracture: Secondary | ICD-10-CM

## 2020-12-15 NOTE — Telephone Encounter (Signed)
Requested Prescriptions  Pending Prescriptions Disp Refills  . raloxifene (EVISTA) 60 MG tablet [Pharmacy Med Name: RALOXIFENE 60MG  TABLETS] 90 tablet 0    Sig: TAKE 1 TABLET(60 MG) BY MOUTH DAILY     OB/GYN:  Selective Estrogen Receptor Modulators Passed - 12/15/2020  1:51 PM      Passed - Valid encounter within last 12 months    Recent Outpatient Visits          2 months ago Type 2 diabetes mellitus without complication, without long-term current use of insulin [facility])   [facility] Jerrol Banana., MD   8 months ago Type 2 diabetes mellitus without complication, without long-term current use of insulin [facility])   [facility] Jerrol Banana., MD   11 months ago Pain and swelling of left ankle   [place], Vickki Muff, [place]   1 year ago Herpes zoster without complication   [facility] Jerrol Banana., MD   1 year ago Type 2 diabetes mellitus without complication, without long-term current use of insulin [facility])   [facility] Jerrol Banana., MD

## 2020-12-16 ENCOUNTER — Other Ambulatory Visit: Payer: Self-pay | Admitting: Family Medicine

## 2020-12-16 DIAGNOSIS — I10 Essential (primary) hypertension: Secondary | ICD-10-CM

## 2020-12-16 DIAGNOSIS — E118 Type 2 diabetes mellitus with unspecified complications: Secondary | ICD-10-CM | POA: Diagnosis not present

## 2020-12-16 NOTE — Telephone Encounter (Signed)
Requested Prescriptions  Pending Prescriptions Disp Refills  . enalapril (VASOTEC) 5 MG tablet [Pharmacy Med Name: ENALAPRIL 5MG  TABLETS] 90 tablet 1    Sig: TAKE 1 TABLET BY MOUTH EVERY DAY     Cardiovascular:  ACE Inhibitors Passed - 12/16/2020  2:47 PM      Passed - Cr in normal range and within 180 days    Creatinine, Ser  Date Value Ref Range Status  09/24/2020 0.77 0.57 - 1.00 mg/dL Final         Passed - K in normal range and within 180 days    Potassium  Date Value Ref Range Status  09/24/2020 4.2 3.5 - 5.2 mmol/L Final         Passed - Patient is not pregnant      Passed - Last BP in normal range    BP Readings from Last 1 Encounters:  09/24/20 131/72         Passed - Valid encounter within last 6 months    Recent Outpatient Visits          2 months ago Type 2 diabetes mellitus without complication, without long-term current use of insulin [facility])   [facility] Jerrol Banana., MD   8 months ago Type 2 diabetes mellitus without complication, without long-term current use of insulin [facility])   [facility] Jerrol Banana., MD   11 months ago Pain and swelling of left ankle   [place], Vickki Muff, [place]   1 year ago Herpes zoster without complication   [facility] Jerrol Banana., MD   1 year ago Type 2 diabetes mellitus without complication, without long-term current use of insulin [facility])   [facility] Jerrol Banana., MD

## 2021-01-15 ENCOUNTER — Other Ambulatory Visit: Payer: Self-pay | Admitting: Family Medicine

## 2021-01-23 DIAGNOSIS — Z20822 Contact with and (suspected) exposure to covid-19: Secondary | ICD-10-CM | POA: Diagnosis not present

## 2021-01-28 DIAGNOSIS — I495 Sick sinus syndrome: Secondary | ICD-10-CM | POA: Diagnosis not present

## 2021-02-02 ENCOUNTER — Emergency Department
Admission: EM | Admit: 2021-02-02 | Discharge: 2021-02-02 | Disposition: A | Discharge status: Home / Self Care | Payer: Medicare Other | Attending: Emergency Medicine | Admitting: Emergency Medicine

## 2021-02-02 ENCOUNTER — Encounter: Payer: Self-pay | Admitting: Emergency Medicine

## 2021-02-02 ENCOUNTER — Emergency Department: Payer: Medicare Other

## 2021-02-02 ENCOUNTER — Other Ambulatory Visit: Payer: Self-pay

## 2021-02-02 DIAGNOSIS — Z7984 Long term (current) use of oral hypoglycemic drugs: Secondary | ICD-10-CM | POA: Insufficient documentation

## 2021-02-02 DIAGNOSIS — Z950 Presence of cardiac pacemaker: Secondary | ICD-10-CM | POA: Diagnosis not present

## 2021-02-02 DIAGNOSIS — S0990XA Unspecified injury of head, initial encounter: Secondary | ICD-10-CM | POA: Diagnosis present

## 2021-02-02 DIAGNOSIS — E119 Type 2 diabetes mellitus without complications: Secondary | ICD-10-CM | POA: Insufficient documentation

## 2021-02-02 DIAGNOSIS — W01198A Fall on same level from slipping, tripping and stumbling with subsequent striking against other object, initial encounter: Secondary | ICD-10-CM | POA: Diagnosis not present

## 2021-02-02 DIAGNOSIS — J45909 Unspecified asthma, uncomplicated: Secondary | ICD-10-CM | POA: Insufficient documentation

## 2021-02-02 DIAGNOSIS — I10 Essential (primary) hypertension: Secondary | ICD-10-CM | POA: Insufficient documentation

## 2021-02-02 DIAGNOSIS — W19XXXA Unspecified fall, initial encounter: Secondary | ICD-10-CM | POA: Diagnosis not present

## 2021-02-02 DIAGNOSIS — S0181XA Laceration without foreign body of other part of head, initial encounter: Secondary | ICD-10-CM | POA: Diagnosis not present

## 2021-02-02 DIAGNOSIS — M7989 Other specified soft tissue disorders: Secondary | ICD-10-CM | POA: Diagnosis not present

## 2021-02-02 DIAGNOSIS — S0101XA Laceration without foreign body of scalp, initial encounter: Secondary | ICD-10-CM | POA: Diagnosis not present

## 2021-02-02 LAB — CBC WITH DIFFERENTIAL/PLATELET
Abs Immature Granulocytes: 0.02 10*3/uL (ref 0.00–0.07)
Basophils Absolute: 0 10*3/uL (ref 0.0–0.1)
Basophils Relative: 0 %
Eosinophils Absolute: 0 10*3/uL (ref 0.0–0.5)
Eosinophils Relative: 0 %
HCT: 40.5 % (ref 36.0–46.0)
Hemoglobin: 13.1 g/dL (ref 12.0–15.0)
Immature Granulocytes: 0 %
Lymphocytes Relative: 26 %
Lymphs Abs: 2.6 10*3/uL (ref 0.7–4.0)
MCH: 31.1 pg (ref 26.0–34.0)
MCHC: 32.3 g/dL (ref 30.0–36.0)
MCV: 96.2 fL (ref 80.0–100.0)
Monocytes Absolute: 0.9 10*3/uL (ref 0.1–1.0)
Monocytes Relative: 9 %
Neutro Abs: 6.6 10*3/uL (ref 1.7–7.7)
Neutrophils Relative %: 65 %
Platelets: 205 10*3/uL (ref 150–400)
RBC: 4.21 MIL/uL (ref 3.87–5.11)
RDW: 13.4 % (ref 11.5–15.5)
WBC: 10.2 10*3/uL (ref 4.0–10.5)
nRBC: 0 % (ref 0.0–0.2)

## 2021-02-02 LAB — COMPREHENSIVE METABOLIC PANEL
ALT: 16 U/L (ref 0–44)
AST: 23 U/L (ref 15–41)
Albumin: 4 g/dL (ref 3.5–5.0)
Alkaline Phosphatase: 81 U/L (ref 38–126)
Anion gap: 7 (ref 5–15)
BUN: 14 mg/dL (ref 8–23)
CO2: 27 mmol/L (ref 22–32)
Calcium: 8.8 mg/dL — ABNORMAL LOW (ref 8.9–10.3)
Chloride: 100 mmol/L (ref 98–111)
Creatinine, Ser: 0.68 mg/dL (ref 0.44–1.00)
GFR, Estimated: >60 mL/min (ref >60)
Glucose, Bld: 189 mg/dL — ABNORMAL HIGH (ref 70–99)
Potassium: 4.1 mmol/L (ref 3.5–5.1)
Sodium: 134 mmol/L — ABNORMAL LOW (ref 135–145)
Total Bilirubin: 0.6 mg/dL (ref 0.3–1.2)
Total Protein: 6.9 g/dL (ref 6.5–8.1)

## 2021-02-02 NOTE — ED Provider Notes (Signed)
°  Emergency Medicine Provider Triage Evaluation Note  Taylor Snyder , a 87 y.o.female,  was evaluated in triage.  Pt complains of head injury.  Patient states that she fell after a rug came out from under her and hit her head.  She has a laceration to the right side of her head.  Denies LOC.  Denies chest pain, headache, shortness of breath, abdominal pain, or urinary symptoms.   Review of Systems  Positive: Head injury Negative: Denies fever, chest pain, vomiting  Physical Exam   Vitals:   02/02/21 1559 02/02/21 1602  BP:  (!) 181/77  Pulse: 97   Resp: 20   Temp:  98 F (36.7 C)  SpO2: 97%    Gen:   Awake, no distress   Resp:  Normal effort  MSK:   Moves extremities without difficulty  Other:    Medical Decision Making  Given the patient's initial medical screening exam, the following diagnostic evaluation has been ordered. The patient will be placed in the appropriate treatment space, once one is available, to complete the evaluation and treatment. I have discussed the plan of care with the patient and I have advised the patient that an ED physician or mid-level practitioner will reevaluate their condition after the test results have been received, as the results may give them additional insight into the type of treatment they may need.    Diagnostics: Head CT, cervical spine CT  Treatments: none immediately   Teodoro Spray, PA 02/02/21 1604    Lucrezia Starch, MD 02/02/21 [phone]

## 2021-02-02 NOTE — ED Provider Notes (Signed)
[facility]-EMERGENCY DEPARTMENT  ____________________________________________  Time seen: Approximately 6:18 PM  I have reviewed the triage vital signs and the nursing notes.   HISTORY  Chief Complaint Fall   Historian Patient     HPI Taylor Snyder is a 87 y.o. female presents to the emergency department after patient tripped on her shoe.  Daughter reports that the phone was ringing and patient was trying to move too quickly and lost her balance and fell.  She did not lose consciousness.  She has a 4-1/2 cm left-sided parietal scalp laceration.  Patient is currently anticoagulated with Xarelto for A. fib.  Tetanus status is up-to-date.  No chest pain, chest tightness or abdominal pain.   Past Medical History:  Diagnosis Date   A-fib (Taylor Snyder)    Anemia    Asthma    Colon polyps 2005   Diabetes mellitus without complication (Taylor Snyder) 1062   Dysrhythmia    GERD (gastroesophageal reflux disease)    HOH (hard of hearing)    AIDS   Hyperlipidemia    Hypertension    Obesity    Presence of permanent cardiac pacemaker    Rheumatic mitral valve failure    Unspecified atrial fibrillation (Taylor Snyder)      Immunizations up to date:  Yes.     Past Medical History:  Diagnosis Date   A-fib (Taylor Snyder)    Anemia    Asthma    Colon polyps 2005   Diabetes mellitus without complication (Taylor Snyder) 6948   Dysrhythmia    GERD (gastroesophageal reflux disease)    HOH (hard of hearing)    AIDS   Hyperlipidemia    Hypertension    Obesity    Presence of permanent cardiac pacemaker    Rheumatic mitral valve failure    Unspecified atrial fibrillation [name])     Patient Active Problem List   Diagnosis Date Noted   A-fib (Taylor Snyder) 06/18/2014   Allergic rhinitis 06/18/2014   Airway hyperreactivity 06/18/2014   CAFL (chronic airflow limitation) ([place]) 06/18/2014   Diabetes mellitus, type 2 (Taylor Snyder) 06/18/2014   HLD (hyperlipidemia) 06/18/2014   BP (high blood pressure) 06/18/2014   History of prolonged Q-T  interval on ECG 06/18/2014   Mitral valve disorder 06/18/2014   Arthritis, degenerative 06/18/2014   Adiposity 06/18/2014   OP (osteoporosis) 06/18/2014   Benign essential HTN 06/08/2014   Anemia 12/28/2013   Combined fat and carbohydrate induced hyperlipemia 12/28/2013   MI (mitral incompetence) 12/28/2013   Sick sinus syndrome (Taylor Snyder) 12/28/2013   Anemia, iron deficiency 10/18/2013   Personal history of colonic polyps 08/29/2012   Colon polyps     Past Surgical History:  Procedure Laterality Date   ABDOMINAL HYSTERECTOMY     APPENDECTOMY     CATARACT EXTRACTION W/PHACO Right 09/29/2016   Procedure: CATARACT EXTRACTION PHACO AND INTRAOCULAR LENS PLACEMENT ([place]);  Surgeon: Birder Robson, MD;  Location: [facility] ORS;  Service: Ophthalmology;  Laterality: Right;  [place] 00:46 AP% 18.9 CDE 8.69 Fluid pack lot # [id number]   CATARACT EXTRACTION W/PHACO Left 10/27/2016   Procedure: CATARACT EXTRACTION PHACO AND INTRAOCULAR LENS PLACEMENT ([facility]);  Surgeon: Birder Robson, MD;  Location: [facility] ORS;  Service: Ophthalmology;  Laterality: Left;  [place] 00:49 AP% 22.4 CDE 11.03 Fluid pack lot # [id number]   CHOLECYSTECTOMY  2008   COLONOSCOPY  2005, 2014   Dr Bary Castilla   FLEXIBLE SIGMOIDOSCOPY  2006   INSERT / REPLACE / REMOVE PACEMAKER     PACEMAKER INSERTION Left 08/21/2016   Procedure: PACEMAKER CHANGE OUT;  Surgeon: Marzetta Board, MD;  Location: [facility] ORS;  Service: Cardiovascular;  Laterality: Left;   PACEMAKER PLACEMENT  2008   salpingo oophorectmy      TONSILLECTOMY      Prior to Admission medications   Medication Sig Start Date End Date Taking? Authorizing Provider  Accu-Chek FastClix Lancets MISC USE TO CHECK GLUCOSE DAILY AND AS NEEDED 05/15/19   Jerrol Banana., MD  ACCU-CHEK GUIDE test strip USE AS DIRECTED 12/18/19   Jerrol Banana., MD  acetaminophen (TYLENOL) 650 MG CR tablet Take 650 mg by mouth daily as needed for pain.    [provider]  Blood Glucose  Monitoring Suppl (ACCU-CHEK GUIDE) w/Device KIT 1 each by Does not apply route daily. 03/17/18   Jerrol Banana., MD  Calcium Carbonate-Vitamin D 600-400 MG-UNIT per tablet Take 1 tablet by mouth daily.     [provider]  cetirizine (ZYRTEC) 10 MG tablet Take 10 mg by mouth at bedtime as needed for allergies.     [provider]  Cholecalciferol (VITAMIN D3) 2000 units TABS Take by mouth.    [provider]  enalapril (VASOTEC) 5 MG tablet TAKE 1 TABLET BY MOUTH EVERY DAY 12/16/20   Jerrol Banana., MD  FERROCITE 324 MG TABS tablet TAKE 1 TABLET BY MOUTH EVERY MORNING 12/18/19   Jerrol Banana., MD  fluticasone [facility]) 50 MCG/ACT nasal spray USE 2 SPRAYS IN EACH NOSTRIL ONCE DAILY Patient taking differently: Place 1 spray into both nostrils as needed. 12/06/14   Jerrol Banana., MD  Fluticasone-Salmeterol (ADVAIR) 100-50 MCG/DOSE AEPB Inhale 1 puff into the lungs 2 (two) times daily as needed.    [provider]  gabapentin (NEURONTIN) 100 MG capsule Take 1 capsule (100 mg total) by mouth 2 (two) times daily. Patient not taking: Reported on 09/24/2020 11/16/19   Jerrol Banana., MD  Glucosamine-Chondroitin (OSTEO BI-FLEX REGULAR STRENGTH PO) Take by mouth daily. Twice a day    [provider]  glucose blood (ACCU-CHEK GUIDE) test strip Use as instructed Patient not taking: No sig reported 03/17/18   Jerrol Banana., MD  glucose blood test strip Use to check glucose daily and as needed Patient not taking: No sig reported 07/22/18   Jerrol Banana., MD  Lancets Misc. (ACCU-CHEK SOFTCLIX LANCET DEV) KIT Dispense 1 kit Patient not taking: No sig reported 07/22/18   Jerrol Banana., MD  meloxicam (MOBIC) 7.5 MG tablet TAKE 1 TABLET BY MOUTH EVERY DAY Patient not taking: No sig reported 02/20/20   Jerrol Banana., MD  metFORMIN (GLUCOPHAGE) 500 MG tablet TAKE 2 TABLETS BY MOUTH EVERY DAY 01/15/21    Jerrol Banana., MD  Multiple Vitamins-Minerals (CENTRUM SILVER) tablet Take 1 tablet by mouth daily.     [provider]  omeprazole (PRILOSEC) 20 MG capsule TAKE 1 CAPSULE BY MOUTH EVERY DAY 10/15/20   Jerrol Banana., MD  pravastatin (PRAVACHOL) 40 MG tablet TAKE 1 TABLET BY MOUTH EVERY DAY AT BEDTIME 10/08/20   Jerrol Banana., MD  raloxifene (EVISTA) 60 MG tablet TAKE 1 TABLET(60 MG) BY MOUTH DAILY 12/15/20   Jerrol Banana., MD  [place] Ruts INHUB 100-50 MCG/DOSE AEPB INHALE 1 PUFF INTO THE LUNGS TWICE DAILY 03/15/20   Jerrol Banana., MD  XARELTO 20 MG TABS tablet TAKE 1 TABLET BY MOUTH EVERY DAY 01/15/21   Eulas Post  Brooke Bonito., MD    Allergies Codeine sulfate and Codeine  Family History  Problem Relation Age of Onset   Hypertension Mother    Stroke Father    Heart disease Brother    Alcohol abuse Brother     Social History Social History   Tobacco Use   Smoking status: Never   Smokeless tobacco: Never  Vaping Use   Vaping Use: Never used  Substance Use Topics   Alcohol use: No   Drug use: No     Review of Systems  Constitutional: No fever/chills Eyes:  No discharge ENT: No upper respiratory complaints. Respiratory: no cough. No SOB/ use of accessory muscles to breath Gastrointestinal:   No nausea, no vomiting.  No diarrhea.  No constipation. Musculoskeletal: Negative for musculoskeletal pain. Skin: Patient has scalp laceration.     ____________________________________________   PHYSICAL EXAM:  VITAL SIGNS: ED Triage Vitals  Enc Vitals Group     BP 02/02/21 1602 (!) 181/77     Pulse Rate 02/02/21 1559 97     Resp 02/02/21 1559 20     Temp 02/02/21 1602 98 F (36.7 C)     Temp Source 02/02/21 1559 Oral     SpO2 02/02/21 1559 97 %     Weight 02/02/21 1601 155 lb (70.3 kg)     Height 02/02/21 1601 '5\' 3"'  (1.6 m)     Head Circumference --      Peak Flow --      Pain Score 02/02/21 1601 0     Pain Loc --      Pain  Edu? --      Excl. in [place]? --      Constitutional: Alert and oriented. Well appearing and in no acute distress. Eyes: Conjunctivae are normal. PERRL. EOMI. Head: Atraumatic. ENT:      Nose: No congestion/rhinnorhea.      Mouth/Throat: Mucous membranes are moist.  Neck: No stridor.  No cervical spine tenderness to palpation. Cardiovascular: Normal rate, regular rhythm. Normal S1 and S2.  Good peripheral circulation. Respiratory: Normal respiratory effort without tachypnea or retractions. Lungs CTAB. Good air entry to the bases with no decreased or absent breath sounds Gastrointestinal: Bowel sounds x 4 quadrants. Soft and nontender to palpation. No guarding or rigidity. No distention. Musculoskeletal: Full range of motion to all extremities. No obvious deformities noted Neurologic:  Normal for age. No gross focal neurologic deficits are appreciated.  Skin: Patient has a 4-1/2 cm linear scalp laceration laceration deep to dermis. Psychiatric: Mood and affect are normal for age. Speech and behavior are normal.   ____________________________________________   LABS (all labs ordered are listed, but only abnormal results are displayed)  Labs Reviewed  COMPREHENSIVE METABOLIC PANEL - Abnormal; Notable for the following components:      Result Value   Sodium 134 (*)    Glucose, Bld 189 (*)    Calcium 8.8 (*)    All other components within normal limits  CBC WITH DIFFERENTIAL/PLATELET   ____________________________________________  EKG   ____________________________________________  RADIOLOGY Unk Pinto, personally viewed and evaluated these images (plain radiographs) as part of my medical decision making, as well as reviewing the written report by the radiologist.  CT Head Wo Contrast  Result Date: 02/02/2021 CLINICAL DATA:  Trauma, fall, laceration to the right side of the head EXAM: CT HEAD WITHOUT CONTRAST CT CERVICAL SPINE WITHOUT CONTRAST TECHNIQUE: Multidetector CT  imaging of the head and cervical spine was performed following the standard protocol  without intravenous contrast. Multiplanar CT image reconstructions of the cervical spine were also generated. COMPARISON:  None. FINDINGS: CT HEAD FINDINGS Brain: No evidence of acute infarction, hemorrhage, hydrocephalus, extra-axial collection or mass lesion/mass effect. Mild chronic small-vessel ischemic changes and cerebral atrophy. Vascular: No hyperdense vessel or unexpected calcification. Skull: Right frontal laceration with soft tissue swelling. Soiled gauze around the head. Sinuses/Orbits: No acute finding.  Bilateral cataract surgery. Other: None. CT CERVICAL SPINE FINDINGS Alignment: Straightening of the cervical spine. Skull base and vertebrae: No acute fracture. No primary bone lesion or focal pathologic process. Soft tissues and spinal canal: No prevertebral fluid or swelling. No visible canal hematoma. Disc levels: Mild multilevel degenerative disc disease. No significant spinal canal or neural foraminal narrowing. Upper chest: Negative. Other: None IMPRESSION: 1.  No acute intracranial abnormality. 2. Right frontal/parietal soft tissue hematoma without evidence of calvarial fracture. 3.  No evidence of fracture or subluxation of the cervical spine. 4.  Mild multilevel degenerative disc disease of the cervical spine. Electronically Signed   By: Keane Police D.O.   On: 02/02/2021 16:49   CT Cervical Spine Wo Contrast  Result Date: 02/02/2021 CLINICAL DATA:  Trauma, fall, laceration to the right side of the head EXAM: CT HEAD WITHOUT CONTRAST CT CERVICAL SPINE WITHOUT CONTRAST TECHNIQUE: Multidetector CT imaging of the head and cervical spine was performed following the standard protocol without intravenous contrast. Multiplanar CT image reconstructions of the cervical spine were also generated. COMPARISON:  None. FINDINGS: CT HEAD FINDINGS Brain: No evidence of acute infarction, hemorrhage, hydrocephalus,  extra-axial collection or mass lesion/mass effect. Mild chronic small-vessel ischemic changes and cerebral atrophy. Vascular: No hyperdense vessel or unexpected calcification. Skull: Right frontal laceration with soft tissue swelling. Soiled gauze around the head. Sinuses/Orbits: No acute finding.  Bilateral cataract surgery. Other: None. CT CERVICAL SPINE FINDINGS Alignment: Straightening of the cervical spine. Skull base and vertebrae: No acute fracture. No primary bone lesion or focal pathologic process. Soft tissues and spinal canal: No prevertebral fluid or swelling. No visible canal hematoma. Disc levels: Mild multilevel degenerative disc disease. No significant spinal canal or neural foraminal narrowing. Upper chest: Negative. Other: None IMPRESSION: 1.  No acute intracranial abnormality. 2. Right frontal/parietal soft tissue hematoma without evidence of calvarial fracture. 3.  No evidence of fracture or subluxation of the cervical spine. 4.  Mild multilevel degenerative disc disease of the cervical spine. Electronically Signed   By: Keane Police D.O.   On: 02/02/2021 16:49    ____________________________________________    PROCEDURES  Procedure(s) performed:     Marland KitchenMarland KitchenLaceration Repair  Date/Time: 02/02/2021 6:21 PM Performed by: Lannie Fields, PA-C Authorized by: Lannie Fields, PA-C   Consent:    Consent obtained:  Verbal   Risks discussed:  Infection and pain Universal protocol:    Procedure explained and questions answered to patient or proxy's satisfaction: yes     Patient identity confirmed:  Verbally with patient Anesthesia:    Anesthesia method:  Local infiltration   Local anesthetic:  Lidocaine 1% WITH epi Laceration details:    Location:  Scalp   Length (cm):  4.5   Depth (mm):  5 Pre-procedure details:    Preparation:  Patient was prepped and draped in usual sterile fashion Exploration:    Contaminated: no   Treatment:    Area cleansed with:  Povidone-iodine    Amount of cleaning:  Standard   Irrigation volume:  500   Debridement:  Minimal Skin repair:    Repair  method:  Staples   Number of staples:  15 Repair type:    Repair type:  Intermediate Post-procedure details:    Dressing:  Non-adherent dressing     Medications - No data to display   ____________________________________________   INITIAL IMPRESSION / ASSESSMENT AND PLAN / ED COURSE  Pertinent labs & imaging results that were available during my care of the patient were reviewed by me and considered in my medical decision making (see chart for details).      Assessment and plan:  Scalp laceration:  20-year-old female presents to the emergency department with a right-sided parietal laceration repaired in the emergency department using staples.  CT showed no evidence of intracranial bleed, skull fracture or C-spine fracture.  Hemoglobin and hematocrit were reassuring.  Patient was advised to have staples removed by primary care in 5 to 6 days.  Patient reports that she will contact her PCP for tetanus shot.    ____________________________________________  FINAL CLINICAL IMPRESSION(S) / ED DIAGNOSES  Final diagnoses:  Laceration of scalp, initial encounter      NEW MEDICATIONS STARTED DURING THIS VISIT:  ED Discharge Orders     None           This chart was dictated using voice recognition software/Dragon. Despite best efforts to proofread, errors can occur which can change the meaning. Any change was purely unintentional.     Lannie Fields, PA-C 02/02/21 1935    Lucrezia Starch, MD 02/02/21 [phone]

## 2021-02-02 NOTE — Discharge Instructions (Addendum)
Keep wound clean for the next 24 hours. You can shampoo the hair tomorrow. Have staples removed in 5 to 6 days.

## 2021-02-02 NOTE — ED Triage Notes (Signed)
Pt in via EMS from home with c/o fall. EMS reports no LOC, pt with laceration to right side of head, bandage in place. Pt takes xarelto. Fall was witnessed. 192/80, 97% RA, HR 110

## 2021-02-02 NOTE — ED Triage Notes (Signed)
Pt via EMS from home. Pt c/o fall, mechanical fall, pt states she tripped on the carpet. Only pain she has is the R leg. Pt did hit her head and is on blood thinners. Pt has a lac to the R anterior aspect of the head. Bleeding controlled. Pt is A&OX4 and NAD.

## 2021-02-06 NOTE — Progress Notes (Signed)
Established patient visit   Patient: Taylor Snyder   DOB: 17-Jul-1928   87 y.o. Female  MRN: [id number] Visit Date: 02/07/2021  Today's healthcare provider: [name], PA-C   Chief Complaint  Patient presents with   Hospitalization Follow-up   Subjective    HPI  Follow up Hospitalization  Patient was admitted to [facility] on 02/02/21 and discharged on 02/02/21. She was treated for Fall; laceration of scalp. Treatment for this included staples, neg head CT She reports this condition is improved. The fall was mechanical, pt was immediately taken care of. No down time.  Denies headaches,blurry vision today. Denies changes or difficulty in ambulation.    Medications: Outpatient Medications Prior to Visit  Medication Sig   Accu-Chek FastClix Lancets MISC USE TO CHECK GLUCOSE DAILY AND AS NEEDED   ACCU-CHEK GUIDE test strip USE AS DIRECTED   acetaminophen (TYLENOL) 650 MG CR tablet Take 650 mg by mouth daily as needed for pain.   Blood Glucose Monitoring Suppl (ACCU-CHEK GUIDE) w/Device KIT 1 each by Does not apply route daily.   Calcium Carbonate-Vitamin D 600-400 MG-UNIT per tablet Take 1 tablet by mouth daily.    cetirizine (ZYRTEC) 10 MG tablet Take 10 mg by mouth at bedtime as needed for allergies.    Cholecalciferol (VITAMIN D3) 2000 units TABS Take by mouth.   enalapril (VASOTEC) 5 MG tablet TAKE 1 TABLET BY MOUTH EVERY DAY   FERROCITE 324 MG TABS tablet TAKE 1 TABLET BY MOUTH EVERY MORNING   fluticasone (FLONASE) 50 MCG/ACT nasal spray USE 2 SPRAYS IN EACH NOSTRIL ONCE DAILY (Patient taking differently: Place 1 spray into both nostrils as needed.)   Fluticasone-Salmeterol (ADVAIR) 100-50 MCG/DOSE AEPB Inhale 1 puff into the lungs 2 (two) times daily as needed.   gabapentin (NEURONTIN) 100 MG capsule Take 1 capsule (100 mg total) by mouth 2 (two) times daily.   Glucosamine-Chondroitin (OSTEO BI-FLEX REGULAR STRENGTH PO) Take by mouth daily. Twice a day   glucose blood  (ACCU-CHEK GUIDE) test strip Use as instructed   glucose blood test strip Use to check glucose daily and as needed   Lancets Misc. (ACCU-CHEK SOFTCLIX LANCET DEV) KIT Dispense 1 kit   meloxicam (MOBIC) 7.5 MG tablet TAKE 1 TABLET BY MOUTH EVERY DAY   metFORMIN (GLUCOPHAGE) 500 MG tablet TAKE 2 TABLETS BY MOUTH EVERY DAY   Multiple Vitamins-Minerals (CENTRUM SILVER) tablet Take 1 tablet by mouth daily.    omeprazole (PRILOSEC) 20 MG capsule TAKE 1 CAPSULE BY MOUTH EVERY DAY   pravastatin (PRAVACHOL) 40 MG tablet TAKE 1 TABLET BY MOUTH EVERY DAY AT BEDTIME   raloxifene (EVISTA) 60 MG tablet TAKE 1 TABLET(60 MG) BY MOUTH DAILY   WIXELA INHUB 100-50 MCG/DOSE AEPB INHALE 1 PUFF INTO THE LUNGS TWICE DAILY   XARELTO 20 MG TABS tablet TAKE 1 TABLET BY MOUTH EVERY DAY   No facility-administered medications prior to visit.    Review of Systems  Constitutional:  Negative for fatigue.  Skin:  Positive for wound.  Neurological:  Negative for dizziness and headaches.   Last CBC Lab Results  Component Value Date   WBC 10.2 02/02/2021   HGB 13.1 02/02/2021   HCT 40.5 02/02/2021   MCV 96.2 02/02/2021   MCH 31.1 02/02/2021   RDW 13.4 02/02/2021   PLT 205 03/83/3383   Last metabolic panel Lab Results  Component Value Date   GLUCOSE 189 (H) 02/02/2021   NA 134 (L) 02/02/2021   K 4.1  02/02/2021   CL 100 02/02/2021   CO2 27 02/02/2021   BUN 14 02/02/2021   CREATININE 0.68 02/02/2021   GFRNONAA >60 02/02/2021   CALCIUM 8.8 (L) 02/02/2021   PROT 6.9 02/02/2021   ALBUMIN 4.0 02/02/2021   LABGLOB 2.2 09/24/2020   AGRATIO 1.9 09/24/2020   BILITOT 0.6 02/02/2021   ALKPHOS 81 02/02/2021   AST 23 02/02/2021   ALT 16 02/02/2021   ANIONGAP 7 02/02/2021   Last lipids Lab Results  Component Value Date   CHOL 154 09/24/2020   HDL 76 09/24/2020   LDLCALC 65 09/24/2020   TRIG 64 09/24/2020   CHOLHDL 2.0 09/24/2020   Last hemoglobin A1c Lab Results  Component Value Date   HGBA1C 7.5 (H)  09/24/2020   Last thyroid functions Lab Results  Component Value Date   TSH 1.800 09/24/2020     Objective    BP (!) 155/67    Pulse 97    Resp 16    Wt 160 lb 3.2 oz (72.7 kg)    BMI 28.38 kg/m  BP Readings from Last 3 Encounters:  02/07/21 (!) 155/67  02/02/21 (!) 163/75  09/24/20 131/72   Wt Readings from Last 3 Encounters:  02/07/21 160 lb 3.2 oz (72.7 kg)  02/02/21 155 lb (70.3 kg)  09/24/20 161 lb (73 kg)      Physical Exam Constitutional:      Appearance: She is not ill-appearing.  HENT:     Head: Normocephalic.  Eyes:     Conjunctiva/sclera: Conjunctivae normal.  Cardiovascular:     Rate and Rhythm: Normal rate.  Pulmonary:     Effort: Pulmonary effort is normal. No respiratory distress.  Skin:    Comments: Curved linear laceration to right temporal area. No associated ecchymosis Hemostasis maintained 12 staples Not tender  Neurological:     General: No focal deficit present.     Mental Status: She is alert and oriented to person, place, and time.  Psychiatric:        Mood and Affect: Mood normal.        Behavior: Behavior normal.      No results found for any visits on 02/07/21.  Assessment & Plan     Staple removal 12 staples were removed, minimal bleeding immediate hemostasis Wound was cleaned and steri strips placed on most friable area. Advised no salon visits for another week If bleeding occurs to put pressure on the area.  Return if headaches, blurry vision, dizziness, weakness Advised caution for future mechanical falls  Return if symptoms worsen or fail to improve, and as scheduled for chronic f/u.      I, [name], PA-C have reviewed all documentation for this visit. The documentation on  02/07/2021 for the exam, diagnosis, procedures, and orders are all accurate and complete.    [name], PA-C  [facility] [phone] (phone) [phone] (fax)  [place]

## 2021-02-07 ENCOUNTER — Other Ambulatory Visit: Payer: Self-pay

## 2021-02-07 ENCOUNTER — Encounter: Payer: Self-pay | Admitting: Physician Assistant

## 2021-02-07 ENCOUNTER — Ambulatory Visit (INDEPENDENT_AMBULATORY_CARE_PROVIDER_SITE_OTHER): Payer: Medicare Other | Admitting: Physician Assistant

## 2021-02-07 VITALS — BP 155/67 | HR 97 | Resp 16 | Wt 160.2 lb

## 2021-02-07 DIAGNOSIS — Z4802 Encounter for removal of sutures: Secondary | ICD-10-CM

## 2021-02-07 DIAGNOSIS — S0101XA Laceration without foreign body of scalp, initial encounter: Secondary | ICD-10-CM | POA: Diagnosis not present

## 2021-03-07 ENCOUNTER — Other Ambulatory Visit: Payer: Self-pay | Admitting: Family Medicine

## 2021-03-07 DIAGNOSIS — M1990 Unspecified osteoarthritis, unspecified site: Secondary | ICD-10-CM

## 2021-03-07 NOTE — Telephone Encounter (Signed)
Requested Prescriptions  Pending Prescriptions Disp Refills   omeprazole (PRILOSEC) 20 MG capsule [Pharmacy Med Name: OMEPRAZOLE 20MG  CAPSULES] 90 capsule 0    Sig: TAKE 1 CAPSULE BY MOUTH EVERY DAY     Gastroenterology: Proton Pump Inhibitors Passed - 03/07/2021 10:22 AM      Passed - Valid encounter within last 12 months    Recent Outpatient Visits          4 weeks ago Encounter for staple removal   [employer], [place], PA-C   5 months ago Type 2 diabetes mellitus without complication, without long-term current use of insulin ([place])   [facility] Jerrol Banana., MD   11 months ago Type 2 diabetes mellitus without complication, without long-term current use of insulin ([place])   [facility] Jerrol Banana., MD   1 year ago Pain and swelling of left ankle   [place], Vickki Muff, PA-C   1 year ago Herpes zoster without complication   [facility] Jerrol Banana., MD              FERROCITE 324 MG TABS tablet [Pharmacy Med Name: Melony Overly 324MG  TABLETS] 90 tablet 7    Sig: TAKE 1 [place] MORNING     Endocrinology:  Minerals - Iron Supplementation Failed - 03/07/2021 10:22 AM      Failed - Fe (serum) in normal range and within 360 days    No results found for: IRON, IRONPCTSAT       Failed - Ferritin in normal range and within 360 days    No results found for: FERRITIN       Passed - HGB in normal range and within 360 days    Hemoglobin  Date Value Ref Range Status  02/02/2021 13.1 12.0 - 15.0 g/dL Final  09/24/2020 13.1 11.1 - 15.9 g/dL Final         Passed - HCT in normal range and within 360 days    HCT  Date Value Ref Range Status  02/02/2021 40.5 36.0 - 46.0 % Final   Hematocrit  Date Value Ref Range Status  09/24/2020 38.3 34.0 - 46.6 % Final         Passed - RBC in normal range and within 360 days    RBC  Date Value Ref Range Status   02/02/2021 4.21 3.87 - 5.11 MIL/uL Final         Passed - Valid encounter within last 12 months    Recent Outpatient Visits          4 weeks ago Encounter for staple removal   [employer], [place], PA-C   5 months ago Type 2 diabetes mellitus without complication, without long-term current use of insulin [facility])   [facility] Jerrol Banana., MD   11 months ago Type 2 diabetes mellitus without complication, without long-term current use of insulin [facility])   [facility] Jerrol Banana., MD   1 year ago Pain and swelling of left ankle   [place], Vickki Muff, PA-C   1 year ago Herpes zoster without complication   [facility] Jerrol Banana., MD

## 2021-03-07 NOTE — Telephone Encounter (Signed)
Requested medication (s) are due for refill today: yes  Requested medication (s) are on the active medication list: yes  Last refill:  12/18/19  Future visit scheduled: yes  Notes to clinic:  Unable to refill per protocol due to failed labs, no updated results.      Requested Prescriptions  Pending Prescriptions Disp Refills   FERROCITE 324 MG TABS tablet [Pharmacy Med Name: FERROCITE 324MG  TABLETS] 90 tablet 7    Sig: TAKE 1 TABLET BY [place] MORNING     Endocrinology:  Minerals - Iron Supplementation Failed - 03/07/2021 10:22 AM      Failed - Fe (serum) in normal range and within 360 days    No results found for: IRON, IRONPCTSAT        Failed - Ferritin in normal range and within 360 days    No results found for: FERRITIN        Passed - HGB in normal range and within 360 days    Hemoglobin  Date Value Ref Range Status  02/02/2021 13.1 12.0 - 15.0 g/dL Final  09/24/2020 13.1 11.1 - 15.9 g/dL Final          Passed - HCT in normal range and within 360 days    HCT  Date Value Ref Range Status  02/02/2021 40.5 36.0 - 46.0 % Final   Hematocrit  Date Value Ref Range Status  09/24/2020 38.3 34.0 - 46.6 % Final          Passed - RBC in normal range and within 360 days    RBC  Date Value Ref Range Status  02/02/2021 4.21 3.87 - 5.11 MIL/uL Final          Passed - Valid encounter within last 12 months    Recent Outpatient Visits           4 weeks ago Encounter for staple removal   [employer], [place], PA-C   5 months ago Type 2 diabetes mellitus without complication, without long-term current use of insulin ([place])   [facility] Jerrol Banana., MD   11 months ago Type 2 diabetes mellitus without complication, without long-term current use of insulin [facility])   [facility] Jerrol Banana., MD   1 year ago Pain and swelling of left ankle   [place], Vickki Muff, PA-C   1  year ago Herpes zoster without complication   [facility] Jerrol Banana., MD              Signed Prescriptions Disp Refills   omeprazole (PRILOSEC) 20 MG capsule 90 capsule 0    Sig: TAKE 1 CAPSULE BY MOUTH EVERY DAY     Gastroenterology: Proton Pump Inhibitors Passed - 03/07/2021 10:22 AM      Passed - Valid encounter within last 12 months    Recent Outpatient Visits           4 weeks ago Encounter for staple removal   [facility] Thedore Mins, [place], PA-C   5 months ago Type 2 diabetes mellitus without complication, without long-term current use of insulin [facility])   [facility] Jerrol Banana., MD   11 months ago Type 2 diabetes mellitus without complication, without long-term current use of insulin [facility])   [facility] Jerrol Banana., MD   1 year ago Pain and swelling of left ankle   [place], Vickki Muff, [place]  1 year ago Herpes zoster without complication   [facility] Jerrol Banana., MD

## 2021-03-17 DIAGNOSIS — B351 Tinea unguium: Secondary | ICD-10-CM | POA: Diagnosis not present

## 2021-03-17 DIAGNOSIS — E1142 Type 2 diabetes mellitus with diabetic polyneuropathy: Secondary | ICD-10-CM | POA: Diagnosis not present

## 2021-03-21 ENCOUNTER — Other Ambulatory Visit: Payer: Self-pay | Admitting: Family Medicine

## 2021-03-21 DIAGNOSIS — Z20822 Contact with and (suspected) exposure to covid-19: Secondary | ICD-10-CM | POA: Diagnosis not present

## 2021-03-21 NOTE — Telephone Encounter (Signed)
Requested Prescriptions  Pending Prescriptions Disp Refills   ACCU-CHEK GUIDE test strip [Pharmacy Med Name: ACCU-CHEK GUIDE TEST STRIPS 100S] 100 strip 11    Sig: USE AS DIRECTED     Endocrinology: Diabetes - Testing Supplies Passed - 03/21/2021 10:55 AM      Passed - Valid encounter within last 12 months    Recent Outpatient Visits          1 month ago Encounter for staple removal   [employer], [place], PA-C   5 months ago Type 2 diabetes mellitus without complication, without long-term current use of insulin [facility])   [facility] Jerrol Banana., MD   11 months ago Type 2 diabetes mellitus without complication, without long-term current use of insulin [facility])   [facility] Jerrol Banana., MD   1 year ago Pain and swelling of left ankle   [place], Vickki Muff, PA-C   1 year ago Herpes zoster without complication   [facility] Jerrol Banana., MD

## 2021-03-26 ENCOUNTER — Other Ambulatory Visit: Payer: Self-pay | Admitting: Family Medicine

## 2021-03-26 DIAGNOSIS — M810 Age-related osteoporosis without current pathological fracture: Secondary | ICD-10-CM

## 2021-03-26 NOTE — Telephone Encounter (Signed)
Requested medication (s) are due for refill today:   Yes  Requested medication (s) are on the active medication list:   Yes Future visit scheduled:   No   Last ordered: 12/15/2020 #90, 0 refills  Returned because protocol criteria not met.  Calcium level and bone scan due   Requested Prescriptions  Pending Prescriptions Disp Refills   raloxifene (EVISTA) 60 MG tablet [Pharmacy Med Name: RALOXIFENE 60MG TABLETS] 90 tablet 0    Sig: TAKE 1 TABLET(60 MG) BY MOUTH DAILY     OB/GYN: Selective Estrogen Receptor Modulators 2 Failed - 03/26/2021 12:28 PM      Failed - Ca in normal range and within 360 days    Calcium  Date Value Ref Range Status  02/02/2021 8.8 (L) 8.9 - 10.3 mg/dL Final          Failed - Bone Mineral Density or Dexa Scan completed in the last 2 years      Passed - Valid encounter within last 12 months    Recent Outpatient Visits           1 month ago Encounter for staple removal   [employer], [place], PA-C   6 months ago Type 2 diabetes mellitus without complication, without long-term current use of insulin [facility])   [facility] Jerrol Banana., MD   12 months ago Type 2 diabetes mellitus without complication, without long-term current use of insulin [facility])   [facility] Jerrol Banana., MD   1 year ago Pain and swelling of left ankle   [place], Vickki Muff, PA-C   1 year ago Herpes zoster without complication   [facility] Jerrol Banana., MD

## 2021-03-27 ENCOUNTER — Encounter: Payer: Self-pay | Admitting: Family Medicine

## 2021-04-01 ENCOUNTER — Other Ambulatory Visit: Payer: Self-pay

## 2021-04-01 ENCOUNTER — Emergency Department
Admission: EM | Admit: 2021-04-01 | Discharge: 2021-04-01 | Disposition: A | Discharge status: Home / Self Care | Payer: Medicare Other | Attending: Emergency Medicine | Admitting: Emergency Medicine

## 2021-04-01 ENCOUNTER — Encounter: Payer: Self-pay | Admitting: Emergency Medicine

## 2021-04-01 ENCOUNTER — Emergency Department: Payer: Medicare Other

## 2021-04-01 DIAGNOSIS — S42202A Unspecified fracture of upper end of left humerus, initial encounter for closed fracture: Secondary | ICD-10-CM | POA: Insufficient documentation

## 2021-04-01 DIAGNOSIS — W109XXA Fall (on) (from) unspecified stairs and steps, initial encounter: Secondary | ICD-10-CM | POA: Insufficient documentation

## 2021-04-01 DIAGNOSIS — J45909 Unspecified asthma, uncomplicated: Secondary | ICD-10-CM | POA: Diagnosis not present

## 2021-04-01 DIAGNOSIS — Z7901 Long term (current) use of anticoagulants: Secondary | ICD-10-CM | POA: Insufficient documentation

## 2021-04-01 DIAGNOSIS — I10 Essential (primary) hypertension: Secondary | ICD-10-CM | POA: Insufficient documentation

## 2021-04-01 DIAGNOSIS — E119 Type 2 diabetes mellitus without complications: Secondary | ICD-10-CM | POA: Insufficient documentation

## 2021-04-01 DIAGNOSIS — R609 Edema, unspecified: Secondary | ICD-10-CM | POA: Diagnosis not present

## 2021-04-01 DIAGNOSIS — S0990XA Unspecified injury of head, initial encounter: Secondary | ICD-10-CM | POA: Diagnosis not present

## 2021-04-01 DIAGNOSIS — S0993XA Unspecified injury of face, initial encounter: Secondary | ICD-10-CM | POA: Diagnosis not present

## 2021-04-01 DIAGNOSIS — S199XXA Unspecified injury of neck, initial encounter: Secondary | ICD-10-CM | POA: Diagnosis not present

## 2021-04-01 DIAGNOSIS — S4982XA Other specified injuries of left shoulder and upper arm, initial encounter: Secondary | ICD-10-CM | POA: Diagnosis not present

## 2021-04-01 DIAGNOSIS — Z950 Presence of cardiac pacemaker: Secondary | ICD-10-CM | POA: Insufficient documentation

## 2021-04-01 DIAGNOSIS — M25519 Pain in unspecified shoulder: Secondary | ICD-10-CM | POA: Diagnosis not present

## 2021-04-01 DIAGNOSIS — W19XXXA Unspecified fall, initial encounter: Secondary | ICD-10-CM | POA: Diagnosis not present

## 2021-04-01 DIAGNOSIS — S42212A Unspecified displaced fracture of surgical neck of left humerus, initial encounter for closed fracture: Secondary | ICD-10-CM | POA: Diagnosis not present

## 2021-04-01 DIAGNOSIS — S42292A Other displaced fracture of upper end of left humerus, initial encounter for closed fracture: Secondary | ICD-10-CM

## 2021-04-01 DIAGNOSIS — S4992XA Unspecified injury of left shoulder and upper arm, initial encounter: Secondary | ICD-10-CM | POA: Diagnosis present

## 2021-04-01 LAB — CBC WITH DIFFERENTIAL/PLATELET
Abs Immature Granulocytes: 0.03 10*3/uL (ref 0.00–0.07)
Basophils Absolute: 0 10*3/uL (ref 0.0–0.1)
Basophils Relative: 1 %
Eosinophils Absolute: 0.1 10*3/uL (ref 0.0–0.5)
Eosinophils Relative: 1 %
HCT: 39.7 % (ref 36.0–46.0)
Hemoglobin: 13 g/dL (ref 12.0–15.0)
Immature Granulocytes: 0 %
Lymphocytes Relative: 38 %
Lymphs Abs: 2.9 10*3/uL (ref 0.7–4.0)
MCH: 30.6 pg (ref 26.0–34.0)
MCHC: 32.7 g/dL (ref 30.0–36.0)
MCV: 93.4 fL (ref 80.0–100.0)
Monocytes Absolute: 0.7 10*3/uL (ref 0.1–1.0)
Monocytes Relative: 9 %
Neutro Abs: 4 10*3/uL (ref 1.7–7.7)
Neutrophils Relative %: 51 %
Platelets: 205 10*3/uL (ref 150–400)
RBC: 4.25 MIL/uL (ref 3.87–5.11)
RDW: 13.2 % (ref 11.5–15.5)
WBC: 7.8 10*3/uL (ref 4.0–10.5)
nRBC: 0 % (ref 0.0–0.2)

## 2021-04-01 LAB — COMPREHENSIVE METABOLIC PANEL
ALT: 15 U/L (ref 0–44)
AST: 26 U/L (ref 15–41)
Albumin: 4.2 g/dL (ref 3.5–5.0)
Alkaline Phosphatase: 78 U/L (ref 38–126)
Anion gap: 9 (ref 5–15)
BUN: 14 mg/dL (ref 8–23)
CO2: 26 mmol/L (ref 22–32)
Calcium: 9.1 mg/dL (ref 8.9–10.3)
Chloride: 98 mmol/L (ref 98–111)
Creatinine, Ser: 0.54 mg/dL (ref 0.44–1.00)
GFR, Estimated: >60 mL/min (ref >60)
Glucose, Bld: 164 mg/dL — ABNORMAL HIGH (ref 70–99)
Potassium: 3.6 mmol/L (ref 3.5–5.1)
Sodium: 133 mmol/L — ABNORMAL LOW (ref 135–145)
Total Bilirubin: 0.4 mg/dL (ref 0.3–1.2)
Total Protein: 6.9 g/dL (ref 6.5–8.1)

## 2021-04-01 MED ORDER — OXYCODONE HCL 5 MG PO TABS
5.0000 mg | ORAL_TABLET | Freq: Three times a day (TID) | ORAL | 0 refills | Status: AC | PRN
Start: 2021-04-01 — End: 2021-04-04

## 2021-04-01 MED ORDER — ACETAMINOPHEN 500 MG PO TABS
1000.0000 mg | ORAL_TABLET | Freq: Once | ORAL | Status: AC
Start: 2021-04-01 — End: 2021-04-01
  Administered 2021-04-01: 1000 mg via ORAL
  Filled 2021-04-01: qty 2

## 2021-04-01 NOTE — Progress Notes (Unsigned)
Annual Wellness Visit     Patient: Taylor Snyder, Female    DOB: Jun 06, 1928, 88 y.o.   MRN: [id number] Visit Date: 04/03/2021  Today's Provider: [name], MD   No chief complaint on file.  Subjective    Taylor Snyder is a 88 y.o. female who presents today for her Annual Wellness Visit. She reports consuming a {diet types:17450} diet. {Exercise:19826} She generally feels {well/fairly well/poorly:18703}. She reports sleeping {well/fairly well/poorly:18703}. She {does/does not:200015} have additional problems to discuss today.      Medications: Outpatient Medications Prior to Visit  Medication Sig   Accu-Chek FastClix Lancets MISC USE TO CHECK GLUCOSE DAILY AND AS NEEDED   ACCU-CHEK GUIDE test strip USE AS DIRECTED   acetaminophen (TYLENOL) 650 MG CR tablet Take 650 mg by mouth daily as needed for pain.   Blood Glucose Monitoring Suppl (ACCU-CHEK GUIDE) w/Device KIT 1 each by Does not apply route daily.   Calcium Carbonate-Vitamin D 600-400 MG-UNIT per tablet Take 1 tablet by mouth daily.    cetirizine (ZYRTEC) 10 MG tablet Take 10 mg by mouth at bedtime as needed for allergies.    Cholecalciferol (VITAMIN D3) 2000 units TABS Take by mouth.   enalapril (VASOTEC) 5 MG tablet TAKE 1 TABLET BY MOUTH EVERY DAY   FERROCITE 324 MG TABS tablet TAKE 1 TABLET BY MOUTH EVERY MORNING   fluticasone (FLONASE) 50 MCG/ACT nasal spray USE 2 SPRAYS IN EACH NOSTRIL ONCE DAILY (Patient taking differently: Place 1 spray into both nostrils as needed.)   Fluticasone-Salmeterol (ADVAIR) 100-50 MCG/DOSE AEPB Inhale 1 puff into the lungs 2 (two) times daily as needed.   gabapentin (NEURONTIN) 100 MG capsule Take 1 capsule (100 mg total) by mouth 2 (two) times daily.   Glucosamine-Chondroitin (OSTEO BI-FLEX REGULAR STRENGTH PO) Take by mouth daily. Twice a day   glucose blood (ACCU-CHEK GUIDE) test strip Use as instructed   glucose blood test strip Use to check glucose daily and as needed    Lancets Misc. (ACCU-CHEK SOFTCLIX LANCET DEV) KIT Dispense 1 kit   meloxicam (MOBIC) 7.5 MG tablet TAKE 1 TABLET BY MOUTH EVERY DAY   metFORMIN (GLUCOPHAGE) 500 MG tablet TAKE 2 TABLETS BY MOUTH EVERY DAY   Multiple Vitamins-Minerals (CENTRUM SILVER) tablet Take 1 tablet by mouth daily.    omeprazole (PRILOSEC) 20 MG capsule TAKE 1 CAPSULE BY MOUTH EVERY DAY   pravastatin (PRAVACHOL) 40 MG tablet TAKE 1 TABLET BY MOUTH EVERY DAY AT BEDTIME   raloxifene (EVISTA) 60 MG tablet TAKE 1 TABLET(60 MG) BY MOUTH DAILY   WIXELA INHUB 100-50 MCG/DOSE AEPB INHALE 1 PUFF INTO THE LUNGS TWICE DAILY   XARELTO 20 MG TABS tablet TAKE 1 TABLET BY MOUTH EVERY DAY   No facility-administered medications prior to visit.    Allergies  Allergen Reactions   Codeine Sulfate Other (See Comments)    Altered mental status   Codeine Other (See Comments)    Other Reaction: insomnia Unable to sleep, hypes her up    Patient Care Team: Jerrol Banana., MD as PCP - General (Family Medicine) Corey Skains, MD as Consulting Physician (Cardiology) Birder Robson, MD as Referring Physician (Ophthalmology) Sharlotte Alamo, DPM (Podiatry)  Review of Systems  All other systems reviewed and are negative.  {Labs   Heme   Chem   Endocrine   Serology   Results Review (optional):23779}    Objective    Vitals: There were no vitals taken for this visit. {  Show previous vital signs (optional):23777}  Physical Exam ***  Most recent functional status assessment: In your present state of health, do you have any difficulty performing the following activities: 09/24/2020  Hearing? Y  Vision? Y  Difficulty concentrating or making decisions? N  Walking or climbing stairs? N  Dressing or bathing? N  Doing errands, shopping? N  Some recent data might be hidden   Most recent fall risk assessment: Fall Risk  09/24/2020  Falls in the past year? 0  Comment -  Number falls in past yr: 0  Injury with Fall? 0  Risk for  fall due to : Impaired balance/gait  Follow up Falls evaluation completed    Most recent depression screenings: PHQ 2/9 Scores 09/24/2020 03/27/2020  PHQ - 2 Score 0 0  PHQ- 9 Score 0 -   Most recent cognitive screening: 6CIT Screen 02/26/2016  What Year? 0 points  What month? 0 points  What time? 0 points  Count back from 20 0 points  Months in reverse 0 points  Repeat phrase 0 points  Total Score 0   Most recent Audit-C alcohol use screening Alcohol Use Disorder Test (AUDIT) 09/24/2020  1. How often do you have a drink containing alcohol? 0  2. How many drinks containing alcohol do you have on a typical day when you are drinking? 0  3. How often do you have six or more drinks on one occasion? 0  AUDIT-C Score 0  Alcohol Brief Interventions/Follow-up -   A score of 3 or more in women, and 4 or more in men indicates increased risk for alcohol abuse, EXCEPT if all of the points are from question 1   No results found for any visits on 04/03/21.  Assessment & Plan     Annual wellness visit done today including the all of the following: Reviewed patient's Family Medical History Reviewed and updated list of patient's medical providers Assessment of cognitive impairment was done Assessed patient's functional ability Established a written schedule for health screening [place] Completed and Reviewed  Exercise Activities and Dietary recommendations  Goals      Cut out extra servings     Recommend to avoid junk food and if wanting a snack to eat fruits and vegetables instead.        Immunization History  Administered Date(s) Administered   Fluad Quad(high Dose 65+) 01/05/2019, 10/30/2019   Influenza, High Dose Seasonal PF 10/31/2014, 12/24/2015, 12/10/2016, 11/30/2017   Influenza-Unspecified 12/03/2020   Moderna Sars-Covid-2 Vaccination 04/04/2019, 05/02/2019, 01/18/2020, 10/07/2020   Pneumococcal Conjugate-13 07/19/2013   Pneumococcal Polysaccharide-23  10/04/1997   Td 03/01/2009   Zoster Recombinat (Shingrix) 12/09/2020, 03/24/2021    Health Maintenance  Topic Date Due   TETANUS/TDAP  03/02/2019   DEXA SCAN  08/02/2020   FOOT EXAM  10/29/2020   COVID-19 Vaccine (5 - Booster for Moderna series) 12/02/2020   HEMOGLOBIN A1C  03/27/2021   OPHTHALMOLOGY EXAM  10/16/2021   Pneumonia Vaccine 43+ Years old  Completed   INFLUENZA VACCINE  Completed   Zoster Vaccines- Shingrix  Completed   HPV VACCINES  Aged Out     Discussed health benefits of physical activity, and encouraged her to engage in regular exercise appropriate for her age and condition.    ***  No follow-ups on file.     {provider attestation***:1}   [name], MD  [facility] [phone] (phone) [phone] (fax)  [place]

## 2021-04-01 NOTE — ED Triage Notes (Signed)
Pt reports that she fell down a few steps at home, she does take blood thinners. Denies hitting head but did hit her right shoulder.

## 2021-04-01 NOTE — ED Provider Notes (Signed)
[facility] Provider Note    Event Date/Time   First MD Initiated Contact with Patient 04/01/21 1915     (approximate)   History   Fall   HPI  Taylor Snyder is a 88 y.o. female with past medical history of atrial fibrillation on Xarelto, diabetes, asthma and anemia who presents with left shoulder pain after a fall.  Patient was walking down the stairs today she was at the bottom step when her shoes caught on the stair she fell onto her left shoulder.  Did not hit her head or lose consciousness.  She endorses pain in the left shoulder and swelling but denies any numbness tingling in her hand.    Past Medical History:  Diagnosis Date   A-fib ([place])    Anemia    Asthma    Colon polyps 2005   Diabetes mellitus without complication ([place]) 0093   Dysrhythmia    GERD (gastroesophageal reflux disease)    HOH (hard of hearing)    AIDS   Hyperlipidemia    Hypertension    Obesity    Presence of permanent cardiac pacemaker    Rheumatic mitral valve failure    Unspecified atrial fibrillation [facility])     Patient Active Problem List   Diagnosis Date Noted   A-fib ([place]) 06/18/2014   Allergic rhinitis 06/18/2014   Airway hyperreactivity 06/18/2014   CAFL (chronic airflow limitation) ([place]) 06/18/2014   Diabetes mellitus, type 2 ([place]) 06/18/2014   HLD (hyperlipidemia) 06/18/2014   BP (high blood pressure) 06/18/2014   History of prolonged Q-T interval on ECG 06/18/2014   Mitral valve disorder 06/18/2014   Arthritis, degenerative 06/18/2014   Adiposity 06/18/2014   OP (osteoporosis) 06/18/2014   Benign essential HTN 06/08/2014   Anemia 12/28/2013   Combined fat and carbohydrate induced hyperlipemia 12/28/2013   MI (mitral incompetence) 12/28/2013   Sick sinus syndrome ([place]) 12/28/2013   Anemia, iron deficiency 10/18/2013   Personal history of colonic polyps 08/29/2012   Colon polyps      Physical Exam  Triage Vital Signs: ED Triage Vitals  Enc Vitals  Group     BP 04/01/21 1734 (!) 149/108     Pulse Rate 04/01/21 1734 80     Resp 04/01/21 1734 18     Temp 04/01/21 1734 98.6 F (37 C)     Temp Source 04/01/21 1734 Oral     SpO2 04/01/21 1734 95 %     Weight --      Height 04/01/21 1828 5\' 3"  (1.6 m)     Head Circumference --      Peak Flow --      Pain Score 04/01/21 1828 4     Pain Loc --      Pain Edu? --      Excl. in [place]? --     Most recent vital signs: Vitals:   04/01/21 1734  BP: (!) 149/108  Pulse: 80  Resp: 18  Temp: 98.6 F (37 C)  SpO2: 95%     General: Awake, no distress.  CV:  Good peripheral perfusion.  Resp:  Normal effort.  Abd:  No distention.  Neuro:             Awake, Alert, Oriented x 3  Other:  Swelling and deformity of the left anterior shoulder, pain with range of motion, no significant tenderness of the humerus elbow or forearm or wrist, 2+ radial pulse, intact, thumbs up, finger abduction and okay sign  ED Results / Procedures / Treatments  Labs (all labs ordered are listed, but only abnormal results are displayed) Labs Reviewed  COMPREHENSIVE METABOLIC PANEL - Abnormal; Notable for the following components:      Result Value   Sodium 133 (*)    Glucose, Bld 164 (*)    All other components within normal limits  CBC WITH DIFFERENTIAL/PLATELET  URINALYSIS, COMPLETE (UACMP) WITH MICROSCOPIC     EKG     RADIOLOGY I reviewed the CT scan of the brain which does not show any acute intracranial process; agree with radiology report   I reviewed the CT of the cervical spine which does not show any acute fracture or misalignment; agree with radiology report   I reviewed the x-ray of the left shoulder which shows a humeral head fracture   PROCEDURES:     MEDICATIONS ORDERED IN ED: Medications  acetaminophen (TYLENOL) tablet 1,000 mg (has no administration in time range)     IMPRESSION / MDM / ASSESSMENT AND PLAN / ED COURSE  I reviewed the triage vital signs and the nursing  notes.                              Differential diagnosis includes, but is not limited to, shoulder dislocation, clavicle fracture, AC separation, humeral head fracture  Patient is a 88 year old female presents after mechanical fall onto her left shoulder.  She is anticoagulated did not hit her head or lose consciousness.  CT head and C-spine were obtained which are negative for acute injury.  She has obvious swelling and deformity of the left anterior shoulder without tenderness to the rest of the arm.  X-ray demonstrates the displaced humeral head fracture.  She is neurovascular intact.  There is no chest wall tenderness abdominal tenderness hip or other extremity tenderness.  Labs were obtained from triage including a CBC and CMP which are largely within normal limits.  Patient has no other complaints at this time.  She requests Tylenol and nothing stronger for now.  She is anticoagulated so we will avoid NSAIDs.  Did write a very short course of 5 mg oxycodone advised that she be very cautious of taking this only for breakthrough pain due to concern for falls.  She was placed in a sling.  Referred to orthopedics as an outpatient.      FINAL CLINICAL IMPRESSION(S) / ED DIAGNOSES   Final diagnoses:  Humeral head fracture, left, closed, initial encounter     Rx / DC Orders   ED Discharge Orders          Ordered    oxyCODONE (ROXICODONE) 5 MG immediate release tablet  Every 8 hours PRN        04/01/21 2006             Note:  This document was prepared using Dragon voice recognition software and may include unintentional dictation errors.   Rada Hay, MD 04/01/21 2019

## 2021-04-01 NOTE — ED Notes (Signed)
See triage note. Pt states it was her LEFT shoulder that she hit when she fell. Pt unable to raise arm from lap due to reported pain. Distal pulse intact.

## 2021-04-01 NOTE — Discharge Instructions (Addendum)
Please wear the sling until you see the orthopedic doctors.  You can take Tylenol 1 g every 8 hours.  You can take the oxycodone as needed for breakthrough pain but please be careful as this can make you less steady on your feet and put you at risk for falls.

## 2021-04-01 NOTE — ED Provider Triage Note (Signed)
Emergency Medicine Provider Triage Evaluation Note  Taylor Snyder , a 88 y.o. female  was evaluated in triage.  Patient has a history of A-fib currently anticoagulated with Xarelto, diabetes, anemia, hypertension and GERD presents to the emergency department after she had a mechanical fall.  She fell down 2 steps coming into her basement.  She is unsure if she hit her head but states that she fell on her left shoulder and has had difficulty moving upper extremity since injury occurred.  She denies pain in her back and her neck.  No chest pain, chest tightness or abdominal pain.  Review of Systems  Positive: Patient has shoulder pain.  Negative:  No chest pain or chest tightness.   Physical Exam  BP (!) 149/108    Pulse 80    Temp 98.6 F (37 C) (Oral)    Resp 18    SpO2 95%  Gen:   Awake, no distress   Resp:  Normal effort  MSK:   Patient has limited range of motion at left shoulder. Other:    Medical Decision Making  Medically screening exam initiated at 5:38 PM.  Appropriate orders placed.  Taylor Snyder was informed that the remainder of the evaluation will be completed by another provider, this initial triage assessment does not replace that evaluation, and the importance of remaining in the ED until their evaluation is complete.     Vallarie Mare [place], [place] 04/01/21 1739

## 2021-04-01 NOTE — ED Notes (Signed)
First Nurse Note:  Pt to ED via [facility] from home for fall. Pt was walking down the steps and fell. Pt landed on her left shoulder. Pt is c/o left shoulder pain and upper arm. Pt denies hitting her head. Pt is on blood thinners. Pt is in NAD.

## 2021-04-02 ENCOUNTER — Telehealth: Payer: Self-pay

## 2021-04-03 ENCOUNTER — Encounter: Payer: Medicare Other | Admitting: Family Medicine

## 2021-04-07 DIAGNOSIS — S42255A Nondisplaced fracture of greater tuberosity of left humerus, initial encounter for closed fracture: Secondary | ICD-10-CM | POA: Diagnosis not present

## 2021-04-08 NOTE — Telephone Encounter (Signed)
Encounter opened on error. KW

## 2021-04-09 ENCOUNTER — Encounter: Payer: Self-pay | Admitting: Family Medicine

## 2021-04-09 ENCOUNTER — Ambulatory Visit (INDEPENDENT_AMBULATORY_CARE_PROVIDER_SITE_OTHER): Payer: Medicare Other | Admitting: Family Medicine

## 2021-04-09 ENCOUNTER — Other Ambulatory Visit: Payer: Self-pay

## 2021-04-09 VITALS — BP 129/64 | HR 89 | Temp 98.0°F | Resp 14 | Ht 63.0 in | Wt 158.3 lb

## 2021-04-09 DIAGNOSIS — E119 Type 2 diabetes mellitus without complications: Secondary | ICD-10-CM

## 2021-04-09 DIAGNOSIS — S0101XD Laceration without foreign body of scalp, subsequent encounter: Secondary | ICD-10-CM | POA: Diagnosis not present

## 2021-04-09 DIAGNOSIS — I4819 Other persistent atrial fibrillation: Secondary | ICD-10-CM

## 2021-04-09 DIAGNOSIS — Z23 Encounter for immunization: Secondary | ICD-10-CM | POA: Diagnosis not present

## 2021-04-09 DIAGNOSIS — M810 Age-related osteoporosis without current pathological fracture: Secondary | ICD-10-CM

## 2021-04-09 DIAGNOSIS — S0101XA Laceration without foreign body of scalp, initial encounter: Secondary | ICD-10-CM | POA: Insufficient documentation

## 2021-04-09 DIAGNOSIS — S42292A Other displaced fracture of upper end of left humerus, initial encounter for closed fracture: Secondary | ICD-10-CM | POA: Diagnosis not present

## 2021-04-09 LAB — POCT GLYCOSYLATED HEMOGLOBIN (HGB A1C)
Est. average glucose Bld gHb Est-mCnc: 166
Hemoglobin A1C: 7.4 % — AB (ref 4.0–5.6)

## 2021-04-09 NOTE — Progress Notes (Signed)
Established patient visit   Patient: Taylor Snyder   DOB: 1929-02-13   88 y.o. Female  MRN: [id number] Visit Date: 04/09/2021  Today's healthcare provider: [name], MD   Chief Complaint  Patient presents with   follow up ER   Subjective    HPI  Patient had a mechanical fall on the stairs fracturing her left proximal humerus.  Being treated with a sling. Follow up ER visit  Patient was seen in ER for Fall on 04/01/2021. She was treated for Humeral head fracture,Fall. Treatment for this included; see notes in chart. She reports good compliance with treatment. She reports this condition is Improved. Pt is overall much better...has some soreness with movement and bathing.  PT to begin 04-30-21. Pt is taking Tylenol arthritis for discomfort with relief.     ----------------------------------------------------------------------------------------- Diabetes Mellitus Type II, Follow-up  Lab Results  Component Value Date   HGBA1C 7.5 (H) 09/24/2020   HGBA1C 6.9 (A) 03/27/2020   HGBA1C 7.5 (H) 10/31/2019   Wt Readings from Last 3 Encounters:  04/09/21 158 lb 4.8 oz (71.8 kg)  02/07/21 160 lb 3.2 oz (72.7 kg)  02/02/21 155 lb (70.3 kg)   Last seen for diabetes 6 months ago.  Management since then includes none. She reports excellent compliance with treatment. She is not having side effects.   Home blood sugar records:  140;s  Episodes of hypoglycemia? No   Most Recent Eye Exam: UTD-sees Dr George Ina at [facility]: Lab Results  Component Value Date   CHOL 154 09/24/2020   HDL 76 09/24/2020   LDLCALC 65 09/24/2020   TRIG 64 09/24/2020   CHOLHDL 2.0 09/24/2020   Lab Results  Component Value Date   NA 133 (L) 04/01/2021   K 3.6 04/01/2021   CREATININE 0.54 04/01/2021   GFRNONAA >60 04/01/2021   MICROALBUR 20 03/16/2017     ---------------------------------------------------------------------------------------------------    Medications: Outpatient Medications Prior to Visit  Medication Sig   Accu-Chek FastClix Lancets MISC USE TO CHECK GLUCOSE DAILY AND AS NEEDED   ACCU-CHEK GUIDE test strip USE AS DIRECTED   acetaminophen (TYLENOL) 650 MG CR tablet Take 650 mg by mouth daily as needed for pain.   Blood Glucose Monitoring Suppl (ACCU-CHEK GUIDE) w/Device KIT 1 each by Does not apply route daily.   Calcium Carbonate-Vitamin D 600-400 MG-UNIT per tablet Take 1 tablet by mouth daily.    cetirizine (ZYRTEC) 10 MG tablet Take 10 mg by mouth at bedtime as needed for allergies.    Cholecalciferol (VITAMIN D3) 2000 units TABS Take by mouth.   enalapril (VASOTEC) 5 MG tablet TAKE 1 TABLET BY MOUTH EVERY DAY   FERROCITE 324 MG TABS tablet TAKE 1 TABLET BY MOUTH EVERY MORNING   fluticasone (FLONASE) 50 MCG/ACT nasal spray USE 2 SPRAYS IN EACH NOSTRIL ONCE DAILY (Patient taking differently: Place 1 spray into both nostrils as needed.)   Fluticasone-Salmeterol (ADVAIR) 100-50 MCG/DOSE AEPB Inhale 1 puff into the lungs 2 (two) times daily as needed.   gabapentin (NEURONTIN) 100 MG capsule Take 1 capsule (100 mg total) by mouth 2 (two) times daily.   Glucosamine-Chondroitin (OSTEO BI-FLEX REGULAR STRENGTH PO) Take by mouth daily. Twice a day   glucose blood (ACCU-CHEK GUIDE) test strip Use as instructed   glucose blood test strip Use to check glucose daily and as needed   Lancets Misc. (ACCU-CHEK SOFTCLIX LANCET DEV) KIT Dispense 1 kit   meloxicam (MOBIC) 7.5 MG tablet  TAKE 1 TABLET BY MOUTH EVERY DAY   metFORMIN (GLUCOPHAGE) 500 MG tablet TAKE 2 TABLETS BY MOUTH EVERY DAY   Multiple Vitamins-Minerals (CENTRUM SILVER) tablet Take 1 tablet by mouth daily.    omeprazole (PRILOSEC) 20 MG capsule TAKE 1 CAPSULE BY MOUTH EVERY DAY   pravastatin (PRAVACHOL) 40 MG tablet TAKE 1 TABLET BY MOUTH EVERY DAY AT BEDTIME   raloxifene (EVISTA) 60 MG tablet TAKE 1 TABLET(60 MG) BY MOUTH DAILY   WIXELA INHUB 100-50 MCG/DOSE AEPB INHALE 1  PUFF INTO THE LUNGS TWICE DAILY   XARELTO 20 MG TABS tablet TAKE 1 TABLET BY MOUTH EVERY DAY   No facility-administered medications prior to visit.    Review of Systems  Constitutional:  Negative for appetite change, chills, fatigue and fever.  Respiratory:  Negative for chest tightness and shortness of breath.   Cardiovascular:  Negative for chest pain and palpitations.  Gastrointestinal:  Negative for abdominal pain, nausea and vomiting.  Neurological:  Negative for dizziness and weakness.      Objective    BP 129/64 (BP Location: Right Arm, Patient Position: Standing, Cuff Size: Normal)    Pulse 89    Temp 98 F (36.7 C) (Temporal)    Resp 14    Ht _0  (1.6 m)    Wt 158 lb 4.8 oz (71.8 kg)    BMI 28.04 kg/m  {Show previous vital signs (optional):23777}  Physical Exam Vitals reviewed.  Constitutional:      Appearance: Normal appearance. She is well-developed and normal weight.  HENT:     Head: Normocephalic and atraumatic.     Right Ear: External ear normal.     Left Ear: External ear normal.     Nose: Nose normal.     Mouth/Throat:     Pharynx: Oropharynx is clear.  Eyes:     General: No scleral icterus.    Conjunctiva/sclera: Conjunctivae normal.  Neck:     Thyroid: No thyromegaly.     Vascular: No carotid bruit.  Cardiovascular:     Rate and Rhythm: Normal rate and regular rhythm.     Heart sounds: Normal heart sounds.  Pulmonary:     Effort: Pulmonary effort is normal.     Breath sounds: Normal breath sounds.  Abdominal:     Palpations: Abdomen is soft.  Musculoskeletal:     Right lower leg: Edema present.     Left lower leg: Edema present.     Comments: Trace lower extremity edema  Skin:    General: Skin is warm and dry.  Neurological:     General: No focal deficit present.     Mental Status: She is alert and oriented to person, place, and time.  Psychiatric:        Mood and Affect: Mood normal.        Behavior: Behavior normal.        Thought  Content: Thought content normal.        Judgment: Judgment normal.      No results found for any visits on 04/09/21.  Assessment & Plan     1. Laceration of scalp, subsequent encounter Healed - Tdap vaccine greater than or equal to 7yo IM  2. Type 2 diabetes mellitus without complication, without long-term current use of insulin (HCC) A1c under good control at 7.2% - POCT glycosylated hemoglobin (Hb A1C)  3. Persistent atrial fibrillation ([place])   4. Osteoporosis, unspecified osteoporosis type, unspecified pathological fracture presence BMD later this year  5.  Closed fracture of head of left humerus, initial encounter Managed with sling and physical therapy   No follow-ups on file.      I, [name], MD, have reviewed all documentation for this visit. The documentation on 04/12/21 for the exam, diagnosis, procedures, and orders are all accurate and complete.    Taylor Snyder Mon, MD  [facility] [phone] (phone) [phone] (fax)  [place]

## 2021-04-11 ENCOUNTER — Ambulatory Visit: Payer: Self-pay

## 2021-04-11 NOTE — Telephone Encounter (Signed)
° ° °  Chief Complaint: Daughter reports pt. Feels anxious, restless. Pt. Asking for "something to help." Symptoms: Decreased appetite Frequency: Started since her shoulder injury."She's used to coming and going and now she can't." Pertinent Negatives: Patient denies depression Disposition: [] ED /[] Urgent Care (no appt availability in office) / [] Appointment(In office/virtual)/ []  [facility]/ [] Home Care/ [] Refused Recommended Disposition /[] [facility]/ []  Follow-up with PCP Additional Notes: Asking for medication for her "mild anxiety related to her injury and decreased independence." Please advise.  Answer Assessment - Initial Assessment Questions 1. CONCERN: "Did anything happen that prompted you to call today?"      Anxiety 2. ANXIETY SYMPTOMS: "Can you describe how you (your loved one; patient) have been feeling?" (e.g., tense, restless, panicky, anxious, keyed up, overwhelmed, sense of impending doom).      Anxiety 3. ONSET: "How long have you been feeling this way?" (e.g., hours, days, weeks)     This week 4. SEVERITY: "How would you rate the level of anxiety?" (e.g., 0 - 10; or mild, moderate, severe).     Mild 5. FUNCTIONAL IMPAIRMENT: "How have these feelings affected your ability to do daily activities?" "Have you had more difficulty than usual doing your normal daily activities?" (e.g., getting better, same, worse; self-care, school, work, interactions)     Decreased appetite 6. HISTORY: "Have you felt this way before?" "Have you ever been diagnosed with an anxiety problem in the past?" (e.g., generalized anxiety disorder, panic attacks, PTSD). If Yes, ask: "How was this problem treated?" (e.g., medicines, counseling, etc.)     No 7. RISK OF HARM - SUICIDAL IDEATION: "Do you ever have thoughts of hurting or killing yourself?" If Yes, ask:  "Do you have these feelings now?" "Do you have a plan on how you would do this?"     No 8. TREATMENT:  "What has been  done so far to treat this anxiety?" (e.g., medicines, relaxation strategies). "What has helped?"     None 9. TREATMENT - THERAPIST: "Do you have a counselor or therapist? Name?"     No 10. POTENTIAL TRIGGERS: "Do you drink caffeinated beverages (e.g., coffee, colas, teas), and how much daily?" "Do you drink alcohol or use any drugs?" "Have you started any new medicines recently?"     No 10. PATIENT SUPPORT: "Who is with you now?" "Who do you live with?" "Do you have family or friends who you can talk to?"        Family 73. OTHER SYMPTOMS: "Do you have any other symptoms?" (e.g., feeling depressed, trouble concentrating, trouble sleeping, trouble breathing, palpitations or fast heartbeat, chest pain, sweating, nausea, or diarrhea)       "Feels restless." 12. PREGNANCY: "Is there any chance you are pregnant?" "When was your last menstrual period?"       No  Protocols used: Anxiety and Panic Attack-A-AH

## 2021-04-16 NOTE — Telephone Encounter (Signed)
Patient was advised of message and said that this was from 5 days ago and she is doing better from the panic attack shse was having.  ?

## 2021-04-18 DIAGNOSIS — Z20822 Contact with and (suspected) exposure to covid-19: Secondary | ICD-10-CM | POA: Diagnosis not present

## 2021-04-21 ENCOUNTER — Telehealth: Payer: Self-pay

## 2021-04-21 NOTE — Telephone Encounter (Signed)
Taylor Snyder called again complaining of panic attacks.  She states that when they happen she feels like she is smothering.  They are occurring more frequently.  When asking about what is triggering them or if there was anything going on that was increasing her stress, she stated two things.  She is claustrophobic and with the injured shoulder she is more confined to the house and feels this is may be contributing.  Also she said she is having very dark stools and she is concerned because she is on Xarelto.  She denies any active bleeding, shortness of breath or weakness.   ?I have made her an appointment with Ria Comment tomorrow but she is asking what she can do in the mean time for the panic attacks. ?Thanks ? ?

## 2021-04-22 ENCOUNTER — Encounter: Payer: Self-pay | Admitting: Physician Assistant

## 2021-04-22 ENCOUNTER — Ambulatory Visit (INDEPENDENT_AMBULATORY_CARE_PROVIDER_SITE_OTHER): Payer: Medicare Other | Admitting: Physician Assistant

## 2021-04-22 ENCOUNTER — Other Ambulatory Visit: Payer: Self-pay

## 2021-04-22 VITALS — BP 137/45 | HR 80 | Ht 63.0 in | Wt 156.1 lb

## 2021-04-22 DIAGNOSIS — R195 Other fecal abnormalities: Secondary | ICD-10-CM | POA: Insufficient documentation

## 2021-04-22 DIAGNOSIS — F419 Anxiety disorder, unspecified: Secondary | ICD-10-CM | POA: Diagnosis not present

## 2021-04-22 MED ORDER — SERTRALINE HCL 25 MG PO TABS: 25.0000 mg | ORAL_TABLET | Freq: Every day | ORAL | 1 refills | Status: AC

## 2021-04-22 NOTE — Assessment & Plan Note (Signed)
Pt sent home with OC lite kit, will bring back into the office. ?Low suspicion for GI bleed, but pt is on xarelto.  ? ?

## 2021-04-22 NOTE — Assessment & Plan Note (Signed)
With likely panic attacks secondary to life changes and physical wearing of sling.  ?Discussed ways to manage panic, self care tips. Reviewed anxiety and panic with the pt. ?Rx sertraline 25 mg daily. Advised if any SE of dizziness/lightheadedness to d/c medication and call office. ? ?

## 2021-04-22 NOTE — Progress Notes (Signed)
I,[name],acting as a Education administrator for [employer], PA-C.,have documented all relevant documentation on the behalf of Taylor Kirschner, PA-C,as directed by  Taylor Kirschner, PA-C while in the presence of Taylor Kirschner, PA-C.  Acute Office Visit  Subjective:    Patient ID: Taylor Snyder, female    DOB: [birth year], 88 y.o.   MRN: [id number]  Cc. Dark stools, panic attacks  Taylor Snyder is a 88 y/o female who presents today accompanied by her daughter.   HPI Dark stools -For the last few days she has noticed her stools were darker than usual. Denies visible blood. Denies changes in frequency of her BM, denies diarrhea/constipation. Reports eating more oreos than usual. Denies abdominal pain, nausea, vomiting.  Panic Attacks --Pt attests to general anxiety and history of claustrophobia. Since she broke her arm and has been in a sling, she has been restless, anxious, with episodes of anxiety, flushing, warmth and suffocation that occurs suddenly. She has been using ice packs, fans, to help the warmth feeling. Reports more trouble sleeping than usual. Denies headaches, SOB, chest pain, chest tightness, dizziness.    Past Medical History:  Diagnosis Date   A-fib ([place])    Anemia    Asthma    Colon polyps 2005   Diabetes mellitus without complication ([place]) 5732   Dysrhythmia    GERD (gastroesophageal reflux disease)    HOH (hard of hearing)    AIDS   Hyperlipidemia    Hypertension    Obesity    Presence of permanent cardiac pacemaker    Rheumatic mitral valve failure    Unspecified atrial fibrillation [facility])     Past Surgical History:  Procedure Laterality Date   ABDOMINAL HYSTERECTOMY     APPENDECTOMY     CATARACT EXTRACTION W/PHACO Right 09/29/2016   Procedure: CATARACT EXTRACTION PHACO AND INTRAOCULAR LENS PLACEMENT ([place]);  Surgeon: Birder Robson, MD;  Location: [facility] ORS;  Service: Ophthalmology;  Laterality: Right;  [place] 00:46 AP% 18.9 CDE 8.69 Fluid pack lot # [id number]    CATARACT EXTRACTION W/PHACO Left 10/27/2016   Procedure: CATARACT EXTRACTION PHACO AND INTRAOCULAR LENS PLACEMENT ([facility]);  Surgeon: Birder Robson, MD;  Location: [facility] ORS;  Service: Ophthalmology;  Laterality: Left;  [place] 00:49 AP% 22.4 CDE 11.03 Fluid pack lot # [id number]   CHOLECYSTECTOMY  2008   COLONOSCOPY  2005, 2014   Dr Bary Castilla   FLEXIBLE SIGMOIDOSCOPY  2006   INSERT / REPLACE / REMOVE PACEMAKER     PACEMAKER INSERTION Left 08/21/2016   Procedure: PACEMAKER CHANGE OUT;  Surgeon: Marzetta Board, MD;  Location: [facility] ORS;  Service: Cardiovascular;  Laterality: Left;   PACEMAKER PLACEMENT  2008   salpingo oophorectmy      TONSILLECTOMY      Family History  Problem Relation Age of Onset   Hypertension Mother    Stroke Father    Heart disease Brother    Alcohol abuse Brother     Social History   Socioeconomic History   Marital status: Married    Spouse name: Not on file   Number of children: 1   Years of education: Not on file   Highest education level: 12th grade  Occupational History   Occupation: RETIRED  Tobacco Use   Smoking status: Never   Smokeless tobacco: Never  Vaping Use   Vaping Use: Never used  Substance and Sexual Activity   Alcohol use: No   Drug use: No   Sexual activity: Never  Other Topics Concern   Not on  file  Social History Narrative   Not on file   Social Determinants of Health   Financial Resource Strain: Not on file  Food Insecurity: Not on file  Transportation Needs: Not on file  Physical Activity: Not on file  Stress: Not on file  Social Connections: Not on file  Intimate Partner Violence: Not on file    Outpatient Medications Prior to Visit  Medication Sig Dispense Refill   Accu-Chek FastClix Lancets MISC USE TO CHECK GLUCOSE DAILY AND AS NEEDED 102 each 9   ACCU-CHEK GUIDE test strip USE AS DIRECTED 100 strip 11   acetaminophen (TYLENOL) 650 MG CR tablet Take 650 mg by mouth daily as needed for pain.     Blood Glucose  Monitoring Suppl (ACCU-CHEK GUIDE) w/Device KIT 1 each by Does not apply route daily. 1 kit 12   Calcium Carbonate-Vitamin D 600-400 MG-UNIT per tablet Take 1 tablet by mouth daily.      cetirizine (ZYRTEC) 10 MG tablet Take 10 mg by mouth at bedtime as needed for allergies.      Cholecalciferol (VITAMIN D3) 2000 units TABS Take by mouth.     enalapril (VASOTEC) 5 MG tablet TAKE 1 TABLET BY MOUTH EVERY DAY 90 tablet 1   FERROCITE 324 MG TABS tablet TAKE 1 TABLET BY MOUTH EVERY MORNING 90 tablet 7   fluticasone (FLONASE) 50 MCG/ACT nasal spray USE 2 SPRAYS IN EACH NOSTRIL ONCE DAILY (Patient taking differently: Place 1 spray into both nostrils as needed.) 16 g 12   Fluticasone-Salmeterol (ADVAIR) 100-50 MCG/DOSE AEPB Inhale 1 puff into the lungs 2 (two) times daily as needed.     gabapentin (NEURONTIN) 100 MG capsule Take 1 capsule (100 mg total) by mouth 2 (two) times daily. 60 capsule 4   Glucosamine-Chondroitin (OSTEO BI-FLEX REGULAR STRENGTH PO) Take by mouth daily. Twice a day     glucose blood (ACCU-CHEK GUIDE) test strip Use as instructed 100 each 12   glucose blood test strip Use to check glucose daily and as needed 100 each 11   Lancets Misc. (ACCU-CHEK SOFTCLIX LANCET DEV) KIT Dispense 1 kit 1 kit 11   meloxicam (MOBIC) 7.5 MG tablet TAKE 1 TABLET BY MOUTH EVERY DAY 30 tablet 0   metFORMIN (GLUCOPHAGE) 500 MG tablet TAKE 2 TABLETS BY MOUTH EVERY DAY 180 tablet 1   Multiple Vitamins-Minerals (CENTRUM SILVER) tablet Take 1 tablet by mouth daily.      omeprazole (PRILOSEC) 20 MG capsule TAKE 1 CAPSULE BY MOUTH EVERY DAY 90 capsule 0   pravastatin (PRAVACHOL) 40 MG tablet TAKE 1 TABLET BY MOUTH EVERY DAY AT BEDTIME 90 tablet 1   raloxifene (EVISTA) 60 MG tablet TAKE 1 TABLET(60 MG) BY MOUTH DAILY 90 tablet 0   WIXELA INHUB 100-50 MCG/DOSE AEPB INHALE 1 PUFF INTO THE LUNGS TWICE DAILY 180 each 1   XARELTO 20 MG TABS tablet TAKE 1 TABLET BY MOUTH EVERY DAY 90 tablet 2   No  facility-administered medications prior to visit.    Allergies  Allergen Reactions   Codeine Sulfate Other (See Comments)    Altered mental status   Codeine Other (See Comments)    Other Reaction: insomnia Unable to sleep, hypes her up    Review of Systems  Psychiatric/Behavioral:  Positive for agitation and sleep disturbance. The patient is nervous/anxious.       Objective:    Physical Exam Constitutional:      General: She is awake.     Appearance: She is well-developed.  HENT:     Head: Normocephalic.  Eyes:     Conjunctiva/sclera: Conjunctivae normal.  Cardiovascular:     Rate and Rhythm: Normal rate and regular rhythm.     Heart sounds: Normal heart sounds.  Pulmonary:     Effort: Pulmonary effort is normal.     Breath sounds: Normal breath sounds.  Skin:    General: Skin is warm.     Comments: Facial flushing  Neurological:     Mental Status: She is alert and oriented to person, place, and time.  Psychiatric:        Attention and Perception: Attention normal.        Mood and Affect: Mood normal.        Speech: Speech normal.        Behavior: Behavior is cooperative.    There were no vitals taken for this visit. Wt Readings from Last 3 Encounters:  04/09/21 158 lb 4.8 oz (71.8 kg)  02/07/21 160 lb 3.2 oz (72.7 kg)  02/02/21 155 lb (70.3 kg)    Health Maintenance Due  Topic Date Due   DEXA SCAN  08/02/2020   FOOT EXAM  10/29/2020   COVID-19 Vaccine (5 - Booster for Moderna series) 12/02/2020    There are no preventive care reminders to display for this patient.   Lab Results  Component Value Date   TSH 1.800 09/24/2020   Lab Results  Component Value Date   WBC 7.8 04/01/2021   HGB 13.0 04/01/2021   HCT 39.7 04/01/2021   MCV 93.4 04/01/2021   PLT 205 04/01/2021   Lab Results  Component Value Date   NA 133 (L) 04/01/2021   K 3.6 04/01/2021   CO2 26 04/01/2021   GLUCOSE 164 (H) 04/01/2021   BUN 14 04/01/2021   CREATININE 0.54 04/01/2021    BILITOT 0.4 04/01/2021   ALKPHOS 78 04/01/2021   AST 26 04/01/2021   ALT 15 04/01/2021   PROT 6.9 04/01/2021   ALBUMIN 4.2 04/01/2021   CALCIUM 9.1 04/01/2021   ANIONGAP 9 04/01/2021   EGFR 73 09/24/2020   Lab Results  Component Value Date   CHOL 154 09/24/2020   Lab Results  Component Value Date   HDL 76 09/24/2020   Lab Results  Component Value Date   LDLCALC 65 09/24/2020   Lab Results  Component Value Date   TRIG 64 09/24/2020   Lab Results  Component Value Date   CHOLHDL 2.0 09/24/2020   Lab Results  Component Value Date   HGBA1C 7.4 (A) 04/09/2021       Assessment & Plan:   Problem List Items Addressed This Visit       Other   Anxiety - Primary    With likely panic attacks secondary to life changes and physical wearing of sling.  Discussed ways to manage panic, self care tips. Reviewed anxiety and panic with the pt. Rx sertraline 25 mg daily. Advised if any SE of dizziness/lightheadedness to d/c medication and call office.       Relevant Medications   sertraline (ZOLOFT) 25 MG tablet   Dark stools    Pt sent home with OC lite kit, will bring back into the office. Low suspicion for GI bleed, but pt is on xarelto.           I, Taylor Kirschner, PA-C have reviewed all documentation for this visit. The documentation on  04/22/2021 for the exam, diagnosis, procedures, and orders are all accurate and complete.  Ria Comment  Minda Ditto [facility] [address] [place], [place], [address] Office: [phone] Fax: [phone]

## 2021-04-24 ENCOUNTER — Other Ambulatory Visit (INDEPENDENT_AMBULATORY_CARE_PROVIDER_SITE_OTHER): Payer: Medicare Other

## 2021-04-24 DIAGNOSIS — R195 Other fecal abnormalities: Secondary | ICD-10-CM

## 2021-04-24 DIAGNOSIS — R059 Cough, unspecified: Secondary | ICD-10-CM | POA: Diagnosis not present

## 2021-04-24 DIAGNOSIS — Z20822 Contact with and (suspected) exposure to covid-19: Secondary | ICD-10-CM | POA: Diagnosis not present

## 2021-04-24 DIAGNOSIS — R051 Acute cough: Secondary | ICD-10-CM | POA: Diagnosis not present

## 2021-04-24 LAB — IFOBT (OCCULT BLOOD): IFOBT: POSITIVE

## 2021-04-26 DIAGNOSIS — Z20822 Contact with and (suspected) exposure to covid-19: Secondary | ICD-10-CM | POA: Diagnosis not present

## 2021-04-30 DIAGNOSIS — S42255A Nondisplaced fracture of greater tuberosity of left humerus, initial encounter for closed fracture: Secondary | ICD-10-CM | POA: Diagnosis not present

## 2021-05-01 DIAGNOSIS — M25512 Pain in left shoulder: Secondary | ICD-10-CM | POA: Diagnosis not present

## 2021-05-03 DIAGNOSIS — Z20822 Contact with and (suspected) exposure to covid-19: Secondary | ICD-10-CM | POA: Diagnosis not present

## 2021-05-05 DIAGNOSIS — M25512 Pain in left shoulder: Secondary | ICD-10-CM | POA: Diagnosis not present

## 2021-05-07 ENCOUNTER — Other Ambulatory Visit: Payer: Self-pay | Admitting: Family Medicine

## 2021-05-07 DIAGNOSIS — E7800 Pure hypercholesterolemia, unspecified: Secondary | ICD-10-CM

## 2021-05-08 ENCOUNTER — Ambulatory Visit (INDEPENDENT_AMBULATORY_CARE_PROVIDER_SITE_OTHER): Payer: Medicare Other | Admitting: Family Medicine

## 2021-05-08 ENCOUNTER — Encounter: Payer: Self-pay | Admitting: Family Medicine

## 2021-05-08 ENCOUNTER — Other Ambulatory Visit: Payer: Self-pay

## 2021-05-08 VITALS — BP 155/59 | HR 73 | Resp 15 | Wt 160.1 lb

## 2021-05-08 DIAGNOSIS — R195 Other fecal abnormalities: Secondary | ICD-10-CM

## 2021-05-08 DIAGNOSIS — R609 Edema, unspecified: Secondary | ICD-10-CM

## 2021-05-08 DIAGNOSIS — F419 Anxiety disorder, unspecified: Secondary | ICD-10-CM

## 2021-05-08 DIAGNOSIS — K922 Gastrointestinal hemorrhage, unspecified: Secondary | ICD-10-CM

## 2021-05-08 DIAGNOSIS — I4819 Other persistent atrial fibrillation: Secondary | ICD-10-CM

## 2021-05-08 MED ORDER — FUROSEMIDE 20 MG PO TABS: 20.0000 mg | ORAL_TABLET | ORAL | 2 refills | Status: DC

## 2021-05-08 NOTE — Progress Notes (Signed)
?  ? ? ?Established patient visit ? ? ?Patient: Taylor Snyder   DOB: 1928-11-13   88 y.o. Female  MRN: [id number] ?Visit Date: 05/08/2021 ? ?Today's healthcare provider: [name], MD  ? ?Chief Complaint  ?Patient presents with  ? Follow-up  ?  Patient returns to office to discuss plan of care since OC lyte was reported positive on 04/23/21.   ?Patient's GI complaints have completely resolved. ?Her anxiety is better. ?To take Lasix for her lower extremity edema with no other symptoms. ?She does have some chronic constipation as an adult. ?Subjective  ?  ?HPI ?HPI   ? ? Follow-up   ? ?      ? Comments: Patient returns to office to discuss plan of care since OC lyte was reported positive on 04/23/21.   ? ?  ?  ?Last edited by Minette Headland, CMA on 05/08/2021 11:22 AM.  ?  ?  ?Patient is here to discuss results of positive OC lite. ? ?Medications: ?Outpatient Medications Prior to Visit  ?Medication Sig  ? Accu-Chek FastClix Lancets MISC USE TO CHECK GLUCOSE DAILY AND AS NEEDED  ? ACCU-CHEK GUIDE test strip USE AS DIRECTED  ? acetaminophen (TYLENOL) 650 MG CR tablet Take 650 mg by mouth daily as needed for pain.  ? Blood Glucose Monitoring Suppl (ACCU-CHEK GUIDE) w/Device KIT 1 each by Does not apply route daily.  ? Calcium Carbonate-Vitamin D 600-400 MG-UNIT per tablet Take 1 tablet by mouth daily.   ? cetirizine (ZYRTEC) 10 MG tablet Take 10 mg by mouth at bedtime as needed for allergies.   ? Cholecalciferol (VITAMIN D3) 2000 units TABS Take by mouth.  ? enalapril (VASOTEC) 5 MG tablet TAKE 1 TABLET BY MOUTH EVERY DAY  ? FERROCITE 324 MG TABS tablet TAKE 1 TABLET BY MOUTH EVERY MORNING  ? fluticasone (FLONASE) 50 MCG/ACT nasal spray USE 2 SPRAYS IN EACH NOSTRIL ONCE DAILY (Patient taking differently: Place 1 spray into both nostrils as needed.)  ? Fluticasone-Salmeterol (ADVAIR) 100-50 MCG/DOSE AEPB Inhale 1 puff into the lungs 2 (two) times daily as needed.  ? Glucosamine-Chondroitin (OSTEO BI-FLEX REGULAR  STRENGTH PO) Take by mouth daily. Twice a day  ? glucose blood (ACCU-CHEK GUIDE) test strip Use as instructed  ? glucose blood test strip Use to check glucose daily and as needed  ? Lancets Misc. (ACCU-CHEK SOFTCLIX LANCET DEV) KIT Dispense 1 kit  ? meloxicam (MOBIC) 7.5 MG tablet TAKE 1 TABLET BY MOUTH EVERY DAY  ? metFORMIN (GLUCOPHAGE) 500 MG tablet TAKE 2 TABLETS BY MOUTH EVERY DAY  ? Multiple Vitamins-Minerals (CENTRUM SILVER) tablet Take 1 tablet by mouth daily.   ? omeprazole (PRILOSEC) 20 MG capsule TAKE 1 CAPSULE BY MOUTH EVERY DAY  ? pravastatin (PRAVACHOL) 40 MG tablet TAKE 1 TABLET EVERY NIGHT AT BEDTIME  ? raloxifene (EVISTA) 60 MG tablet TAKE 1 TABLET(60 MG) BY MOUTH DAILY  ? sertraline (ZOLOFT) 25 MG tablet Take 1 tablet (25 mg total) by mouth daily.  ? WIXELA INHUB 100-50 MCG/DOSE AEPB INHALE 1 PUFF INTO THE LUNGS TWICE DAILY  ? XARELTO 20 MG TABS tablet TAKE 1 TABLET BY MOUTH EVERY DAY  ? ?No facility-administered medications prior to visit.  ? ? ?Review of Systems  ?Constitutional:  Negative for appetite change, chills, fatigue and fever.  ?Respiratory:  Negative for chest tightness and shortness of breath.   ?Cardiovascular:  Negative for chest pain and palpitations.  ?Gastrointestinal:  Negative for abdominal pain, nausea and vomiting.  ?  Neurological:  Negative for dizziness and weakness.  ? ?Last CBC ?Lab Results  ?Component Value Date  ? WBC 7.9 05/08/2021  ? HGB 11.2 05/08/2021  ? HCT 32.9 (L) 05/08/2021  ? MCV 93 05/08/2021  ? MCH 31.7 05/08/2021  ? RDW 13.1 05/08/2021  ? PLT 197 05/08/2021  ? ?  ?  Objective  ?  ?BP (!) 155/59   Pulse 73   Resp 15   Wt 160 lb 1.6 oz (72.6 kg)   SpO2 98%   BMI 28.36 kg/m?  ?BP Readings from Last 3 Encounters:  ?05/08/21 (!) 155/59  ?04/22/21 (!) 137/45  ?04/09/21 129/64  ? ?Wt Readings from Last 3 Encounters:  ?05/08/21 160 lb 1.6 oz (72.6 kg)  ?04/22/21 156 lb 1.6 oz (70.8 kg)  ?04/09/21 158 lb 4.8 oz (71.8 kg)  ? ?  ? ?Physical Exam ?Vitals reviewed.   ?Constitutional:   ?   Appearance: Normal appearance. She is well-developed and normal weight.  ?HENT:  ?   Head: Normocephalic and atraumatic.  ?   Right Ear: External ear normal.  ?   Left Ear: External ear normal.  ?   Nose: Nose normal.  ?   Mouth/Throat:  ?   Pharynx: Oropharynx is clear.  ?Eyes:  ?   General: No scleral icterus. ?   Conjunctiva/sclera: Conjunctivae normal.  ?Neck:  ?   Thyroid: No thyromegaly.  ?   Vascular: No carotid bruit.  ?Cardiovascular:  ?   Rate and Rhythm: Normal rate and regular rhythm.  ?   Heart sounds: Normal heart sounds.  ?Pulmonary:  ?   Effort: Pulmonary effort is normal.  ?   Breath sounds: Normal breath sounds.  ?Abdominal:  ?   Palpations: Abdomen is soft.  ?Musculoskeletal:  ?   Right lower leg: Edema present.  ?   Left lower leg: Edema present.  ?   Comments: Trace lower extremity edema and lymphedema  ?Skin: ?   General: Skin is warm and dry.  ?Neurological:  ?   General: No focal deficit present.  ?   Mental Status: She is alert and oriented to person, place, and time.  ?Psychiatric:     ?   Mood and Affect: Mood normal.     ?   Behavior: Behavior normal.     ?   Thought Content: Thought content normal.     ?   Judgment: Judgment normal.  ?  ? ? ?No results found for any visits on 05/08/21. ? Assessment & Plan  ?  ? ?1. Gastrointestinal hemorrhage, unspecified gastrointestinal hemorrhage type ?Patient symptoms in recent positive OC light. ?After discussion with patient and her daughter we will follow-up CBC and repeat that in a few weeks.  C and iron panel are normal or stable and patient is continuing to be asymptomatic would not pursue this. ?This is a most reasonable approach and 88 year old. More than 50% 25 minute visit spent in counseling or coordination of care ? ?- furosemide (LASIX) 20 MG tablet; Take 1 tablet (20 mg total) by mouth every morning. AS NEEDED. TAKE NO MORE THAN 2 DAYS IN A ROW.  Dispense: 30 tablet; Refill: 2 ?- Renal function panel ?- CBC  w/Diff/Platelet ?- Iron, TIBC and Ferritin Panel ? ?2. Dark stools ?.  Meloxicam. ? ?3. Edema, unspecified type ?Take Lasix not more than 2 days in a week. ?- furosemide (LASIX) 20 MG tablet; Take 1 tablet (20 mg total) by mouth every morning. AS NEEDED. TAKE  NO MORE THAN 2 DAYS IN A ROW.  Dispense: 30 tablet; Refill: 2 ? ?4. Anxiety ?Improved ? ?5. Persistent atrial fibrillation ([place]) ?On Eliquis. ? ? ?No follow-ups on file.  ?   ? ?I, [name], MD, have reviewed all documentation for this visit. The documentation on 05/09/21 for the exam, diagnosis, procedures, and orders are all accurate and complete. ? ? ? ?Katalea Ucci Cranford Mon, MD  ?[facility] ?[phone] (phone) ?[phone] (fax) ? ?[facility] ?

## 2021-05-08 NOTE — Patient Instructions (Signed)
STOP MELOXICAM!! TRY COLACE DAILY FOR CONSTIPATION. IF NO RELIEF AFTER 2 WEEKS ADD GLYCOLAX/MIROLAX. ?

## 2021-05-09 LAB — RENAL FUNCTION PANEL
Albumin: 3.8 g/dL (ref 3.5–4.6)
BUN/Creatinine Ratio: 19 (ref 12–28)
BUN: 14 mg/dL (ref 10–36)
CO2: 23 mmol/L (ref 20–29)
Calcium: 9.1 mg/dL (ref 8.7–10.3)
Chloride: 94 mmol/L — ABNORMAL LOW (ref 96–106)
Creatinine, Ser: 0.72 mg/dL (ref 0.57–1.00)
Glucose: 195 mg/dL — ABNORMAL HIGH (ref 70–99)
Phosphorus: 3.6 mg/dL (ref 3.0–4.3)
Potassium: 4.5 mmol/L (ref 3.5–5.2)
Sodium: 130 mmol/L — ABNORMAL LOW (ref 134–144)
eGFR: 79 mL/min/{1.73_m2} (ref >59)

## 2021-05-09 LAB — IRON,TIBC AND FERRITIN PANEL
Ferritin: 43 ng/mL (ref 15–150)
Iron Saturation: 15 % (ref 15–55)
Iron: 49 ug/dL (ref 27–139)
Total Iron Binding Capacity: 320 ug/dL (ref 250–450)
UIBC: 271 ug/dL (ref 118–369)

## 2021-05-09 LAB — CBC WITH DIFFERENTIAL/PLATELET
Basophils Absolute: 0 10*3/uL (ref 0.0–0.2)
Basos: 0 %
EOS (ABSOLUTE): 0.1 10*3/uL (ref 0.0–0.4)
Eos: 1 %
Hematocrit: 32.9 % — ABNORMAL LOW (ref 34.0–46.6)
Hemoglobin: 11.2 g/dL (ref 11.1–15.9)
Immature Grans (Abs): 0 10*3/uL (ref 0.0–0.1)
Immature Granulocytes: 0 %
Lymphocytes Absolute: 2.5 10*3/uL (ref 0.7–3.1)
Lymphs: 32 %
MCH: 31.7 pg (ref 26.6–33.0)
MCHC: 34 g/dL (ref 31.5–35.7)
MCV: 93 fL (ref 79–97)
Monocytes Absolute: 0.7 10*3/uL (ref 0.1–0.9)
Monocytes: 9 %
Neutrophils Absolute: 4.6 10*3/uL (ref 1.4–7.0)
Neutrophils: 58 %
Platelets: 197 10*3/uL (ref 150–450)
RBC: 3.53 x10E6/uL — ABNORMAL LOW (ref 3.77–5.28)
RDW: 13.1 % (ref 11.7–15.4)
WBC: 7.9 10*3/uL (ref 3.4–10.8)

## 2021-05-19 ENCOUNTER — Ambulatory Visit: Payer: Self-pay

## 2021-05-19 ENCOUNTER — Emergency Department: Payer: Medicare Other

## 2021-05-19 ENCOUNTER — Emergency Department
Admission: EM | Admit: 2021-05-19 | Discharge: 2021-05-19 | Disposition: A | Discharge status: Home / Self Care | Payer: Medicare Other | Attending: Emergency Medicine | Admitting: Emergency Medicine

## 2021-05-19 ENCOUNTER — Other Ambulatory Visit: Payer: Self-pay

## 2021-05-19 DIAGNOSIS — M542 Cervicalgia: Secondary | ICD-10-CM | POA: Diagnosis not present

## 2021-05-19 DIAGNOSIS — Z7901 Long term (current) use of anticoagulants: Secondary | ICD-10-CM | POA: Insufficient documentation

## 2021-05-19 DIAGNOSIS — W01198A Fall on same level from slipping, tripping and stumbling with subsequent striking against other object, initial encounter: Secondary | ICD-10-CM | POA: Diagnosis not present

## 2021-05-19 DIAGNOSIS — E871 Hypo-osmolality and hyponatremia: Secondary | ICD-10-CM | POA: Insufficient documentation

## 2021-05-19 DIAGNOSIS — I10 Essential (primary) hypertension: Secondary | ICD-10-CM | POA: Diagnosis not present

## 2021-05-19 DIAGNOSIS — I4891 Unspecified atrial fibrillation: Secondary | ICD-10-CM | POA: Diagnosis not present

## 2021-05-19 DIAGNOSIS — S0003XA Contusion of scalp, initial encounter: Secondary | ICD-10-CM | POA: Diagnosis not present

## 2021-05-19 DIAGNOSIS — W19XXXA Unspecified fall, initial encounter: Secondary | ICD-10-CM

## 2021-05-19 DIAGNOSIS — S0083XA Contusion of other part of head, initial encounter: Secondary | ICD-10-CM | POA: Insufficient documentation

## 2021-05-19 DIAGNOSIS — R519 Headache, unspecified: Secondary | ICD-10-CM | POA: Diagnosis not present

## 2021-05-19 DIAGNOSIS — M25512 Pain in left shoulder: Secondary | ICD-10-CM | POA: Diagnosis not present

## 2021-05-19 DIAGNOSIS — M7989 Other specified soft tissue disorders: Secondary | ICD-10-CM | POA: Diagnosis not present

## 2021-05-19 DIAGNOSIS — S0993XA Unspecified injury of face, initial encounter: Secondary | ICD-10-CM | POA: Diagnosis present

## 2021-05-19 LAB — CBC WITH DIFFERENTIAL/PLATELET
Abs Immature Granulocytes: 0.03 10*3/uL (ref 0.00–0.07)
Basophils Absolute: 0 10*3/uL (ref 0.0–0.1)
Basophils Relative: 0 %
Eosinophils Absolute: 0 10*3/uL (ref 0.0–0.5)
Eosinophils Relative: 1 %
HCT: 35.5 % — ABNORMAL LOW (ref 36.0–46.0)
Hemoglobin: 11.7 g/dL — ABNORMAL LOW (ref 12.0–15.0)
Immature Granulocytes: 0 %
Lymphocytes Relative: 29 %
Lymphs Abs: 2.1 10*3/uL (ref 0.7–4.0)
MCH: 31 pg (ref 26.0–34.0)
MCHC: 33 g/dL (ref 30.0–36.0)
MCV: 94.2 fL (ref 80.0–100.0)
Monocytes Absolute: 0.8 10*3/uL (ref 0.1–1.0)
Monocytes Relative: 12 %
Neutro Abs: 4.2 10*3/uL (ref 1.7–7.7)
Neutrophils Relative %: 58 %
Platelets: 253 10*3/uL (ref 150–400)
RBC: 3.77 MIL/uL — ABNORMAL LOW (ref 3.87–5.11)
RDW: 14.6 % (ref 11.5–15.5)
WBC: 7.3 10*3/uL (ref 4.0–10.5)
nRBC: 0 % (ref 0.0–0.2)

## 2021-05-19 LAB — COMPREHENSIVE METABOLIC PANEL
ALT: 16 U/L (ref 0–44)
AST: 19 U/L (ref 15–41)
Albumin: 3.8 g/dL (ref 3.5–5.0)
Alkaline Phosphatase: 77 U/L (ref 38–126)
Anion gap: 10 (ref 5–15)
BUN: 11 mg/dL (ref 8–23)
CO2: 24 mmol/L (ref 22–32)
Calcium: 8.7 mg/dL — ABNORMAL LOW (ref 8.9–10.3)
Chloride: 97 mmol/L — ABNORMAL LOW (ref 98–111)
Creatinine, Ser: 0.52 mg/dL (ref 0.44–1.00)
GFR, Estimated: >60 mL/min (ref >60)
Glucose, Bld: 132 mg/dL — ABNORMAL HIGH (ref 70–99)
Potassium: 4.1 mmol/L (ref 3.5–5.1)
Sodium: 131 mmol/L — ABNORMAL LOW (ref 135–145)
Total Bilirubin: 0.5 mg/dL (ref 0.3–1.2)
Total Protein: 6.8 g/dL (ref 6.5–8.1)

## 2021-05-19 MED ORDER — MUPIROCIN 2 % EX OINT: 1.0000 "application " | TOPICAL_OINTMENT | Freq: Two times a day (BID) | CUTANEOUS | 0 refills | Status: AC

## 2021-05-19 NOTE — Telephone Encounter (Signed)
? ? ?  Chief Complaint: Pt. Fell yesterday, hit forehead.Has hematoma to forehead. On blood thinner. ?Symptoms: No LOC ?Frequency: Yesterday ?Pertinent Negatives: Patient denies any pain  ?Disposition: '[x]'$ ED /'[]'$ Urgent Care (no appt availability in office) / '[]'$ Appointment(In office/virtual)/ '[]'$  [place] [facility]/ '[]'$ Home Care/ '[]'$ Refused Recommended Disposition /'[]'$ [place] [facility]/ '[]'$  Follow-up with PCP ?Additional Notes: Warm transfer of daughter to [place] in the practice.  ?Reason for Disposition ? Sounds like a serious injury to the triager ? ?Answer Assessment - Initial Assessment Questions ?1. MECHANISM: "How did the fall happen?" ?    Fell Sunday ?2. DOMESTIC VIOLENCE AND ELDER ABUSE SCREENING: "Did you fall because someone pushed you or tried to hurt you?" If Yes, ask: "Are you safe now?" ?    No ?3. ONSET: "When did the fall happen?" (e.g., minutes, hours, or days ago) ?    Sunday ?4. LOCATION: "What part of the body hit the ground?" (e.g., back, buttocks, head, hips, knees, hands, head, stomach) ?    Face ?5. INJURY: "Did you hurt (injure) yourself when you fell?" If Yes, ask: "What did you injure? Tell me more about this?" (e.g., body area; type of injury; pain severity)" ?    Bruise to forehead ?6. PAIN: "Is there any pain?" If Yes, ask: "How bad is the pain?" (e.g., Scale 1-10; or mild,  ?moderate, severe) ?  - NONE (0): No pain ?  - MILD (1-3): Doesn't interfere with normal activities  ?  - MODERATE (4-7): Interferes with normal activities or awakens from sleep  ?  - SEVERE (8-10): Excruciating pain, unable to do any normal activities  ?    No ?7. SIZE: For cuts, bruises, or swelling, ask: "How large is it?" (e.g., inches or centimeters)  ?    Fifty cent piece ?8. PREGNANCY: "Is there any chance you are pregnant?" "When was your last menstrual period?" ?    No ?9. OTHER SYMPTOMS: "Do you have any other symptoms?" (e.g., dizziness, fever, weakness; new onset or worsening).  ?    No ?10. CAUSE:  "What do you think caused the fall (or falling)?" (e.g., tripped, dizzy spell) ?      Tripped ? ?Protocols used: Falls and Falling-A-AH ? ?

## 2021-05-19 NOTE — ED Provider Notes (Signed)
? ?[facility] ?Provider Note ? ?Patient Contact: 6:45 PM (approximate) ? ? ?History  ? ?Fall ? ? ?HPI ? ?Taylor Snyder is a 88 y.o. female presents to the emergency department after patient had a mechanical fall.  Patient walks approximately quarter mile every day and just finished her walk when she bent down to pick something up.  She states that she lost her balance and fell forward and struck her face.  She did not lose consciousness.  Patient states that she currently takes Xarelto for atrial fibrillation.  She lives independently with her 86 year old husband.  She denies current pain.  She states that she has been applying Vicks vapor rub to abrasion along her forehead.  She denies chest pain, chest tightness or abdominal pain. ? ?  ? ? ?Physical Exam  ? ?Triage Vital Signs: ?ED Triage Vitals  ?Enc Vitals Group  ?   BP 05/19/21 1439 (!) 173/80  ?   Pulse Rate 05/19/21 1439 74  ?   Resp 05/19/21 1439 19  ?   Temp 05/19/21 1439 98 ?F (36.7 ?C)  ?   Temp src --   ?   SpO2 05/19/21 1439 97 %  ?   Weight --   ?   Height --   ?   Head Circumference --   ?   Peak Flow --   ?   Pain Score 05/19/21 1438 5  ?   Pain Loc --   ?   Pain Edu? --   ?   Excl. in [place]? --   ? ? ?Most recent vital signs: ?Vitals:  ? 05/19/21 1439 05/19/21 1847  ?BP: (!) 173/80 (!) 168/78  ?Pulse: 74 70  ?Resp: 19 18  ?Temp: 98 ?F (36.7 ?C)   ?SpO2: 97%   ? ? ? ?General: Alert and in no acute distress. ?Eyes:  PERRL. EOMI. ?Head: Patient has facial ecchymosis and abrasion of forehead. ?ENT: ?     Nose: No congestion/rhinnorhea. ?     Mouth/Throat: Mucous membranes are moist. ?Neck: No stridor. No cervical spine tenderness to palpation. ?Cardiovascular:  Good peripheral perfusion ?Respiratory: Normal respiratory effort without tachypnea or retractions. Lungs CTAB. Good air entry to the bases with no decreased or absent breath sounds. ?Gastrointestinal: Bowel sounds ?4 quadrants. Soft and nontender to palpation. No guarding or  rigidity. No palpable masses. No distention. No CVA tenderness. ?Musculoskeletal: Full range of motion to all extremities.  ?Neurologic:  No gross focal neurologic deficits are appreciated.  ?Skin:   No rash noted ?Other: ? ? ?ED Results / Procedures / Treatments  ? ?Labs ?(all labs ordered are listed, but only abnormal results are displayed) ?Labs Reviewed  ?CBC WITH DIFFERENTIAL/PLATELET - Abnormal; Notable for the following components:  ?    Result Value  ? RBC 3.77 (*)   ? Hemoglobin 11.7 (*)   ? HCT 35.5 (*)   ? All other components within normal limits  ?COMPREHENSIVE METABOLIC PANEL - Abnormal; Notable for the following components:  ? Sodium 131 (*)   ? Chloride 97 (*)   ? Glucose, Bld 132 (*)   ? Calcium 8.7 (*)   ? All other components within normal limits  ?URINALYSIS, COMPLETE (UACMP) WITH MICROSCOPIC  ? ? ? ? ? ?RADIOLOGY ? ?I personally viewed and evaluated these images as part of my medical decision making, as well as reviewing the written report by the radiologist. ? ?ED Provider Interpretation: I personally reviewed CTs of the head,  face and cervical spine and there was no evidence of intracranial bleed or skull fracture. ? ? ?PROCEDURES: ? ?Critical Care performed: No ? ?Procedures ? ? ?MEDICATIONS ORDERED IN ED: ?Medications - No data to display ? ? ?IMPRESSION / MDM / ASSESSMENT AND PLAN / ED COURSE  ?I reviewed the triage vital signs and the nursing notes. ?             ?               ? ?Assessment and plan ?Facial Bruising  ?Differential diagnosis includes, but is not limited to, electrolyte abnormality, facial fracture, UTI... ? ?88 year old female presents to the emergency department with facial bruising after patient had tipped forward while trying to pick something up from the ground. ? ?Patient was mildly hypertensive at triage but vital signs otherwise reassuring.  She was alert, active and nontoxic. ?CMP indicated mild hyponatremia which is consistent with patient's baseline.  CBC within  reference range.  CT head, max face and cervical spine showed no acute abnormality.  Tylenol was recommended for discomfort and return precautions were given to return with new or worsening symptoms. ? ?  ? ? ?FINAL CLINICAL IMPRESSION(S) / ED DIAGNOSES  ? ?Final diagnoses:  ?Fall, initial encounter  ? ? ? ?Rx / DC Orders  ? ?ED Discharge Orders   ? ?      Ordered  ?  mupirocin ointment (BACTROBAN) 2 %  2 times daily       ? 05/19/21 2011  ? ?  ?  ? ?  ? ? ? ?Note:  This document was prepared using Dragon voice recognition software and may include unintentional dictation errors. ?  ?Lannie Fields, PA-C ?05/19/21 2305 ? ?  Delman Kitten, MD ?05/19/21 2328 ? ?

## 2021-05-19 NOTE — ED Triage Notes (Addendum)
Pt comes with c/o trip and fall yesterday. Pt state she bent down and fell forward. Pt is on blood thinners. Pt states pain to face. No loc. ? ?Pt has large goosegg to forehead, swelling and bruising noted to bilateral eyes and cheeks. Pt states pain is 5/10. ?

## 2021-05-22 DIAGNOSIS — L821 Other seborrheic keratosis: Secondary | ICD-10-CM | POA: Diagnosis not present

## 2021-05-22 DIAGNOSIS — S0083XA Contusion of other part of head, initial encounter: Secondary | ICD-10-CM | POA: Diagnosis not present

## 2021-05-22 DIAGNOSIS — D2261 Melanocytic nevi of right upper limb, including shoulder: Secondary | ICD-10-CM | POA: Diagnosis not present

## 2021-05-22 DIAGNOSIS — D225 Melanocytic nevi of trunk: Secondary | ICD-10-CM | POA: Diagnosis not present

## 2021-05-22 DIAGNOSIS — D2262 Melanocytic nevi of left upper limb, including shoulder: Secondary | ICD-10-CM | POA: Diagnosis not present

## 2021-05-22 DIAGNOSIS — M25512 Pain in left shoulder: Secondary | ICD-10-CM | POA: Diagnosis not present

## 2021-05-26 DIAGNOSIS — M25512 Pain in left shoulder: Secondary | ICD-10-CM | POA: Diagnosis not present

## 2021-05-27 DIAGNOSIS — Z20822 Contact with and (suspected) exposure to covid-19: Secondary | ICD-10-CM | POA: Diagnosis not present

## 2021-05-28 DIAGNOSIS — M25512 Pain in left shoulder: Secondary | ICD-10-CM | POA: Diagnosis not present

## 2021-05-30 DIAGNOSIS — M25512 Pain in left shoulder: Secondary | ICD-10-CM | POA: Diagnosis not present

## 2021-06-02 DIAGNOSIS — Z20822 Contact with and (suspected) exposure to covid-19: Secondary | ICD-10-CM | POA: Diagnosis not present

## 2021-06-02 DIAGNOSIS — M25512 Pain in left shoulder: Secondary | ICD-10-CM | POA: Diagnosis not present

## 2021-06-06 DIAGNOSIS — M25512 Pain in left shoulder: Secondary | ICD-10-CM | POA: Diagnosis not present

## 2021-06-09 DIAGNOSIS — Z20828 Contact with and (suspected) exposure to other viral communicable diseases: Secondary | ICD-10-CM | POA: Diagnosis not present

## 2021-06-16 DIAGNOSIS — B351 Tinea unguium: Secondary | ICD-10-CM | POA: Diagnosis not present

## 2021-06-16 DIAGNOSIS — E1142 Type 2 diabetes mellitus with diabetic polyneuropathy: Secondary | ICD-10-CM | POA: Diagnosis not present

## 2021-06-17 ENCOUNTER — Ambulatory Visit: Payer: Medicare Other | Admitting: Physician Assistant

## 2021-06-21 DIAGNOSIS — Z20822 Contact with and (suspected) exposure to covid-19: Secondary | ICD-10-CM | POA: Diagnosis not present

## 2021-06-24 DIAGNOSIS — Z20822 Contact with and (suspected) exposure to covid-19: Secondary | ICD-10-CM | POA: Diagnosis not present

## 2021-07-02 ENCOUNTER — Other Ambulatory Visit: Payer: Self-pay | Admitting: Family Medicine

## 2021-07-02 DIAGNOSIS — M810 Age-related osteoporosis without current pathological fracture: Secondary | ICD-10-CM

## 2021-07-09 ENCOUNTER — Telehealth: Payer: Self-pay

## 2021-07-09 NOTE — Telephone Encounter (Signed)
Copied from [place]. Topic: Appointment Scheduling - Scheduling Inquiry for Clinic >> Jul 09, 2021 12:39 PM Pawlus, Brayton Layman A wrote: Reason for CRM: Pt called in needing to cancel her telephone visit today at 1:30pm, pt would like to reschedule, please advise.

## 2021-07-10 ENCOUNTER — Other Ambulatory Visit: Payer: Self-pay | Admitting: Family Medicine

## 2021-07-10 DIAGNOSIS — I10 Essential (primary) hypertension: Secondary | ICD-10-CM

## 2021-07-15 NOTE — Telephone Encounter (Signed)
Patient returned call and stated she needed a smart phone for her telephone medicare wellness appointment scheduled for 07/16/2021. Informed patient she would received a telephone call from the health advisor and no smart phone is needed.

## 2021-07-16 ENCOUNTER — Ambulatory Visit (INDEPENDENT_AMBULATORY_CARE_PROVIDER_SITE_OTHER): Payer: Medicare Other

## 2021-07-16 VITALS — Wt 160.0 lb

## 2021-07-16 DIAGNOSIS — Z0000 Encounter for general adult medical examination without abnormal findings: Secondary | ICD-10-CM

## 2021-07-16 NOTE — Patient Instructions (Signed)
Taylor Snyder , Thank you for taking time to come for your Medicare Wellness Visit. I appreciate your ongoing commitment to your health goals. Please review the following plan we discussed and let me know if I can assist you in the future.   Screening recommendations/referrals: Colonoscopy: aged out Mammogram: aged out Bone Density: aged out Recommended yearly ophthalmology/optometry visit for glaucoma screening and checkup Recommended yearly dental visit for hygiene and checkup  Vaccinations: Influenza vaccine: 12/03/20 Pneumococcal vaccine: 07/19/13 Tdap vaccine: 04/09/21 Shingles vaccine: Zostavax 08/08/12   Shingrix 12/09/20, 03/24/21   Covid-19:04/04/19, 05/02/19, 01/18/20, 10/07/20  Advanced directives: no  Conditions/risks identified: none  Next appointment: Follow up in one year for your annual wellness visit - declined   Preventive Care 28 Years and Older, Female Preventive care refers to lifestyle choices and visits with your health care provider that can promote health and wellness. What does preventive care include? A yearly physical exam. This is also called an annual well check. Dental exams once or twice a year. Routine eye exams. Ask your health care provider how often you should have your eyes checked. Personal lifestyle choices, including: Daily care of your teeth and gums. Regular physical activity. Eating a healthy diet. Avoiding tobacco and drug use. Limiting alcohol use. Practicing safe sex. Taking low-dose aspirin every day. Taking vitamin and mineral supplements as recommended by your health care provider. What happens during an annual well check? The services and screenings done by your health care provider during your annual well check will depend on your age, overall health, lifestyle risk factors, and family history of disease. Counseling  Your health care provider may ask you questions about your: Alcohol use. Tobacco use. Drug use. Emotional  well-being. Home and relationship well-being. Sexual activity. Eating habits. History of falls. Memory and ability to understand (cognition). Work and work Statistician. Reproductive health. Screening  You may have the following tests or measurements: Height, weight, and BMI. Blood pressure. Lipid and cholesterol levels. These may be checked every 5 years, or more frequently if you are over 44 years old. Skin check. Lung cancer screening. You may have this screening every year starting at age 1 if you have a 30-pack-year history of smoking and currently smoke or have quit within the past 15 years. Fecal occult blood test (FOBT) of the stool. You may have this test every year starting at age 36. Flexible sigmoidoscopy or colonoscopy. You may have a sigmoidoscopy every 5 years or a colonoscopy every 10 years starting at age 69. Hepatitis C blood test. Hepatitis B blood test. Sexually transmitted disease (STD) testing. Diabetes screening. This is done by checking your blood sugar (glucose) after you have not eaten for a while (fasting). You may have this done every 1-3 years. Bone density scan. This is done to screen for osteoporosis. You may have this done starting at age 22. Mammogram. This may be done every 1-2 years. Talk to your health care provider about how often you should have regular mammograms. Talk with your health care provider about your test results, treatment options, and if necessary, the need for more tests. Vaccines  Your health care provider may recommend certain vaccines, such as: Influenza vaccine. This is recommended every year. Tetanus, diphtheria, and acellular pertussis (Tdap, Td) vaccine. You may need a Td booster every 10 years. Zoster vaccine. You may need this after age 9. Pneumococcal 13-valent conjugate (PCV13) vaccine. One dose is recommended after age 43. Pneumococcal polysaccharide (PPSV23) vaccine. One dose is recommended after age  64. Talk to your  health care provider about which screenings and vaccines you need and how often you need them. This information is not intended to replace advice given to you by your health care provider. Make sure you discuss any questions you have with your health care provider. Document Released: 03/01/2015 Document Revised: 10/23/2015 Document Reviewed: 12/04/2014 Elsevier Interactive Patient Education  2017 [place] Prevention in the Home Falls can cause injuries. They can happen to people of all ages. There are many things you can do to make your home safe and to help prevent falls. What can I do on the outside of my home? Regularly fix the edges of walkways and driveways and fix any cracks. Remove anything that might make you trip as you walk through a door, such as a raised step or threshold. Trim any bushes or trees on the path to your home. Use bright outdoor lighting. Clear any walking paths of anything that might make someone trip, such as rocks or tools. Regularly check to see if handrails are loose or broken. Make sure that both sides of any steps have handrails. Any raised decks and porches should have guardrails on the edges. Have any leaves, snow, or ice cleared regularly. Use sand or salt on walking paths during winter. Clean up any spills in your garage right away. This includes oil or grease spills. What can I do in the bathroom? Use night lights. Install grab bars by the toilet and in the tub and shower. Do not use towel bars as grab bars. Use non-skid mats or decals in the tub or shower. If you need to sit down in the shower, use a plastic, non-slip stool. Keep the floor dry. Clean up any water that spills on the floor as soon as it happens. Remove soap buildup in the tub or shower regularly. Attach bath mats securely with double-sided non-slip rug tape. Do not have throw rugs and other things on the floor that can make you trip. What can I do in the bedroom? Use night  lights. Make sure that you have a light by your bed that is easy to reach. Do not use any sheets or blankets that are too big for your bed. They should not hang down onto the floor. Have a firm chair that has side arms. You can use this for support while you get dressed. Do not have throw rugs and other things on the floor that can make you trip. What can I do in the kitchen? Clean up any spills right away. Avoid walking on wet floors. Keep items that you use a lot in easy-to-reach places. If you need to reach something above you, use a strong step stool that has a grab bar. Keep electrical cords out of the way. Do not use floor polish or wax that makes floors slippery. If you must use wax, use non-skid floor wax. Do not have throw rugs and other things on the floor that can make you trip. What can I do with my stairs? Do not leave any items on the stairs. Make sure that there are handrails on both sides of the stairs and use them. Fix handrails that are broken or loose. Make sure that handrails are as long as the stairways. Check any carpeting to make sure that it is firmly attached to the stairs. Fix any carpet that is loose or worn. Avoid having throw rugs at the top or bottom of the stairs. If you do have  throw rugs, attach them to the floor with carpet tape. Make sure that you have a light switch at the top of the stairs and the bottom of the stairs. If you do not have them, ask someone to add them for you. What else can I do to help prevent falls? Wear shoes that: Do not have high heels. Have rubber bottoms. Are comfortable and fit you well. Are closed at the toe. Do not wear sandals. If you use a stepladder: Make sure that it is fully opened. Do not climb a closed stepladder. Make sure that both sides of the stepladder are locked into place. Ask someone to hold it for you, if possible. Clearly mark and make sure that you can see: Any grab bars or handrails. First and last  steps. Where the edge of each step is. Use tools that help you move around (mobility aids) if they are needed. These include: Canes. Walkers. Scooters. Crutches. Turn on the lights when you go into a dark area. Replace any light bulbs as soon as they burn out. Set up your furniture so you have a clear path. Avoid moving your furniture around. If any of your floors are uneven, fix them. If there are any pets around you, be aware of where they are. Review your medicines with your doctor. Some medicines can make you feel dizzy. This can increase your chance of falling. Ask your doctor what other things that you can do to help prevent falls. This information is not intended to replace advice given to you by your health care provider. Make sure you discuss any questions you have with your health care provider. Document Released: 11/29/2008 Document Revised: 07/11/2015 Document Reviewed: 03/09/2014 Elsevier Interactive Patient Education  2017 [employer].

## 2021-07-16 NOTE — Progress Notes (Signed)
Virtual Visit via Telephone Note  I connected with  Taylor Snyder on 07/16/21 at 10:00 AM EDT by telephone and verified that I am speaking with the correct person using two identifiers.  Location: Patient: home Provider: [facility] Persons participating in the virtual visit: [place]   I discussed the limitations, risks, security and privacy concerns of performing an evaluation and management service by telephone and the availability of in person appointments. The patient expressed understanding and agreed to proceed.  Interactive audio and video telecommunications were attempted between this nurse and patient, however failed, due to patient having technical difficulties OR patient did not have access to video capability.  We continued and completed visit with audio only.  Some vital signs may be absent or patient reported.   Taylor David, LPN  Subjective:   Taylor Snyder is a 88 y.o. female who presents for Medicare Annual (Subsequent) preventive examination.  Review of Systems           Objective:    There were no vitals filed for this visit. There is no height or weight on file to calculate BMI.     05/19/2021    2:42 PM 04/01/2021    6:28 PM 02/02/2021    4:01 PM 03/27/2020    1:43 PM 03/20/2019    2:22 PM 03/15/2018    2:18 PM 03/04/2017    8:51 AM  Advanced Directives  Does Patient Have a Medical Advance Directive? No No No No No No No  Would patient like information on creating a medical advance directive?    No - Patient declined Yes (ED - Information included in AVS) No - Patient declined No - Patient declined    Current Medications (verified) Outpatient Encounter Medications as of 07/16/2021  Medication Sig   Accu-Chek FastClix Lancets MISC USE TO CHECK GLUCOSE DAILY AND AS NEEDED   ACCU-CHEK GUIDE test strip USE AS DIRECTED   acetaminophen (TYLENOL) 650 MG CR tablet Take 650 mg by mouth daily as needed for pain.   Blood Glucose Monitoring Suppl  (ACCU-CHEK GUIDE) w/Device KIT 1 each by Does not apply route daily.   Calcium Carbonate-Vitamin D 600-400 MG-UNIT per tablet Take 1 tablet by mouth daily.    cetirizine (ZYRTEC) 10 MG tablet Take 10 mg by mouth at bedtime as needed for allergies.    Cholecalciferol (VITAMIN D3) 2000 units TABS Take by mouth.   enalapril (VASOTEC) 5 MG tablet TAKE 1 TABLET BY MOUTH EVERY DAY   FERROCITE 324 MG TABS tablet TAKE 1 TABLET BY MOUTH EVERY MORNING   fluticasone (FLONASE) 50 MCG/ACT nasal spray USE 2 SPRAYS IN EACH NOSTRIL ONCE DAILY (Patient taking differently: Place 1 spray into both nostrils as needed.)   Fluticasone-Salmeterol (ADVAIR) 100-50 MCG/DOSE AEPB Inhale 1 puff into the lungs 2 (two) times daily as needed.   furosemide (LASIX) 20 MG tablet Take 1 tablet (20 mg total) by mouth every morning. AS NEEDED. TAKE NO MORE THAN 2 DAYS IN A ROW.   Glucosamine-Chondroitin (OSTEO BI-FLEX REGULAR STRENGTH PO) Take by mouth daily. Twice a day   glucose blood (ACCU-CHEK GUIDE) test strip Use as instructed   glucose blood test strip Use to check glucose daily and as needed   Lancets Misc. (ACCU-CHEK SOFTCLIX LANCET DEV) KIT Dispense 1 kit   meloxicam (MOBIC) 7.5 MG tablet TAKE 1 TABLET BY MOUTH EVERY DAY   metFORMIN (GLUCOPHAGE) 500 MG tablet TAKE 2 TABLETS BY MOUTH EVERY DAY   Multiple Vitamins-Minerals (  CENTRUM SILVER) tablet Take 1 tablet by mouth daily.    mupirocin ointment (BACTROBAN) 2 % mupirocin 2 % topical ointment  APPLY TOPICALLY TO THE AFFECTED AREA TWICE DAILY FOR 7 DAYS   omeprazole (PRILOSEC) 20 MG capsule TAKE 1 CAPSULE BY MOUTH EVERY DAY   pravastatin (PRAVACHOL) 40 MG tablet TAKE 1 TABLET EVERY NIGHT AT BEDTIME   raloxifene (EVISTA) 60 MG tablet TAKE 1 TABLET(60 MG) BY MOUTH DAILY   sertraline (ZOLOFT) 25 MG tablet Take 1 tablet (25 mg total) by mouth daily.   WIXELA INHUB 100-50 MCG/DOSE AEPB INHALE 1 PUFF INTO THE LUNGS TWICE DAILY   XARELTO 20 MG TABS tablet TAKE 1 TABLET BY MOUTH  EVERY DAY   No facility-administered encounter medications on file as of 07/16/2021.    Allergies (verified) Codeine sulfate and Codeine   History: Past Medical History:  Diagnosis Date   A-fib ([place])    Anemia    Asthma    Colon polyps 2005   Diabetes mellitus without complication ([place]) 4034   Dysrhythmia    GERD (gastroesophageal reflux disease)    HOH (hard of hearing)    AIDS   Hyperlipidemia    Hypertension    Obesity    Presence of permanent cardiac pacemaker    Rheumatic mitral valve failure    Unspecified atrial fibrillation Taylor Snyder)    Past Surgical History:  Procedure Laterality Date   ABDOMINAL HYSTERECTOMY     APPENDECTOMY     CATARACT EXTRACTION W/PHACO Right 09/29/2016   Procedure: CATARACT EXTRACTION PHACO AND INTRAOCULAR LENS PLACEMENT ([place]);  Surgeon: Birder Robson, MD;  Location: [facility] ORS;  Service: Ophthalmology;  Laterality: Right;  Taylor Snyder 00:46 AP% 18.9 CDE 8.69 Fluid pack lot # [id number]   CATARACT EXTRACTION W/PHACO Left 10/27/2016   Procedure: CATARACT EXTRACTION PHACO AND INTRAOCULAR LENS PLACEMENT ([facility]);  Surgeon: Birder Robson, MD;  Location: [facility] OR[facility];  Service: Ophthalmology;  Laterality: Left;  Taylor Snyder 00:49 AP% 22.4 CDE 11.03 Fluid pack lot # [id number]   CHOLECYSTECTOMY  2008   COLONOSCOPY  2005, 2014   Dr Taylor Snyder   FLEXIBLE SIGMOIDOSCOPY  2006   INSERT / REPLACE / REMOVE PACEMAKER     PACEMAKER INSERTION Left 08/21/2016   Procedure: PACEMAKER CHANGE OUT;  Surgeon: Marzetta Board, MD;  Location: [facility] ORS;  Service: Cardiovascular;  Laterality: Left;   PACEMAKER PLACEMENT  2008   salpingo oophorectmy      TONSILLECTOMY     Family History  Problem Relation Age of Onset   Hypertension Mother    Stroke Father    Heart disease Brother    Alcohol abuse Brother    Social History   Socioeconomic History   Marital status: Married    Spouse name: Not on file   Number of children: 1   Years of education: Not on file   Highest education level:  12th grade  Occupational History   Occupation: RETIRED  Tobacco Use   Smoking status: Never   Smokeless tobacco: Never  Vaping Use   Vaping Use: Never used  Substance and Sexual Activity   Alcohol use: No   Drug use: No   Sexual activity: Never  Other Topics Concern   Not on file  Social History Narrative   Not on file   Social Determinants of Health   Financial Resource Strain: Not on file  Food Insecurity: Not on file  Transportation Needs: Not on file  Physical Activity: Not on file  Stress: Not on file  Social  Connections: Not on file    Tobacco Counseling Counseling given: Not Answered   Clinical Intake:  Pre-visit preparation completed: Yes  Pain : No/denies pain     Nutritional Risks: None Diabetes: Yes CBG done?: No Did pt. bring in CBG monitor from home?: No  How often do you need to have someone help you when you read instructions, pamphlets, or other written materials from your doctor or pharmacy?: 1 - Never  Diabetic?yes Nutrition Risk Assessment:  Has the patient had any N/V/D within the last 2 months?  No  Does the patient have any non-healing wounds?  No  Has the patient had any unintentional weight loss or weight gain?  No   Diabetes:  Is the patient diabetic?  Yes  If diabetic, was a CBG obtained today?  No  Did the patient bring in their glucometer from home?  No  How often do you monitor your CBG's? Couple times week  Financial Strains and Diabetes Management:  Are you having any financial strains with the device, your supplies or your medication? No .  Does the patient want to be seen by Chronic Care Management for management of their diabetes?  No  Would the patient like to be referred to a Nutritionist or for Diabetic Management?  No   Diabetic Exams:  Diabetic Eye Exam: Completed 10/16/20.  Pt has been advised about the importance in completing this exam.  Diabetic Foot Exam: Completed 10/18/19. Pt has been advised about the  importance in completing this exam.    Interpreter Needed?: No  Information entered by :: Kirke Shaggy, LPN   Activities of Daily Living    09/24/2020    9:54 AM  In your present state of health, do you have any difficulty performing the following activities:  Hearing? 1  Vision? 1  Difficulty concentrating or making decisions? 0  Walking or climbing stairs? 0  Dressing or bathing? 0  Doing errands, shopping? 0    Patient Care Team: Jerrol Banana., MD as PCP - General (Family Medicine) Corey Skains, MD as Consulting Physician (Cardiology) Birder Robson, MD as Referring Physician (Ophthalmology) Sharlotte Alamo, DPM (Podiatry)  Indicate any recent Medical Services you may have received from other than [facility] providers in the past year (date may be approximate).     Assessment:   This is a routine wellness examination for [place].  Hearing/Vision screen No results found.  Dietary issues and exercise activities discussed:     Goals Addressed   None    Depression Screen    09/24/2020    9:53 AM 03/27/2020    1:42 PM 03/20/2019    2:19 PM 04/05/2018    8:57 AM 03/15/2018    2:25 PM 03/04/2017    8:52 AM 02/26/2016    3:19 PM  PHQ 2/9 Scores  PHQ - 2 Score 0 0 0 0 0 0 0  PHQ- 9 Score 0          Fall Risk    09/24/2020    9:53 AM 03/27/2020    1:43 PM 03/20/2019    2:21 PM 04/05/2018    8:57 AM 03/15/2018    2:25 PM  Fall Risk   Falls in the past year? 0 1 0 0 0  Number falls in past yr: 0 0 0    Injury with Fall? 0 1 0    Risk for fall due to : Impaired balance/gait No Fall Risks     Follow up Falls  evaluation completed Falls prevention discussed       FALL RISK PREVENTION PERTAINING TO THE HOME:  Any stairs in or around the home? Yes  If so, are there any without handrails? No  Home free of loose throw rugs in walkways, pet beds, electrical cords, etc? Yes  Adequate lighting in your home to reduce risk of falls? Yes   ASSISTIVE DEVICES UTILIZED TO  PREVENT FALLS:  Life alert? No  Use of a cane, walker or w/c? No  Grab bars in the bathroom? Yes  Shower chair or bench in shower? Yes  Elevated toilet seat or a handicapped toilet? No     Cognitive Function: declined        02/26/2016    3:30 PM  6CIT Screen  What Year? 0 points  What month? 0 points  What time? 0 points  Count back from 20 0 points  Months in reverse 0 points  Repeat phrase 0 points  Total Score 0 points    Immunizations Immunization History  Administered Date(s) Administered   Fluad Quad(high Dose 65+) 01/05/2019, 10/30/2019   Influenza, High Dose Seasonal PF 10/31/2014, 12/24/2015, 12/10/2016, 11/30/2017   Influenza-Unspecified 12/03/2020   Moderna Sars-Covid-2 Vaccination 04/04/2019, 05/02/2019, 01/18/2020, 10/07/2020   Pneumococcal Conjugate-13 07/19/2013   Pneumococcal Polysaccharide-23 10/04/1997   Td 03/01/2009   Tdap 04/09/2021   Zoster Recombinat (Shingrix) 12/09/2020, 03/24/2021    TDAP status: Up to date  Flu Vaccine status: Up to date  Pneumococcal vaccine status: Up to date  Covid-19 vaccine status: Completed vaccines  Qualifies for Shingles Vaccine? Yes   Zostavax completed Yes   Shingrix Completed?: Yes  Screening Tests Health Maintenance  Topic Date Due   DEXA SCAN  08/02/2020   FOOT EXAM  10/29/2020   COVID-19 Vaccine (5 - Booster for Moderna series) 12/02/2020   INFLUENZA VACCINE  09/16/2021   HEMOGLOBIN A1C  10/07/2021   OPHTHALMOLOGY EXAM  10/16/2021   TETANUS/TDAP  04/10/2031   Pneumonia Vaccine 20+ Years old  Completed   Zoster Vaccines- Shingrix  Completed   HPV VACCINES  Aged Out    Health Maintenance  Health Maintenance Due  Topic Date Due   DEXA SCAN  08/02/2020   FOOT EXAM  10/29/2020   COVID-19 Vaccine (5 - Booster for Moderna series) 12/02/2020    Colorectal cancer screening: No longer required.   Mammogram status: No longer required due to age.  Bone Density status: Completed 08/03/18.  Results reflect: Bone density results: NORMAL. Repeat every 5 years.  Lung Cancer Screening: (Low Dose CT Chest recommended if Age 28-80 years, 30 pack-year currently smoking OR have quit w/in 15years.) does not qualify.    Additional Screening:  Hepatitis C Screening: does not qualify; Completed no  Vision Screening: Recommended annual ophthalmology exams for early detection of glaucoma and other disorders of the eye. Is the patient up to date with their annual eye exam?  Yes  Who is the provider or what is the name of the office in which the patient attends annual eye exams? [facility] If pt is not established with a provider, would they like to be referred to a provider to establish care? No .   Dental Screening: Recommended annual dental exams for proper oral hygiene  Community Resource Referral / Chronic Care Management: CRR required this visit?  No   CCM required this visit?  No      Plan:     I have personally reviewed and noted the following in  the patient's chart:   Medical and social history Use of alcohol, tobacco or illicit drugs  Current medications and supplements including opioid prescriptions.  Functional ability and status Nutritional status Physical activity Advanced directives List of other physicians Hospitalizations, surgeries, and ER visits in previous 12 months Vitals Screenings to include cognitive, depression, and falls Referrals and appointments  In addition, I have reviewed and discussed with patient certain preventive protocols, quality metrics, and best practice recommendations. A written personalized care plan for preventive services as well as general preventive health recommendations were provided to patient.     Taylor David, LPN   05/15/5186   Nurse Notes:

## 2021-08-06 NOTE — Progress Notes (Deleted)
Established patient visit   Patient: Taylor Snyder   DOB: 1928-08-23   88 y.o. Female  MRN: [id number] Visit Date: 08/07/2021  Today's healthcare provider: [name], MD   No chief complaint on file.  Subjective    HPI  Diabetes Mellitus Type II, follow-up  Lab Results  Component Value Date   HGBA1C 7.4 (A) 04/09/2021   HGBA1C 7.5 (H) 09/24/2020   HGBA1C 6.9 (A) 03/27/2020   Last seen for diabetes 3 months ago.  Management since then includes continuing the same treatment. She reports {excellent/good/fair/poor:19665} compliance with treatment. She {is/is not:21021397} having side effects. {document side effects if present:1}  Home blood sugar records: {diabetes glucometry results:16657}  Episodes of hypoglycemia? {Yes/No:20286} {enter details if yes:1}   Current insulin regiment: {***Type 'None' if not taking insulin                                                otherwise enter complete                                                 details of insulin regiment:1} Most Recent Eye Exam: ***  --------------------------------------------------------------------------------------------------- Hypertension, follow-up  BP Readings from Last 3 Encounters:  05/19/21 (!) 168/78  05/08/21 (!) 155/59  04/22/21 (!) 137/45   Wt Readings from Last 3 Encounters:  07/16/21 160 lb (72.6 kg)  05/19/21 160 lb 0.9 oz (72.6 kg)  05/08/21 160 lb 1.6 oz (72.6 kg)     She was last seen for hypertension 10 months ago.  BP at that visit was 131/72. Management since that visit includes continue Enalapril. She reports {excellent/good/fair/poor:19665} compliance with treatment. She {is/is not:9024} having side effects. {document side effects if present:1} She {is/is not:9024} exercising. She {is/is not:9024} adherent to low salt diet.   Outside blood pressures are {enter patient reported home BP, or 'not being checked':1}.  She does not  smoke. --------------------------------------------------------------------------------------------------- Lipid/Cholesterol, follow-up  Last Lipid Panel: Lab Results  Component Value Date   CHOL 154 09/24/2020   LDLCALC 65 09/24/2020   HDL 76 09/24/2020   TRIG 64 09/24/2020    She was last seen for this 10 months ago.  Management since that visit includes continue Statin.  She reports {excellent/good/fair/poor:19665} compliance with treatment. She {is/is not:9024} having side effects. {document side effects if present:1}  Symptoms: {Yes/No:20286} appetite changes {Yes/No:20286} foot ulcerations  {Yes/No:20286} chest pain {Yes/No:20286} chest pressure/discomfort  {Yes/No:20286} dyspnea {Yes/No:20286} orthopnea  {Yes/No:20286} fatigue {Yes/No:20286} lower extremity edema  {Yes/No:20286} palpitations {Yes/No:20286} paroxysmal nocturnal dyspnea  {Yes/No:20286} nausea {Yes/No:20286} numbness or tingling of extremity  {Yes/No:20286} polydipsia {Yes/No:20286} polyuria  {Yes/No:20286} speech difficulty {Yes/No:20286} syncope   She is following a {diet:21022986} diet. Current exercise: {exercise WKGSU:11031}  Last metabolic panel Lab Results  Component Value Date   GLUCOSE 132 (H) 05/19/2021   NA 131 (L) 05/19/2021   K 4.1 05/19/2021   BUN 11 05/19/2021   CREATININE 0.52 05/19/2021   EGFR 79 05/08/2021   GFRNONAA >60 05/19/2021   CALCIUM 8.7 (L) 05/19/2021   AST 19 05/19/2021   ALT 16 05/19/2021   The ASCVD Risk score (Arnett DK, et al., 2019) failed to calculate for the following reasons:  The 2019 ASCVD risk score is only valid for ages 72 to 68  ---------------------------------------------------------------------------------------------------   Medications: Outpatient Medications Prior to Visit  Medication Sig   Accu-Chek FastClix Lancets MISC USE TO CHECK GLUCOSE DAILY AND AS NEEDED   ACCU-CHEK GUIDE test strip USE AS DIRECTED   acetaminophen (TYLENOL) 650 MG CR  tablet Take 650 mg by mouth daily as needed for pain.   Blood Glucose Monitoring Suppl (ACCU-CHEK GUIDE) w/Device KIT 1 each by Does not apply route daily.   Calcium Carbonate-Vitamin D 600-400 MG-UNIT per tablet Take 1 tablet by mouth daily.    cetirizine (ZYRTEC) 10 MG tablet Take 10 mg by mouth at bedtime as needed for allergies.    Cholecalciferol (VITAMIN D3) 2000 units TABS Take by mouth.   enalapril (VASOTEC) 5 MG tablet TAKE 1 TABLET BY MOUTH EVERY DAY   FERROCITE 324 MG TABS tablet TAKE 1 TABLET BY MOUTH EVERY MORNING   fluticasone (FLONASE) 50 MCG/ACT nasal spray USE 2 SPRAYS IN EACH NOSTRIL ONCE DAILY (Patient taking differently: Place 1 spray into both nostrils as needed.)   Fluticasone-Salmeterol (ADVAIR) 100-50 MCG/DOSE AEPB Inhale 1 puff into the lungs 2 (two) times daily as needed.   furosemide (LASIX) 20 MG tablet Take 1 tablet (20 mg total) by mouth every morning. AS NEEDED. TAKE NO MORE THAN 2 DAYS IN A ROW.   Glucosamine-Chondroitin (OSTEO BI-FLEX REGULAR STRENGTH PO) Take by mouth daily. Twice a day   glucose blood (ACCU-CHEK GUIDE) test strip Use as instructed   glucose blood test strip Use to check glucose daily and as needed   Lancets Misc. (ACCU-CHEK SOFTCLIX LANCET DEV) KIT Dispense 1 kit   meloxicam (MOBIC) 7.5 MG tablet TAKE 1 TABLET BY MOUTH EVERY DAY (Patient not taking: Reported on 07/16/2021)   metFORMIN (GLUCOPHAGE) 500 MG tablet TAKE 2 TABLETS BY MOUTH EVERY DAY   Multiple Vitamins-Minerals (CENTRUM SILVER) tablet Take 1 tablet by mouth daily.    mupirocin ointment (BACTROBAN) 2 % mupirocin 2 % topical ointment  APPLY TOPICALLY TO THE AFFECTED AREA TWICE DAILY FOR 7 DAYS   omeprazole (PRILOSEC) 20 MG capsule TAKE 1 CAPSULE BY MOUTH EVERY DAY   pravastatin (PRAVACHOL) 40 MG tablet TAKE 1 TABLET EVERY NIGHT AT BEDTIME   raloxifene (EVISTA) 60 MG tablet TAKE 1 TABLET(60 MG) BY MOUTH DAILY   sertraline (ZOLOFT) 25 MG tablet Take 1 tablet (25 mg total) by mouth  daily.   WIXELA INHUB 100-50 MCG/DOSE AEPB INHALE 1 PUFF INTO THE LUNGS TWICE DAILY (Patient not taking: Reported on 07/16/2021)   XARELTO 20 MG TABS tablet TAKE 1 TABLET BY MOUTH EVERY DAY   No facility-administered medications prior to visit.    Review of Systems  {Labs  Heme  Chem  Endocrine  Serology  Results Review (optional):23779}   Objective    There were no vitals taken for this visit. {Show previous vital signs (optional):23777}  Physical Exam  ***  No results found for any visits on 08/07/21.  Assessment & Plan     ***  No follow-ups on file.      {provider attestation***:1}   [name], MD  [facility] [phone] (phone) [phone] (fax)  [place]

## 2021-08-07 ENCOUNTER — Ambulatory Visit: Payer: Medicare Other | Admitting: Family Medicine

## 2021-08-13 ENCOUNTER — Ambulatory Visit (INDEPENDENT_AMBULATORY_CARE_PROVIDER_SITE_OTHER): Payer: Medicare Other | Admitting: Family Medicine

## 2021-08-13 ENCOUNTER — Encounter: Payer: Self-pay | Admitting: Family Medicine

## 2021-08-13 VITALS — BP 124/60 | HR 76 | Resp 16 | Wt 145.0 lb

## 2021-08-13 DIAGNOSIS — K625 Hemorrhage of anus and rectum: Secondary | ICD-10-CM | POA: Diagnosis not present

## 2021-08-13 DIAGNOSIS — I4819 Other persistent atrial fibrillation: Secondary | ICD-10-CM | POA: Diagnosis not present

## 2021-08-13 DIAGNOSIS — R634 Abnormal weight loss: Secondary | ICD-10-CM

## 2021-08-13 DIAGNOSIS — E119 Type 2 diabetes mellitus without complications: Secondary | ICD-10-CM

## 2021-08-13 DIAGNOSIS — I495 Sick sinus syndrome: Secondary | ICD-10-CM

## 2021-08-13 DIAGNOSIS — M810 Age-related osteoporosis without current pathological fracture: Secondary | ICD-10-CM | POA: Diagnosis not present

## 2021-08-13 NOTE — Progress Notes (Signed)
Established patient visit  I,Taylor Snyder,acting as a scribe for Taylor Durie, MD.,have documented all relevant documentation on the behalf of Taylor Durie, MD,as directed by  Taylor Durie, MD while in the presence of Taylor Durie, MD.   Patient: Taylor Snyder   DOB: [birth year]   88 y.o. Female  MRN: [id number] Visit Date: 08/13/2021  Today's healthcare provider: [name], MD   Chief Complaint  Patient presents with   Follow-up   Diabetes   Subjective    HPI  Comes in today for follow-up.  She had a recent fecal blood that was positive.  She has had no symptoms since then and she wishes no work-up per patient and daughter.  She is otherwise asymptomatic.  She does not have much of an appetite but this has been going on for some time.  She is starting to lose weight. Overall she feels pretty well, just weaker than in years past.  Diabetes Mellitus Type II, Follow-up  Lab Results  Component Value Date   HGBA1C 7.4 (A) 04/09/2021   HGBA1C 7.5 (H) 09/24/2020   HGBA1C 6.9 (A) 03/27/2020   Wt Readings from Last 3 Encounters:  08/13/21 145 lb (65.8 kg)  07/16/21 160 lb (72.6 kg)  05/19/21 160 lb 0.9 oz (72.6 kg)   Last seen for diabetes 10 months ago.  Management since then includes .  Home blood sugar records: fasting range: 130-140 Most Recent Eye Exam: 10/16/2020  Pertinent Labs: Lab Results  Component Value Date   CHOL 154 09/24/2020   HDL 76 09/24/2020   LDLCALC 65 09/24/2020   TRIG 64 09/24/2020   CHOLHDL 2.0 09/24/2020   Lab Results  Component Value Date   NA 131 (L) 05/19/2021   K 4.1 05/19/2021   CREATININE 0.52 05/19/2021   GFRNONAA >60 05/19/2021   MICROALBUR 20 03/16/2017     ---------------------------------------------------------------------------------------------------    Medications: Outpatient Medications Prior to Visit  Medication Sig   Accu-Chek FastClix Lancets MISC USE TO CHECK GLUCOSE DAILY AND AS  NEEDED   acetaminophen (TYLENOL) 650 MG CR tablet Take 650 mg by mouth daily as needed for pain.   Calcium Carbonate-Vitamin D 600-400 MG-UNIT per tablet Take 1 tablet by mouth daily.    cetirizine (ZYRTEC) 10 MG tablet Take 10 mg by mouth at bedtime as needed for allergies.    Cholecalciferol (VITAMIN D3) 2000 units TABS Take by mouth.   enalapril (VASOTEC) 5 MG tablet TAKE 1 TABLET BY MOUTH EVERY DAY   FERROCITE 324 MG TABS tablet TAKE 1 TABLET BY MOUTH EVERY MORNING   fluticasone (FLONASE) 50 MCG/ACT nasal spray USE 2 SPRAYS IN EACH NOSTRIL ONCE DAILY (Patient taking differently: Place 1 spray into both nostrils as needed.)   Fluticasone-Salmeterol (ADVAIR) 100-50 MCG/DOSE AEPB Inhale 1 puff into the lungs 2 (two) times daily as needed.   furosemide (LASIX) 20 MG tablet Take 1 tablet (20 mg total) by mouth every morning. AS NEEDED. TAKE NO MORE THAN 2 DAYS IN A ROW.   glucose blood (ACCU-CHEK GUIDE) test strip Use as instructed   glucose blood test strip Use to check glucose daily and as needed   metFORMIN (GLUCOPHAGE) 500 MG tablet TAKE 2 TABLETS BY MOUTH EVERY DAY   Multiple Vitamins-Minerals (CENTRUM SILVER) tablet Take 1 tablet by mouth daily.    mupirocin ointment (BACTROBAN) 2 % mupirocin 2 % topical ointment  APPLY TOPICALLY TO THE AFFECTED AREA TWICE DAILY FOR 7 DAYS  omeprazole (PRILOSEC) 20 MG capsule TAKE 1 CAPSULE BY MOUTH EVERY DAY   pravastatin (PRAVACHOL) 40 MG tablet TAKE 1 TABLET EVERY NIGHT AT BEDTIME   raloxifene (EVISTA) 60 MG tablet TAKE 1 TABLET(60 MG) BY MOUTH DAILY   sertraline (ZOLOFT) 25 MG tablet Take 1 tablet (25 mg total) by mouth daily.   XARELTO 20 MG TABS tablet TAKE 1 TABLET BY MOUTH EVERY DAY   [DISCONTINUED] ACCU-CHEK GUIDE test strip USE AS DIRECTED (Patient not taking: Reported on 08/13/2021)   [DISCONTINUED] Blood Glucose Monitoring Suppl (ACCU-CHEK GUIDE) w/Device KIT 1 each by Does not apply route daily. (Patient not taking: Reported on 08/13/2021)    [DISCONTINUED] Glucosamine-Chondroitin (OSTEO BI-FLEX REGULAR STRENGTH PO) Take by mouth daily. Twice a day (Patient not taking: Reported on 08/13/2021)   [DISCONTINUED] Lancets Misc. (ACCU-CHEK SOFTCLIX LANCET DEV) KIT Dispense 1 kit (Patient not taking: Reported on 08/13/2021)   [DISCONTINUED] meloxicam (MOBIC) 7.5 MG tablet TAKE 1 TABLET BY MOUTH EVERY DAY (Patient not taking: Reported on 07/16/2021)   [DISCONTINUED] WIXELA INHUB 100-50 MCG/DOSE AEPB INHALE 1 PUFF INTO THE LUNGS TWICE DAILY (Patient not taking: Reported on 07/16/2021)   No facility-administered medications prior to visit.    Review of Systems  Constitutional:  Negative for appetite change, chills, fatigue and fever.  Respiratory:  Negative for chest tightness and shortness of breath.   Cardiovascular:  Negative for chest pain and palpitations.  Gastrointestinal:  Negative for abdominal pain, nausea and vomiting.  Neurological:  Negative for dizziness and weakness.    Last CBC Lab Results  Component Value Date   WBC 7.0 08/13/2021   HGB 12.2 08/13/2021   HCT 35.2 08/13/2021   MCV 90 08/13/2021   MCH 31.0 08/13/2021   RDW 13.1 08/13/2021   PLT 184 08/13/2021       Objective    BP 124/60 (BP Location: Right Arm, Patient Position: Sitting, Cuff Size: Normal)   Pulse 76   Resp 16   Wt 145 lb (65.8 kg)   SpO2 96%   BMI 25.69 kg/m  BP Readings from Last 3 Encounters:  08/13/21 124/60  05/19/21 (!) 168/78  05/08/21 (!) 155/59   Wt Readings from Last 3 Encounters:  08/13/21 145 lb (65.8 kg)  07/16/21 160 lb (72.6 kg)  05/19/21 160 lb 0.9 oz (72.6 kg)      Physical Exam Vitals reviewed.  Constitutional:      Appearance: Normal appearance. She is well-developed and normal weight.  HENT:     Head: Normocephalic and atraumatic.     Right Ear: External ear normal.     Left Ear: External ear normal.     Nose: Nose normal.     Mouth/Throat:     Pharynx: Oropharynx is clear.  Eyes:     General: No scleral  icterus.    Conjunctiva/sclera: Conjunctivae normal.  Neck:     Thyroid: No thyromegaly.     Vascular: No carotid bruit.  Cardiovascular:     Rate and Rhythm: Normal rate and regular rhythm.     Heart sounds: Normal heart sounds.  Pulmonary:     Effort: Pulmonary effort is normal.     Breath sounds: Normal breath sounds.  Abdominal:     Palpations: Abdomen is soft.  Musculoskeletal:     Right lower leg: Edema present.     Left lower leg: Edema present.     Comments: Trace lower extremity edema and lymphedema  Skin:    General: Skin is warm and dry.  Neurological:     General: No focal deficit present.     Mental Status: She is alert and oriented to person, place, and time.  Psychiatric:        Mood and Affect: Mood normal.        Behavior: Behavior normal.        Thought Content: Thought content normal.        Judgment: Judgment normal.       No results found for any visits on 08/13/21.  Assessment & Plan     1. Type 2 diabetes mellitus without complication, without long-term current use of insulin (HCC) Goal A1c less than 8.5-9 in this patient.  Avoid hypoglycemia at all cost - Comprehensive Metabolic Panel (CMET) - HgB A1c  2. Persistent atrial fibrillation (HCC) Eliquis - TSH  3. Rectal bleeding Patient asymptomatic recently.  They wish no work-up for this presently at 89.  I am fine with this will follow clinically with weights and blood work - CBC with Differential/Platelet  4. Weight loss Blood work today and will follow her Clinically in about 2 Months.  Parking form is filled out - CBC with Differential/Platelet - Comprehensive Metabolic Panel (CMET) - TSH  5. Osteoporosis, unspecified osteoporosis type, unspecified pathological fracture presence   6. Sick sinus syndrome (HCC)    No follow-ups on file.      I, Taylor Durie, MD, have reviewed all documentation for this visit. The documentation on 08/15/21 for the exam, diagnosis,  procedures, and orders are all accurate and complete.    Kessler Solly Cranford Mon, MD  [facility] [phone] (phone) [phone] (fax)  [place]

## 2021-08-14 LAB — CBC WITH DIFFERENTIAL/PLATELET
Basophils Absolute: 0 10*3/uL (ref 0.0–0.2)
Basos: 0 %
EOS (ABSOLUTE): 0.1 10*3/uL (ref 0.0–0.4)
Eos: 1 %
Hematocrit: 35.2 % (ref 34.0–46.6)
Hemoglobin: 12.2 g/dL (ref 11.1–15.9)
Immature Grans (Abs): 0 10*3/uL (ref 0.0–0.1)
Immature Granulocytes: 0 %
Lymphocytes Absolute: 2.5 10*3/uL (ref 0.7–3.1)
Lymphs: 35 %
MCH: 31 pg (ref 26.6–33.0)
MCHC: 34.7 g/dL (ref 31.5–35.7)
MCV: 90 fL (ref 79–97)
Monocytes Absolute: 0.6 10*3/uL (ref 0.1–0.9)
Monocytes: 9 %
Neutrophils Absolute: 3.9 10*3/uL (ref 1.4–7.0)
Neutrophils: 55 %
Platelets: 184 10*3/uL (ref 150–450)
RBC: 3.93 x10E6/uL (ref 3.77–5.28)
RDW: 13.1 % (ref 11.7–15.4)
WBC: 7 10*3/uL (ref 3.4–10.8)

## 2021-08-14 LAB — COMPREHENSIVE METABOLIC PANEL
ALT: 12 IU/L (ref 0–32)
AST: 16 IU/L (ref 0–40)
Albumin/Globulin Ratio: 2.2 (ref 1.2–2.2)
Albumin: 4.2 g/dL (ref 3.5–4.6)
Alkaline Phosphatase: 75 IU/L (ref 44–121)
BUN/Creatinine Ratio: 12 (ref 12–28)
BUN: 9 mg/dL — ABNORMAL LOW (ref 10–36)
Bilirubin Total: 0.2 mg/dL (ref 0.0–1.2)
CO2: 23 mmol/L (ref 20–29)
Calcium: 9.6 mg/dL (ref 8.7–10.3)
Chloride: 95 mmol/L — ABNORMAL LOW (ref 96–106)
Creatinine, Ser: 0.74 mg/dL (ref 0.57–1.00)
Globulin, Total: 1.9 g/dL (ref 1.5–4.5)
Glucose: 159 mg/dL — ABNORMAL HIGH (ref 70–99)
Potassium: 4.4 mmol/L (ref 3.5–5.2)
Sodium: 133 mmol/L — ABNORMAL LOW (ref 134–144)
Total Protein: 6.1 g/dL (ref 6.0–8.5)
eGFR: 76 mL/min/{1.73_m2} (ref >59)

## 2021-08-14 LAB — TSH: TSH: 2.33 u[IU]/mL (ref 0.450–4.500)

## 2021-08-14 LAB — HEMOGLOBIN A1C
Est. average glucose Bld gHb Est-mCnc: 151 mg/dL
Hgb A1c MFr Bld: 6.9 % — ABNORMAL HIGH (ref 4.8–5.6)

## 2021-08-26 DIAGNOSIS — K5989 Other specified functional intestinal disorders: Secondary | ICD-10-CM | POA: Diagnosis not present

## 2021-09-10 ENCOUNTER — Other Ambulatory Visit: Payer: Self-pay | Admitting: Family Medicine

## 2021-09-10 DIAGNOSIS — M25512 Pain in left shoulder: Secondary | ICD-10-CM | POA: Diagnosis not present

## 2021-09-10 DIAGNOSIS — K922 Gastrointestinal hemorrhage, unspecified: Secondary | ICD-10-CM

## 2021-09-10 DIAGNOSIS — E119 Type 2 diabetes mellitus without complications: Secondary | ICD-10-CM | POA: Diagnosis not present

## 2021-09-10 DIAGNOSIS — R609 Edema, unspecified: Secondary | ICD-10-CM

## 2021-09-15 ENCOUNTER — Other Ambulatory Visit: Payer: Self-pay | Admitting: Family Medicine

## 2021-09-15 DIAGNOSIS — M1990 Unspecified osteoarthritis, unspecified site: Secondary | ICD-10-CM

## 2021-09-16 DIAGNOSIS — E1142 Type 2 diabetes mellitus with diabetic polyneuropathy: Secondary | ICD-10-CM | POA: Diagnosis not present

## 2021-09-16 DIAGNOSIS — B351 Tinea unguium: Secondary | ICD-10-CM | POA: Diagnosis not present

## 2021-09-16 NOTE — Telephone Encounter (Signed)
Requested Prescriptions  Pending Prescriptions Disp Refills  . omeprazole (PRILOSEC) 20 MG capsule [Pharmacy Med Name: OMEPRAZOLE '20MG'$  CAPSULES] 90 capsule 0    Sig: TAKE 1 CAPSULE BY MOUTH EVERY DAY     Gastroenterology: Proton Pump Inhibitors Passed - 09/15/2021  2:33 PM      Passed - Valid encounter within last 12 months    Recent Outpatient Visits          1 month ago Type 2 diabetes mellitus without complication, without long-term current use of insulin [facility])   [facility] Jerrol Banana., MD   4 months ago Gastrointestinal hemorrhage, unspecified gastrointestinal hemorrhage type   [facility] Jerrol Banana., MD   4 months ago [place] [name], PA-C   5 months ago Laceration of scalp, subsequent encounter   [facility] Jerrol Banana., MD   7 months ago Encounter for staple removal   [facility] [name], PA-C      Future Appointments            In 3 weeks Jerrol Banana., MD [facility] [place]

## 2021-10-10 NOTE — Progress Notes (Unsigned)
I,Tiffany J Bragg,acting as a scribe for [name], MD.,have documented all relevant documentation on the behalf of [name], MD,as directed by  [name], MD while in the presence of [name], MD.   Established patient visit   Patient: Taylor Snyder   DOB: 09/12/1928   88 y.o. Female  MRN: [id number] Visit Date: 10/13/2021  Today's healthcare provider: [name], MD   Chief Complaint  Patient presents with   Weight Loss   Subjective    HPI  Patient is feeling well and is adjusting to her husband's necessary low-sodium no added sugar eating regimen for his significant health issues.  She thinks this is the reason she is losing weight.  Overall she has no complaints.  She wants no work-up of weight loss.  Her daughter is in agreement  Follow up for Weight loss:   The patient was last seen for this 2 months ago. Changes made at last visit include; Blood work today and will follow her Clinically in about 2 Months. Patient's daughter states she had blood done and was being monitored for positive hemoccult.   She reports excellent compliance with treatment. She feels that condition is Unchanged. She is not having side effects.   -----------------------------------------------------------------------------------------+  Medications: Outpatient Medications Prior to Visit  Medication Sig   Accu-Chek FastClix Lancets MISC USE TO CHECK GLUCOSE DAILY AND AS NEEDED   acetaminophen (TYLENOL) 650 MG CR tablet Take 650 mg by mouth daily as needed for pain.   Calcium Carbonate-Vitamin D 600-400 MG-UNIT per tablet Take 1 tablet by mouth daily.    cetirizine (ZYRTEC) 10 MG tablet Take 10 mg by mouth at bedtime as needed for allergies.    Cholecalciferol (VITAMIN D3) 2000 units TABS Take by mouth.   enalapril (VASOTEC) 5 MG tablet TAKE 1 TABLET BY MOUTH EVERY DAY   FERROCITE 324 MG TABS tablet TAKE 1 TABLET BY MOUTH EVERY MORNING   fluticasone  (FLONASE) 50 MCG/ACT nasal spray USE 2 SPRAYS IN EACH NOSTRIL ONCE DAILY (Patient taking differently: Place 1 spray into both nostrils as needed.)   Fluticasone-Salmeterol (ADVAIR) 100-50 MCG/DOSE AEPB Inhale 1 puff into the lungs 2 (two) times daily as needed.   furosemide (LASIX) 20 MG tablet TAKE 1 TABLET(20 MG) BY MOUTH EVERY MORNING AS NEEDED. NO MORE THAN 2 DAYS INAROW   glucose blood (ACCU-CHEK GUIDE) test strip Use as instructed   glucose blood test strip Use to check glucose daily and as needed   metFORMIN (GLUCOPHAGE) 500 MG tablet TAKE 2 TABLETS BY MOUTH EVERY DAY   Multiple Vitamins-Minerals (CENTRUM SILVER) tablet Take 1 tablet by mouth daily.    mupirocin ointment (BACTROBAN) 2 % mupirocin 2 % topical ointment  APPLY TOPICALLY TO THE AFFECTED AREA TWICE DAILY FOR 7 DAYS   omeprazole (PRILOSEC) 20 MG capsule TAKE 1 CAPSULE BY MOUTH EVERY DAY   pravastatin (PRAVACHOL) 40 MG tablet TAKE 1 TABLET EVERY NIGHT AT BEDTIME   raloxifene (EVISTA) 60 MG tablet TAKE 1 TABLET(60 MG) BY MOUTH DAILY   sertraline (ZOLOFT) 25 MG tablet Take 1 tablet (25 mg total) by mouth daily.   XARELTO 20 MG TABS tablet TAKE 1 TABLET BY MOUTH EVERY DAY   No facility-administered medications prior to visit.    Review of Systems  Constitutional:  Negative for appetite change, chills, fatigue and fever.  Respiratory:  Negative for chest tightness and shortness of breath.   Cardiovascular:  Negative for chest  pain and palpitations.  Gastrointestinal:  Negative for abdominal pain, nausea and vomiting.  Neurological:  Negative for dizziness and weakness.    Last hemoglobin A1c Lab Results  Component Value Date   HGBA1C 6.9 (H) 08/13/2021       Objective    BP (!) 131/52 (BP Location: Right Arm, Patient Position: Sitting, Cuff Size: Normal)   Pulse 76   Temp 97.8 F (36.6 C)   Resp 16   Ht '5\' 1"'$  (1.549 m)   Wt 141 lb (64 kg)   SpO2 98%   BMI 26.64 kg/m  BP Readings from Last 3 Encounters:   10/13/21 (!) 131/52  08/13/21 124/60  05/19/21 (!) 168/78   Wt Readings from Last 3 Encounters:  10/13/21 141 lb (64 kg)  08/13/21 145 lb (65.8 kg)  07/16/21 160 lb (72.6 kg)      Physical Exam Vitals reviewed.  Constitutional:      Appearance: Normal appearance. She is well-developed and normal weight.  HENT:     Head: Normocephalic and atraumatic.     Right Ear: External ear normal.     Left Ear: External ear normal.     Nose: Nose normal.     Mouth/Throat:     Pharynx: Oropharynx is clear.  Eyes:     General: No scleral icterus.    Conjunctiva/sclera: Conjunctivae normal.  Neck:     Thyroid: No thyromegaly.     Vascular: No carotid bruit.  Cardiovascular:     Rate and Rhythm: Normal rate and regular rhythm.     Heart sounds: Normal heart sounds.  Pulmonary:     Effort: Pulmonary effort is normal.     Breath sounds: Normal breath sounds.  Abdominal:     Palpations: Abdomen is soft.  Musculoskeletal:     Right lower leg: Edema present.     Left lower leg: Edema present.     Comments: Trace lower extremity edema and lymphedema  Skin:    General: Skin is warm and dry.  Neurological:     General: No focal deficit present.     Mental Status: She is alert and oriented to person, place, and time.  Psychiatric:        Mood and Affect: Mood normal.        Behavior: Behavior normal.        Thought Content: Thought content normal.        Judgment: Judgment normal.       No results found for any visits on 10/13/21.  Assessment & Plan     1. Weight loss Patient has made significant dietary changes at home.  No further work-up at this time. - CBC - TSH - Comprehensive metabolic panel  2. Primary hypertension Good blood pressure  3. Persistent atrial fibrillation (HCC) On Xarelto  4. Allergic rhinitis, unspecified seasonality, unspecified trigger   5. Osteoporosis, unspecified osteoporosis type, unspecified pathological fracture presence   6. Pure  hypercholesterolemia On pravastatin 7.DM No follow-ups on file.      I, [name], MD, have reviewed all documentation for this visit. The documentation on 10/14/21 for the exam, diagnosis, procedures, and orders are all accurate and complete.    Derrica Sieg Cranford Mon, MD  [facility] [phone] (phone) [phone] (fax)  [place]

## 2021-10-13 ENCOUNTER — Encounter: Payer: Self-pay | Admitting: Family Medicine

## 2021-10-13 ENCOUNTER — Ambulatory Visit (INDEPENDENT_AMBULATORY_CARE_PROVIDER_SITE_OTHER): Payer: Medicare Other | Admitting: Family Medicine

## 2021-10-13 VITALS — BP 131/52 | HR 76 | Temp 97.8°F | Resp 16 | Ht 61.0 in | Wt 141.0 lb

## 2021-10-13 DIAGNOSIS — M810 Age-related osteoporosis without current pathological fracture: Secondary | ICD-10-CM | POA: Diagnosis not present

## 2021-10-13 DIAGNOSIS — R634 Abnormal weight loss: Secondary | ICD-10-CM | POA: Diagnosis not present

## 2021-10-13 DIAGNOSIS — I4819 Other persistent atrial fibrillation: Secondary | ICD-10-CM

## 2021-10-13 DIAGNOSIS — E7800 Pure hypercholesterolemia, unspecified: Secondary | ICD-10-CM | POA: Diagnosis not present

## 2021-10-13 DIAGNOSIS — J309 Allergic rhinitis, unspecified: Secondary | ICD-10-CM

## 2021-10-13 DIAGNOSIS — I10 Essential (primary) hypertension: Secondary | ICD-10-CM | POA: Diagnosis not present

## 2021-10-15 LAB — COMPREHENSIVE METABOLIC PANEL
ALT: 14 IU/L (ref 0–32)
AST: 20 IU/L (ref 0–40)
Albumin/Globulin Ratio: 1.9 (ref 1.2–2.2)
Albumin: 4.1 g/dL (ref 3.6–4.6)
Alkaline Phosphatase: 83 IU/L (ref 44–121)
BUN/Creatinine Ratio: 17 (ref 12–28)
BUN: 11 mg/dL (ref 10–36)
Bilirubin Total: 0.3 mg/dL (ref 0.0–1.2)
CO2: 18 mmol/L — ABNORMAL LOW (ref 20–29)
Calcium: 9.5 mg/dL (ref 8.7–10.3)
Chloride: 94 mmol/L — ABNORMAL LOW (ref 96–106)
Creatinine, Ser: 0.63 mg/dL (ref 0.57–1.00)
Globulin, Total: 2.2 g/dL (ref 1.5–4.5)
Glucose: 110 mg/dL — ABNORMAL HIGH (ref 70–99)
Potassium: 4.5 mmol/L (ref 3.5–5.2)
Sodium: 132 mmol/L — ABNORMAL LOW (ref 134–144)
Total Protein: 6.3 g/dL (ref 6.0–8.5)
eGFR: 84 mL/min/{1.73_m2} (ref >59)

## 2021-10-15 LAB — CBC
Hematocrit: 38.4 % (ref 34.0–46.6)
Hemoglobin: 12.5 g/dL (ref 11.1–15.9)
MCH: 31.1 pg (ref 26.6–33.0)
MCHC: 32.6 g/dL (ref 31.5–35.7)
MCV: 96 fL (ref 79–97)
Platelets: 172 10*3/uL (ref 150–450)
RBC: 4.02 x10E6/uL (ref 3.77–5.28)
RDW: 13.5 % (ref 11.7–15.4)
WBC: 5.4 10*3/uL (ref 3.4–10.8)

## 2021-10-15 LAB — TSH: TSH: 1.84 u[IU]/mL (ref 0.450–4.500)

## 2021-10-16 DIAGNOSIS — H26492 Other secondary cataract, left eye: Secondary | ICD-10-CM | POA: Diagnosis not present

## 2021-10-16 LAB — HM DIABETES EYE EXAM

## 2021-10-30 DIAGNOSIS — B353 Tinea pedis: Secondary | ICD-10-CM | POA: Diagnosis not present

## 2021-10-30 DIAGNOSIS — E1142 Type 2 diabetes mellitus with diabetic polyneuropathy: Secondary | ICD-10-CM | POA: Diagnosis not present

## 2021-11-12 ENCOUNTER — Encounter: Payer: Self-pay | Admitting: *Deleted

## 2021-11-20 ENCOUNTER — Other Ambulatory Visit: Payer: Self-pay | Admitting: Family Medicine

## 2021-11-20 DIAGNOSIS — E7800 Pure hypercholesterolemia, unspecified: Secondary | ICD-10-CM

## 2021-12-02 ENCOUNTER — Telehealth: Payer: Self-pay

## 2021-12-02 MED ORDER — FLUTICASONE-SALMETEROL 100-50 MCG/ACT IN AEPB: 1.0000 | INHALATION_SPRAY | Freq: Two times a day (BID) | RESPIRATORY_TRACT | 3 refills | Status: AC

## 2021-12-02 NOTE — Telephone Encounter (Signed)
Copied from [place] [id number]. Topic: General - Other >> Dec 02, 2021  2:12 PM [place] B wrote: Reason for CRM: Patient calling has question about med. She wouldn't give the name of the medicine, just kept asking to speak with Alana.

## 2021-12-18 DIAGNOSIS — E1142 Type 2 diabetes mellitus with diabetic polyneuropathy: Secondary | ICD-10-CM | POA: Diagnosis not present

## 2021-12-18 DIAGNOSIS — B351 Tinea unguium: Secondary | ICD-10-CM | POA: Diagnosis not present

## 2021-12-22 DIAGNOSIS — Z23 Encounter for immunization: Secondary | ICD-10-CM | POA: Diagnosis not present

## 2021-12-29 ENCOUNTER — Other Ambulatory Visit: Payer: Self-pay | Admitting: Family Medicine

## 2021-12-29 DIAGNOSIS — M1990 Unspecified osteoarthritis, unspecified site: Secondary | ICD-10-CM

## 2021-12-29 NOTE — Telephone Encounter (Unsigned)
Copied from [place] [phone]. Topic: General - Other >> Dec 29, 2021 10:53 AM [place] C wrote: Reason for CRM: Medication Refill - Medication: omeprazole (PRILOSEC) 20 MG capsule [[id number]]  Has the patient contacted their pharmacy? Yes.   (Agent: If no, request that the patient contact the pharmacy for the refill. If patient does not wish to contact the pharmacy document the reason why and proceed with request.) (Agent: If yes, when and what did the pharmacy advise?)  Preferred Pharmacy (with phone number or street name): [facility] [place], [place] [address] [place] [address] Phone: [phone] Fax: [phone] Hours: Not open 24 hours   Has the patient been seen for an appointment in the last year OR does the patient have an upcoming appointment? Yes.    Agent: Please be advised that RX refills may take up to 3 business days. We ask that you follow-up with your pharmacy.

## 2021-12-30 MED ORDER — OMEPRAZOLE 20 MG PO CPDR: 20.0000 mg | DELAYED_RELEASE_CAPSULE | Freq: Every day | ORAL | 0 refills | Status: AC

## 2021-12-30 NOTE — Telephone Encounter (Signed)
Called pt and she wished to follow [name]. Pt asking for medical record release. Attn: Santiago Glad

## 2021-12-30 NOTE — Telephone Encounter (Signed)
No need for release.  Dr. Rosanna Randy can see records through [place].

## 2022-01-07 DIAGNOSIS — I495 Sick sinus syndrome: Secondary | ICD-10-CM | POA: Diagnosis not present

## 2022-02-20 ENCOUNTER — Other Ambulatory Visit: Payer: Self-pay | Admitting: Family Medicine

## 2022-02-20 DIAGNOSIS — I10 Essential (primary) hypertension: Secondary | ICD-10-CM

## 2022-02-20 NOTE — Telephone Encounter (Signed)
[place] faxed refill request for the following medications:  enalapril (VASOTEC) 5 MG tablet    XARELTO 20 MG TABS tablet    Please advise

## 2022-02-23 MED ORDER — ENALAPRIL MALEATE 5 MG PO TABS: 5.0000 mg | ORAL_TABLET | Freq: Every day | ORAL | 1 refills | Status: AC

## 2022-02-23 MED ORDER — RIVAROXABAN 20 MG PO TABS: 20.0000 mg | ORAL_TABLET | Freq: Every day | ORAL | 0 refills | Status: AC

## 2022-02-23 NOTE — Telephone Encounter (Signed)
Please review. Patient not schedule for office visit.  LOV was 10/13/2021.  Not sure if a courtesy refill was ok

## 2022-03-10 DIAGNOSIS — I340 Nonrheumatic mitral (valve) insufficiency: Secondary | ICD-10-CM | POA: Diagnosis not present

## 2022-03-10 DIAGNOSIS — M25561 Pain in right knee: Secondary | ICD-10-CM | POA: Diagnosis not present

## 2022-03-10 DIAGNOSIS — I129 Hypertensive chronic kidney disease with stage 1 through stage 4 chronic kidney disease, or unspecified chronic kidney disease: Secondary | ICD-10-CM | POA: Diagnosis not present

## 2022-03-10 DIAGNOSIS — E1122 Type 2 diabetes mellitus with diabetic chronic kidney disease: Secondary | ICD-10-CM | POA: Diagnosis not present

## 2022-03-10 DIAGNOSIS — E782 Mixed hyperlipidemia: Secondary | ICD-10-CM | POA: Diagnosis not present

## 2022-03-10 DIAGNOSIS — I10 Essential (primary) hypertension: Secondary | ICD-10-CM | POA: Diagnosis not present

## 2022-03-10 DIAGNOSIS — I480 Paroxysmal atrial fibrillation: Secondary | ICD-10-CM | POA: Diagnosis not present

## 2022-03-10 DIAGNOSIS — G8929 Other chronic pain: Secondary | ICD-10-CM | POA: Diagnosis not present

## 2022-03-10 DIAGNOSIS — N1831 Chronic kidney disease, stage 3a: Secondary | ICD-10-CM | POA: Diagnosis not present

## 2022-03-18 DIAGNOSIS — E118 Type 2 diabetes mellitus with unspecified complications: Secondary | ICD-10-CM | POA: Diagnosis not present

## 2022-03-25 DIAGNOSIS — E1142 Type 2 diabetes mellitus with diabetic polyneuropathy: Secondary | ICD-10-CM | POA: Diagnosis not present

## 2022-03-25 DIAGNOSIS — B351 Tinea unguium: Secondary | ICD-10-CM | POA: Diagnosis not present

## 2022-04-03 DIAGNOSIS — E870 Hyperosmolality and hypernatremia: Secondary | ICD-10-CM | POA: Diagnosis not present

## 2022-04-07 ENCOUNTER — Other Ambulatory Visit: Payer: Self-pay | Admitting: Family Medicine

## 2022-04-07 ENCOUNTER — Other Ambulatory Visit: Payer: Self-pay | Admitting: Physician Assistant

## 2022-04-07 DIAGNOSIS — M1990 Unspecified osteoarthritis, unspecified site: Secondary | ICD-10-CM

## 2022-04-08 DIAGNOSIS — E871 Hypo-osmolality and hyponatremia: Secondary | ICD-10-CM | POA: Diagnosis not present

## 2022-04-08 DIAGNOSIS — G8929 Other chronic pain: Secondary | ICD-10-CM | POA: Diagnosis not present

## 2022-04-08 DIAGNOSIS — M25561 Pain in right knee: Secondary | ICD-10-CM | POA: Diagnosis not present

## 2022-04-08 NOTE — Telephone Encounter (Signed)
Patient established care with another practice to continue care with Dr. Rosanna Randy. Will refuse request.  Requested Prescriptions  Pending Prescriptions Disp Refills   FERROCITE 324 MG TABS tablet [Pharmacy Med Name: FERROCITE 324MG TABLETS] 90 tablet 7    Sig: TAKE 1 TABLET BY [place] MORNING     Endocrinology:  Minerals - Iron Supplementation Passed - 04/07/2022  1:02 PM      Passed - HGB in normal range and within 360 days    Hemoglobin  Date Value Ref Range Status  10/13/2021 12.5 11.1 - 15.9 g/dL Final         Passed - HCT in normal range and within 360 days    Hematocrit  Date Value Ref Range Status  10/13/2021 38.4 34.0 - 46.6 % Final         Passed - RBC in normal range and within 360 days    RBC  Date Value Ref Range Status  10/13/2021 4.02 3.77 - 5.28 x10E6/uL Final  05/19/2021 3.77 (L) 3.87 - 5.11 MIL/uL Final         Passed - Fe (serum) in normal range and within 360 days    Iron  Date Value Ref Range Status  05/08/2021 49 27 - 139 ug/dL Final   Iron Saturation  Date Value Ref Range Status  05/08/2021 15 15 - 55 % Final         Passed - Ferritin in normal range and within 360 days    Ferritin  Date Value Ref Range Status  05/08/2021 43 15 - 150 ng/mL Final         Passed - Valid encounter within last 12 months    Recent Outpatient Visits           5 months ago Weight loss   [facility] Eulas Post, MD   7 months ago Type 2 diabetes mellitus without complication, without long-term current use of insulin [facility])   [place] Eulas Post, MD   11 months ago Gastrointestinal hemorrhage, unspecified gastrointestinal hemorrhage type   [facility] Eulas Post, MD   11 months ago [place] [name], PA-C   12 months ago Laceration of scalp, subsequent encounter   [place] Eulas Post, MD

## 2022-04-09 DIAGNOSIS — E871 Hypo-osmolality and hyponatremia: Secondary | ICD-10-CM | POA: Diagnosis not present

## 2022-04-16 DIAGNOSIS — E871 Hypo-osmolality and hyponatremia: Secondary | ICD-10-CM | POA: Diagnosis not present

## 2022-04-16 DIAGNOSIS — E119 Type 2 diabetes mellitus without complications: Secondary | ICD-10-CM | POA: Diagnosis not present

## 2022-04-16 DIAGNOSIS — K219 Gastro-esophageal reflux disease without esophagitis: Secondary | ICD-10-CM | POA: Diagnosis not present

## 2022-04-16 DIAGNOSIS — M1711 Unilateral primary osteoarthritis, right knee: Secondary | ICD-10-CM | POA: Diagnosis not present

## 2022-04-16 DIAGNOSIS — D509 Iron deficiency anemia, unspecified: Secondary | ICD-10-CM | POA: Diagnosis not present

## 2022-06-01 ENCOUNTER — Other Ambulatory Visit: Payer: Self-pay | Admitting: Physician Assistant

## 2022-06-01 DIAGNOSIS — E7800 Pure hypercholesterolemia, unspecified: Secondary | ICD-10-CM

## 2022-06-01 DIAGNOSIS — F419 Anxiety disorder, unspecified: Secondary | ICD-10-CM

## 2022-06-23 ENCOUNTER — Other Ambulatory Visit: Payer: Self-pay | Admitting: Physician Assistant

## 2022-06-24 DIAGNOSIS — E1142 Type 2 diabetes mellitus with diabetic polyneuropathy: Secondary | ICD-10-CM | POA: Diagnosis not present

## 2022-06-24 DIAGNOSIS — B351 Tinea unguium: Secondary | ICD-10-CM | POA: Diagnosis not present

## 2022-07-10 DIAGNOSIS — I480 Paroxysmal atrial fibrillation: Secondary | ICD-10-CM | POA: Diagnosis not present

## 2022-07-10 DIAGNOSIS — D509 Iron deficiency anemia, unspecified: Secondary | ICD-10-CM | POA: Diagnosis not present

## 2022-07-10 DIAGNOSIS — E1122 Type 2 diabetes mellitus with diabetic chronic kidney disease: Secondary | ICD-10-CM | POA: Diagnosis not present

## 2022-07-10 DIAGNOSIS — J4520 Mild intermittent asthma, uncomplicated: Secondary | ICD-10-CM | POA: Diagnosis not present

## 2022-07-10 DIAGNOSIS — E871 Hypo-osmolality and hyponatremia: Secondary | ICD-10-CM | POA: Diagnosis not present

## 2022-07-10 DIAGNOSIS — D508 Other iron deficiency anemias: Secondary | ICD-10-CM | POA: Diagnosis not present

## 2022-07-10 DIAGNOSIS — N1831 Chronic kidney disease, stage 3a: Secondary | ICD-10-CM | POA: Diagnosis not present

## 2022-07-10 DIAGNOSIS — E1142 Type 2 diabetes mellitus with diabetic polyneuropathy: Secondary | ICD-10-CM | POA: Diagnosis not present

## 2022-08-31 ENCOUNTER — Other Ambulatory Visit: Payer: Self-pay | Admitting: Family Medicine

## 2022-08-31 DIAGNOSIS — I10 Essential (primary) hypertension: Secondary | ICD-10-CM

## 2022-09-07 ENCOUNTER — Other Ambulatory Visit: Payer: Self-pay | Admitting: Physician Assistant

## 2022-09-07 DIAGNOSIS — E7800 Pure hypercholesterolemia, unspecified: Secondary | ICD-10-CM

## 2022-09-28 DIAGNOSIS — E1142 Type 2 diabetes mellitus with diabetic polyneuropathy: Secondary | ICD-10-CM | POA: Diagnosis not present

## 2022-09-28 DIAGNOSIS — B351 Tinea unguium: Secondary | ICD-10-CM | POA: Diagnosis not present

## 2022-11-11 DIAGNOSIS — H43813 Vitreous degeneration, bilateral: Secondary | ICD-10-CM | POA: Diagnosis not present

## 2022-11-11 DIAGNOSIS — E119 Type 2 diabetes mellitus without complications: Secondary | ICD-10-CM | POA: Diagnosis not present

## 2022-11-12 DIAGNOSIS — Z23 Encounter for immunization: Secondary | ICD-10-CM | POA: Diagnosis not present

## 2022-11-12 DIAGNOSIS — I10 Essential (primary) hypertension: Secondary | ICD-10-CM | POA: Diagnosis not present

## 2022-11-12 DIAGNOSIS — N1831 Chronic kidney disease, stage 3a: Secondary | ICD-10-CM | POA: Diagnosis not present

## 2022-11-12 DIAGNOSIS — E1122 Type 2 diabetes mellitus with diabetic chronic kidney disease: Secondary | ICD-10-CM | POA: Diagnosis not present

## 2022-11-12 DIAGNOSIS — E871 Hypo-osmolality and hyponatremia: Secondary | ICD-10-CM | POA: Diagnosis not present

## 2022-11-12 DIAGNOSIS — I129 Hypertensive chronic kidney disease with stage 1 through stage 4 chronic kidney disease, or unspecified chronic kidney disease: Secondary | ICD-10-CM | POA: Diagnosis not present

## 2022-11-12 DIAGNOSIS — E1142 Type 2 diabetes mellitus with diabetic polyneuropathy: Secondary | ICD-10-CM | POA: Diagnosis not present

## 2022-11-12 DIAGNOSIS — I4891 Unspecified atrial fibrillation: Secondary | ICD-10-CM | POA: Diagnosis not present

## 2022-11-24 DIAGNOSIS — I495 Sick sinus syndrome: Secondary | ICD-10-CM | POA: Diagnosis not present

## 2022-12-02 DIAGNOSIS — E782 Mixed hyperlipidemia: Secondary | ICD-10-CM | POA: Diagnosis not present

## 2022-12-02 DIAGNOSIS — I10 Essential (primary) hypertension: Secondary | ICD-10-CM | POA: Diagnosis not present

## 2022-12-02 DIAGNOSIS — I340 Nonrheumatic mitral (valve) insufficiency: Secondary | ICD-10-CM | POA: Diagnosis not present

## 2022-12-02 DIAGNOSIS — I480 Paroxysmal atrial fibrillation: Secondary | ICD-10-CM | POA: Diagnosis not present

## 2022-12-02 DIAGNOSIS — I495 Sick sinus syndrome: Secondary | ICD-10-CM | POA: Diagnosis not present

## 2022-12-02 DIAGNOSIS — Z950 Presence of cardiac pacemaker: Secondary | ICD-10-CM | POA: Diagnosis not present

## 2022-12-25 DIAGNOSIS — D2262 Melanocytic nevi of left upper limb, including shoulder: Secondary | ICD-10-CM | POA: Diagnosis not present

## 2022-12-25 DIAGNOSIS — D485 Neoplasm of uncertain behavior of skin: Secondary | ICD-10-CM | POA: Diagnosis not present

## 2022-12-25 DIAGNOSIS — L821 Other seborrheic keratosis: Secondary | ICD-10-CM | POA: Diagnosis not present

## 2022-12-25 DIAGNOSIS — D225 Melanocytic nevi of trunk: Secondary | ICD-10-CM | POA: Diagnosis not present

## 2022-12-25 DIAGNOSIS — D2271 Melanocytic nevi of right lower limb, including hip: Secondary | ICD-10-CM | POA: Diagnosis not present

## 2022-12-25 DIAGNOSIS — D2272 Melanocytic nevi of left lower limb, including hip: Secondary | ICD-10-CM | POA: Diagnosis not present

## 2022-12-25 DIAGNOSIS — D2261 Melanocytic nevi of right upper limb, including shoulder: Secondary | ICD-10-CM | POA: Diagnosis not present

## 2022-12-30 DIAGNOSIS — C44622 Squamous cell carcinoma of skin of right upper limb, including shoulder: Secondary | ICD-10-CM | POA: Diagnosis not present

## 2023-01-11 DIAGNOSIS — E1142 Type 2 diabetes mellitus with diabetic polyneuropathy: Secondary | ICD-10-CM | POA: Diagnosis not present

## 2023-01-11 DIAGNOSIS — B351 Tinea unguium: Secondary | ICD-10-CM | POA: Diagnosis not present

## 2023-08-16 ENCOUNTER — Other Ambulatory Visit: Payer: Self-pay | Admitting: Family Medicine

## 2023-08-16 DIAGNOSIS — I10 Essential (primary) hypertension: Secondary | ICD-10-CM

## 2023-09-02 IMAGING — CT CT HEAD W/O CM
4 series · 17 of 47 positions shown, 19 images · non-contrast
Comparison: None.

CLINICAL DATA: Trauma, fall, laceration to the right side of the
head

EXAM:
CT HEAD WITHOUT CONTRAST
CT CERVICAL SPINE WITHOUT CONTRAST
TECHNIQUE: Multidetector CT imaging of the head and cervical spine was
performed following the standard protocol without intravenous
contrast. Multiplanar CT image reconstructions of the cervical spine
were also generated.

[Series 2: head wo · axial · 0.45mm/px · z∈[-81,+39]mm · 7 of 33 slices shown, 9 images]
[im 5/33  brain]
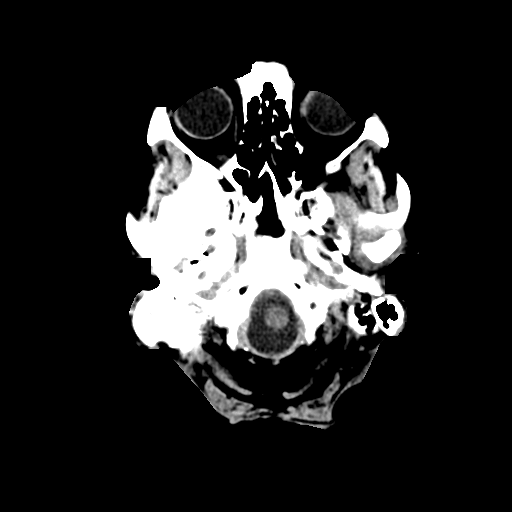
[im 5/33  bone]
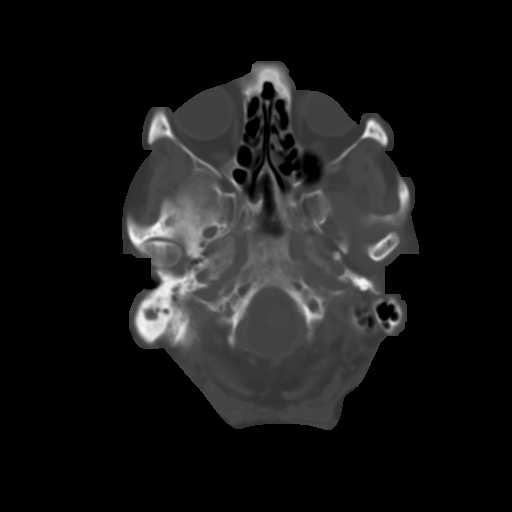
[im 9/33  brain]
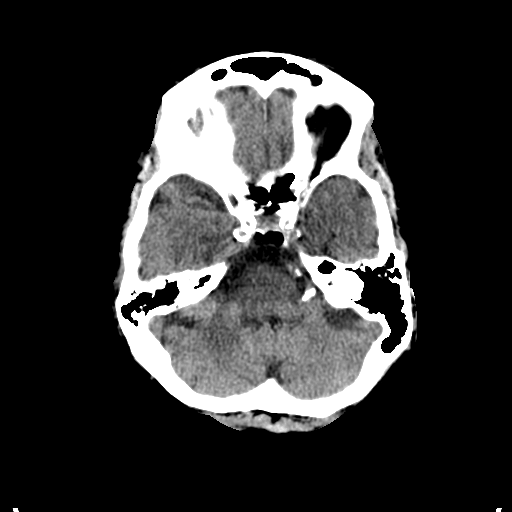
[im 13/33  brain]
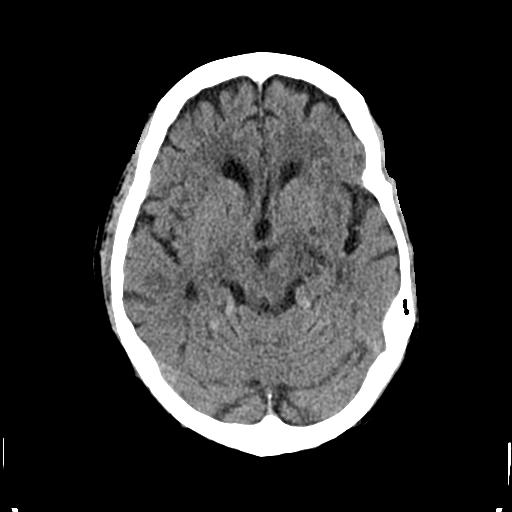
[im 17/33  brain]
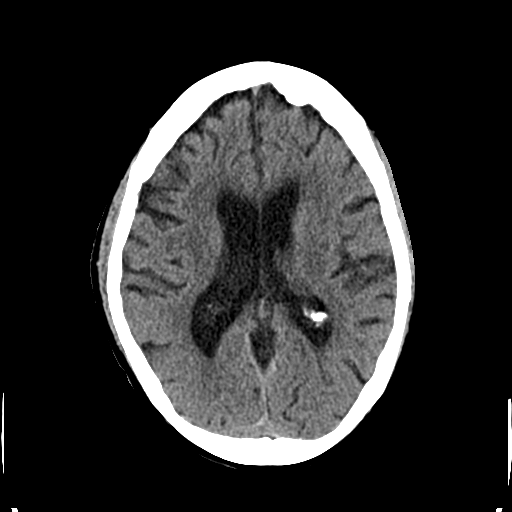
[im 21/33  brain]
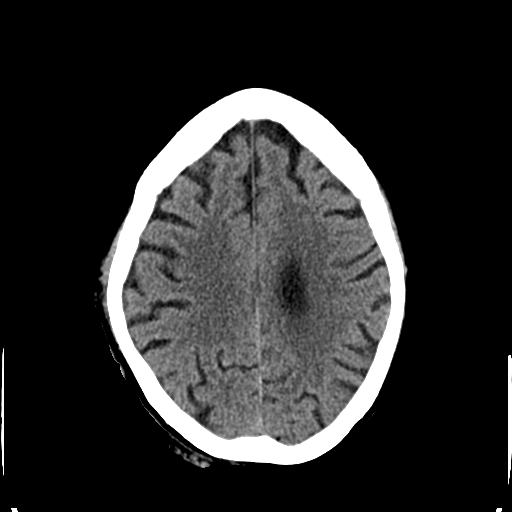
[im 21/33  bone]
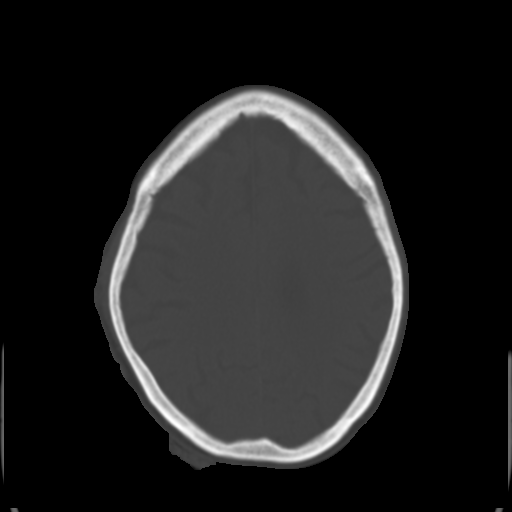
[im 25/33  brain]
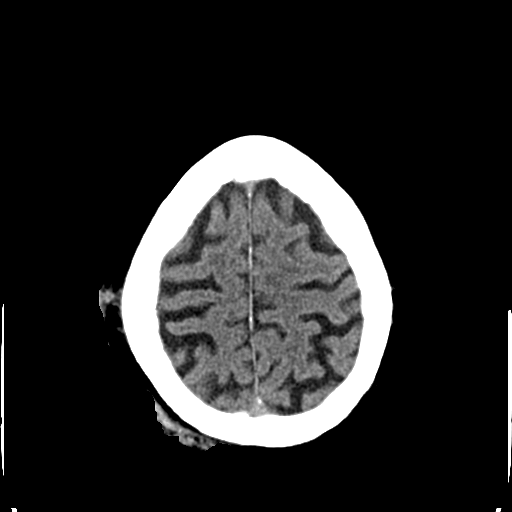
[im 29/33  brain]
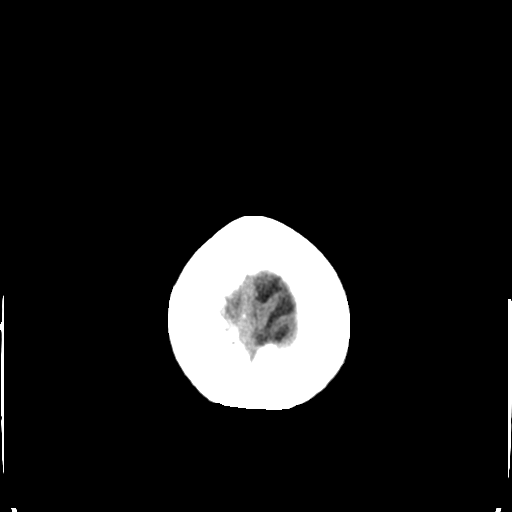

[Series 3: head bone · axial · 0.45mm/px · z∈[-85,-29]mm · 4 of 81 slices shown]
[im 9/81  bone]
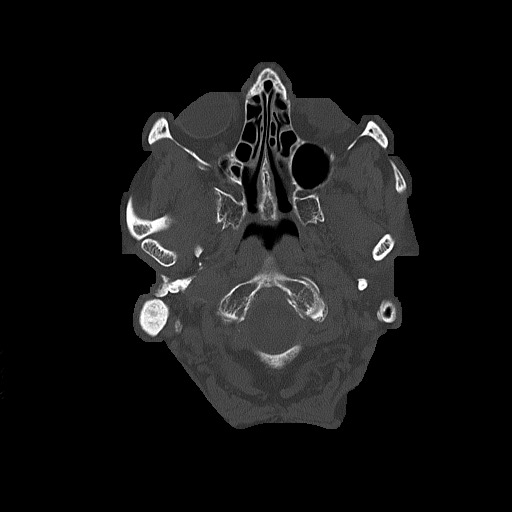
[im 17/81  bone]
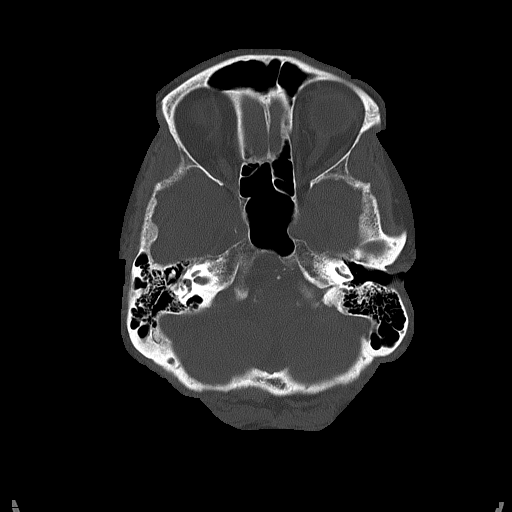
[im 25/81  bone]
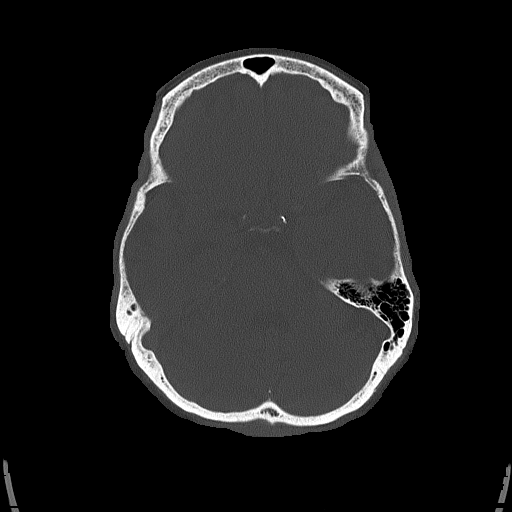
[im 37/81  bone]
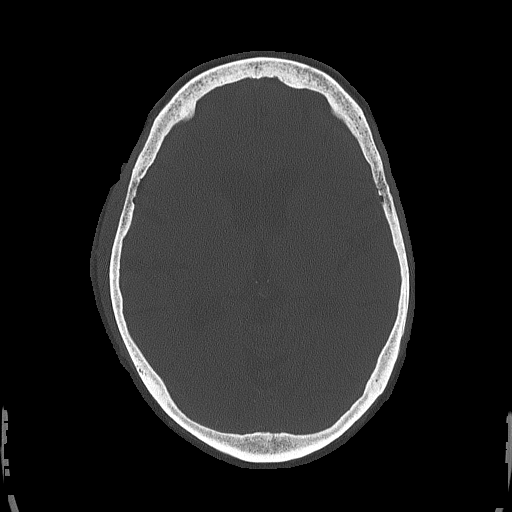

[Series 4: coronal soft tissue · coronal · 0.33mm/px · 3 of 69 slices shown]
[im 23/69  brain]
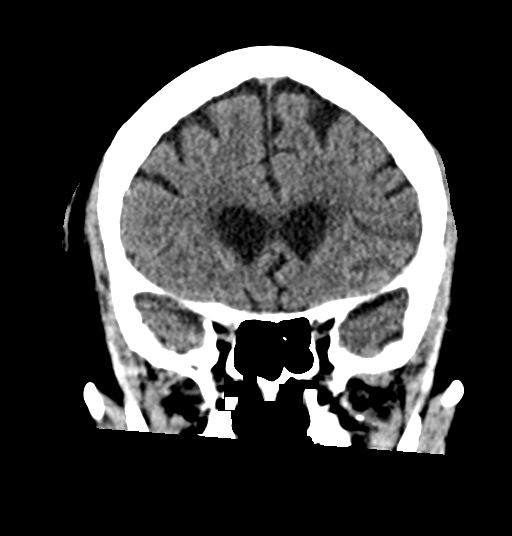
[im 31/69  brain]
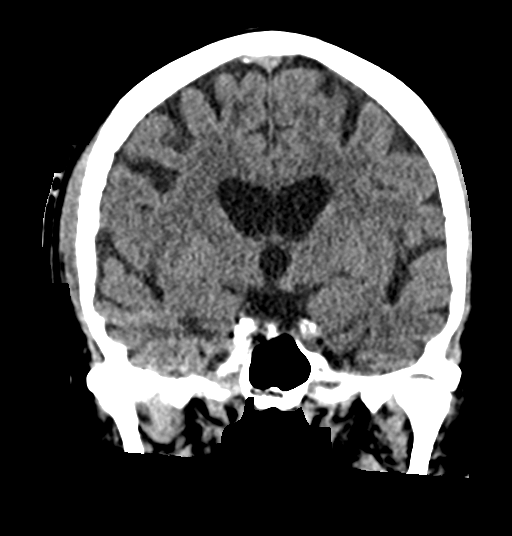
[im 38/69  brain]
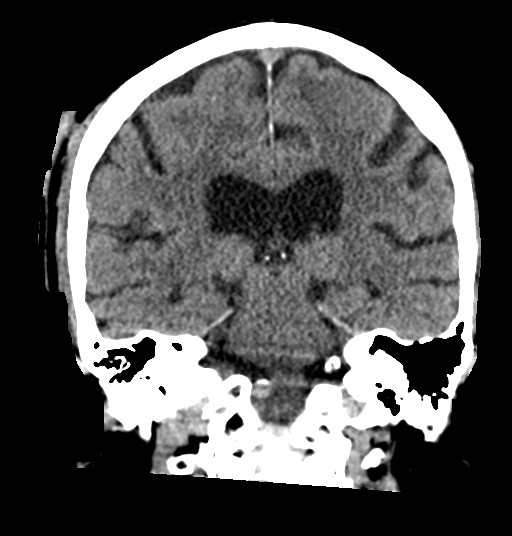

[Series 5: sagittal soft tissue · sagittal · 0.38mm/px · 3 of 56 slices shown]
[im 19/56  brain]
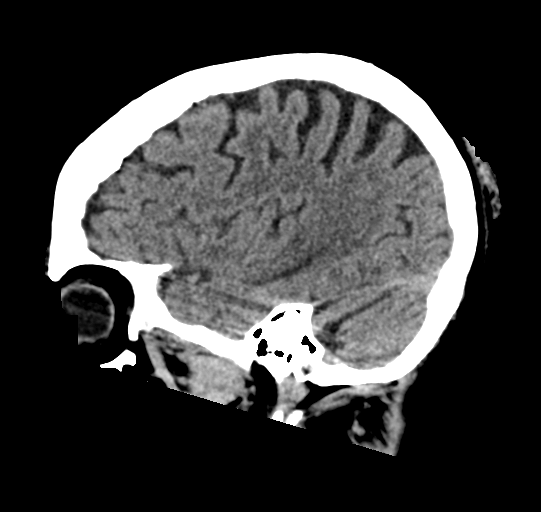
[im 28/56  brain]
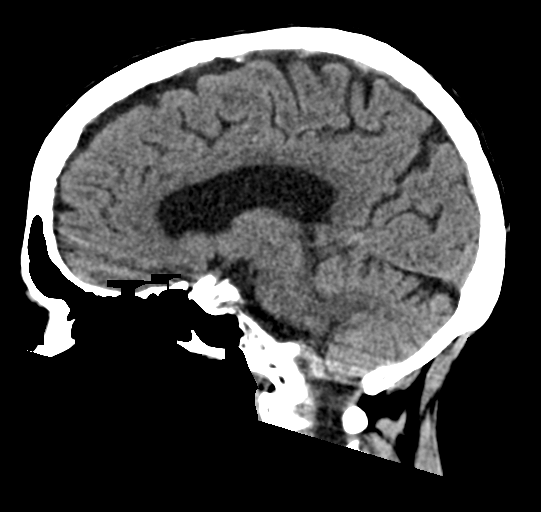
[im 37/56  brain]
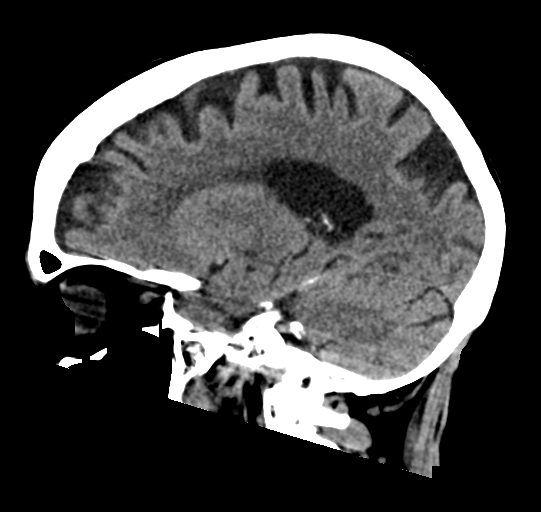

[17 of 47 positions shown; findings below may reference images not displayed]

FINDINGS: CT HEAD FINDINGS

Brain: No evidence of acute infarction, hemorrhage, hydrocephalus,
extra-axial collection or mass lesion/mass effect. Mild chronic
small-vessel ischemic changes and cerebral atrophy.

Vascular: No hyperdense vessel or unexpected calcification.

Skull: Right frontal laceration with soft tissue swelling. Soiled
gauze around the head.

Sinuses/Orbits: No acute finding.  Bilateral cataract surgery.

Other: None.

CT CERVICAL SPINE FINDINGS

Alignment: Straightening of the cervical spine.

Skull base and vertebrae: No acute fracture. No primary bone lesion
or focal pathologic process.

Soft tissues and spinal canal: No prevertebral fluid or swelling. No
visible canal hematoma.

Disc levels: Mild multilevel degenerative disc disease. No
significant spinal canal or neural foraminal narrowing.

Upper chest: Negative.

Other: None
IMPRESSION: 1.  No acute intracranial abnormality.

2. Right frontal/parietal soft tissue hematoma without evidence of
calvarial fracture.

3.  No evidence of fracture or subluxation of the cervical spine.

4.  Mild multilevel degenerative disc disease of the cervical spine.

## 2023-09-02 IMAGING — CT CT CERVICAL SPINE W/O CM
3 of 4 series · 12 of 35 positions shown, 14 images · non-contrast
Comparison: None.

CLINICAL DATA: Trauma, fall, laceration to the right side of the
head

EXAM:
CT HEAD WITHOUT CONTRAST
CT CERVICAL SPINE WITHOUT CONTRAST
TECHNIQUE: Multidetector CT imaging of the head and cervical spine was
performed following the standard protocol without intravenous
contrast. Multiplanar CT image reconstructions of the cervical spine
were also generated.

[Series 2: coronal bone · coronal · 0.26mm/px · 3 of 66 slices shown]
[im 18/66  bone]
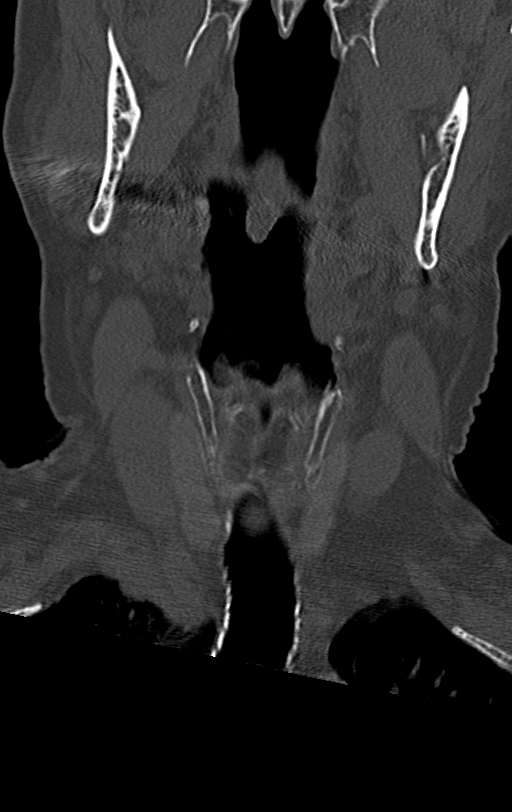
[im 28/66  bone]
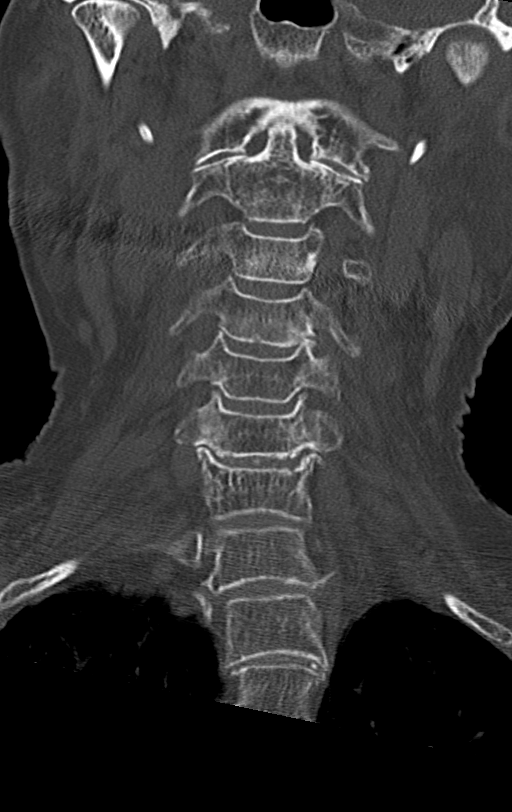
[im 38/66  bone]
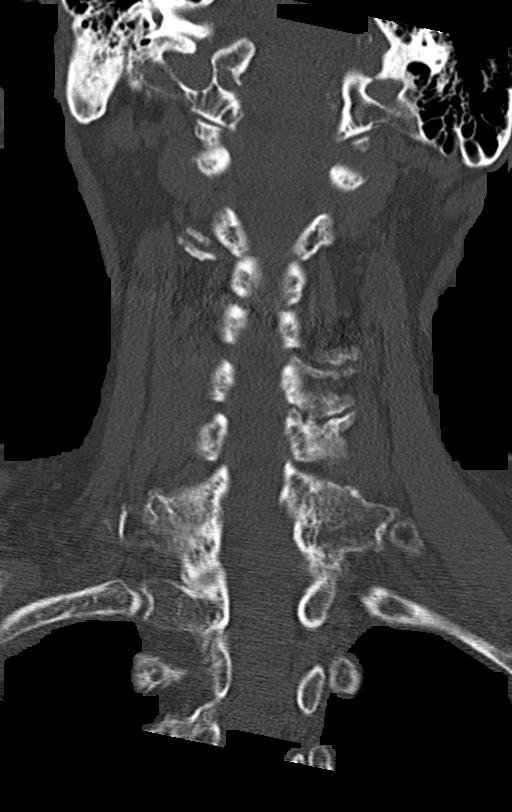

[Series 7: sagittal bone · sagittal · 0.28mm/px · 5 of 72 slices shown, 6 images]
[im 24/72  bone]
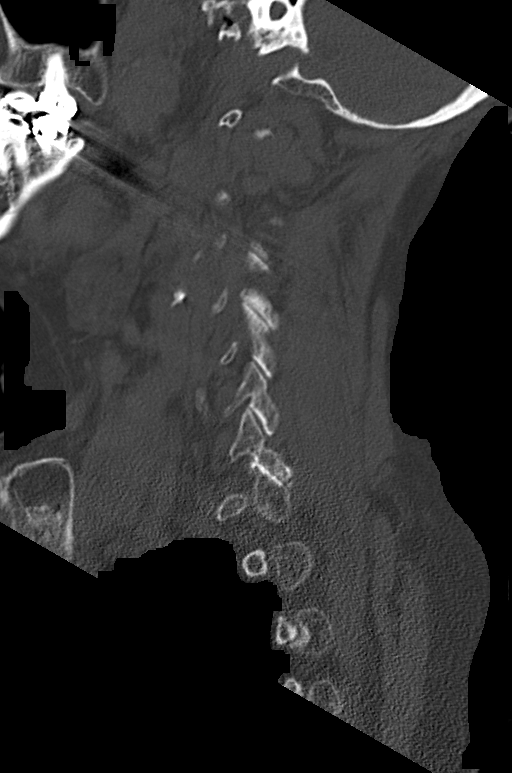
[im 30/72  bone]
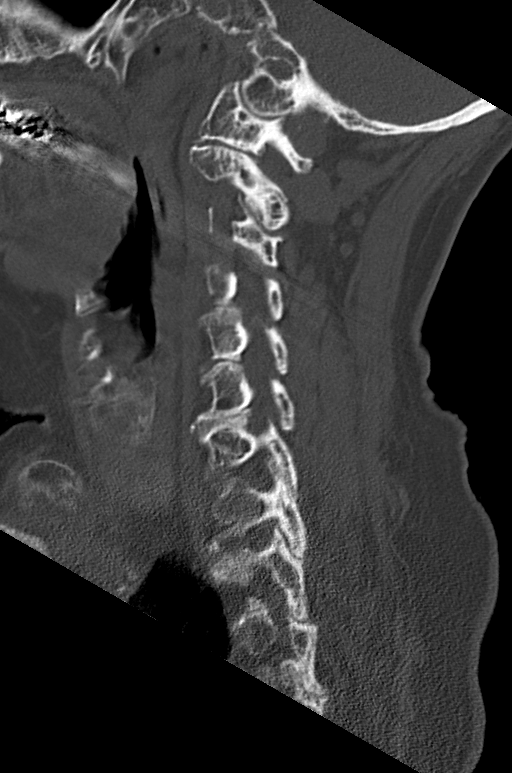
[im 36/72  soft-tissue]
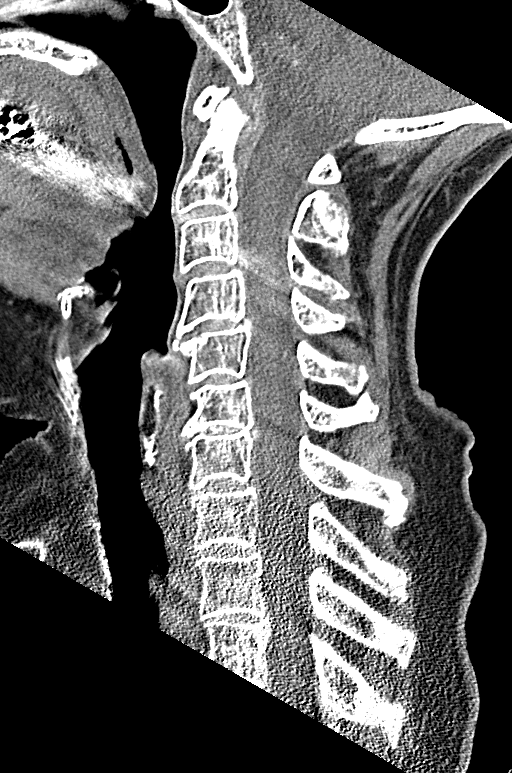
[im 36/72  bone]
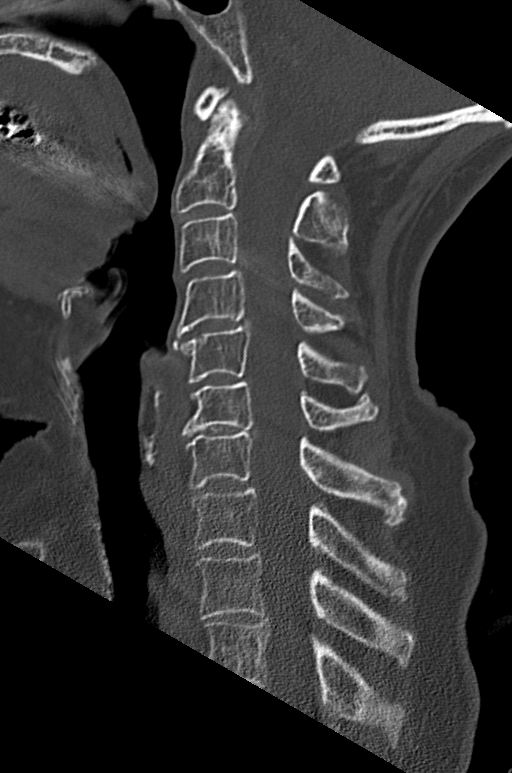
[im 42/72  bone]
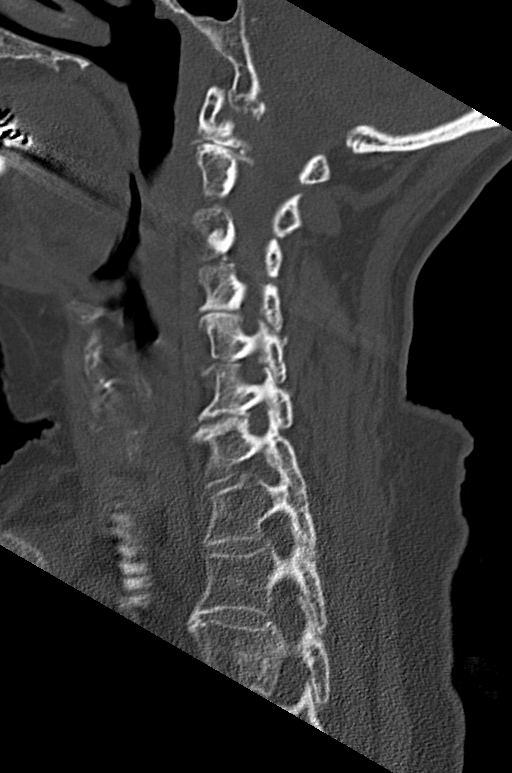
[im 48/72  bone]
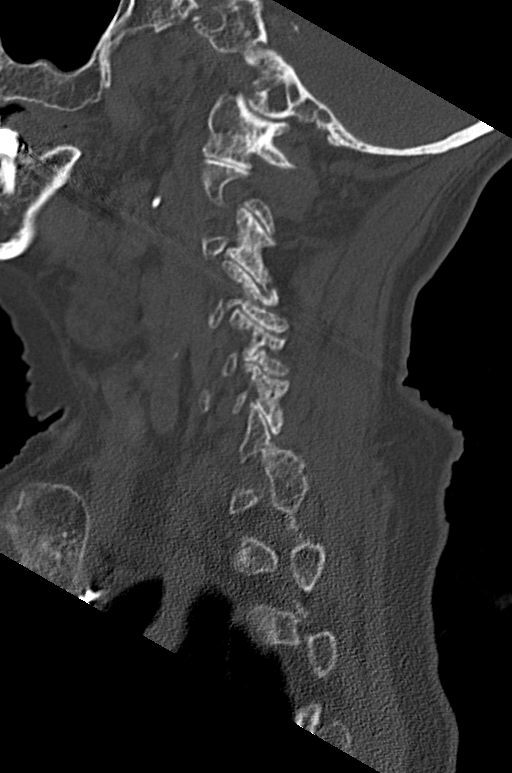

[Series 8: orthogonal bone · axial · 0.29mm/px · z∈[-300,-172]mm · 4 of 105 slices shown, 5 images]
[im 15/105  soft-tissue]
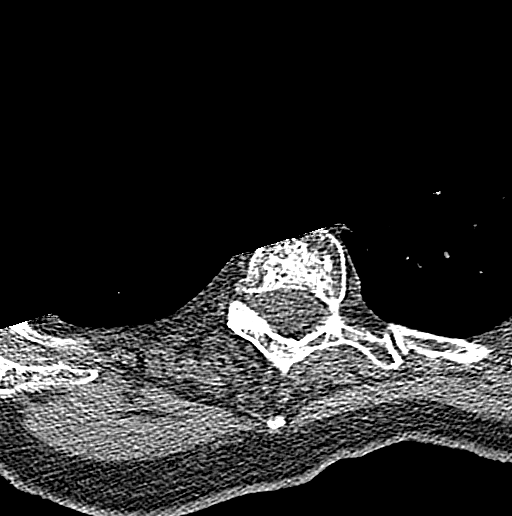
[im 15/105  bone]
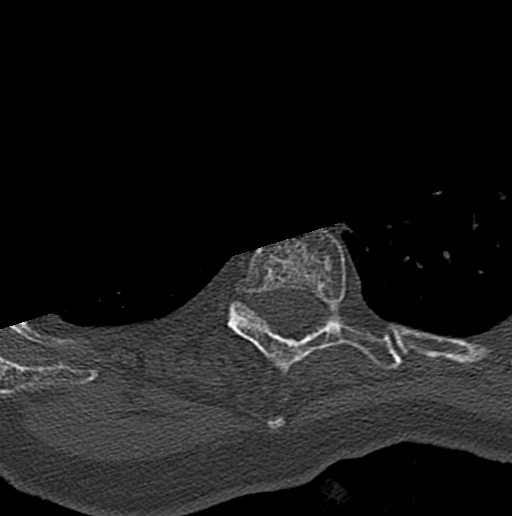
[im 45/105  bone]
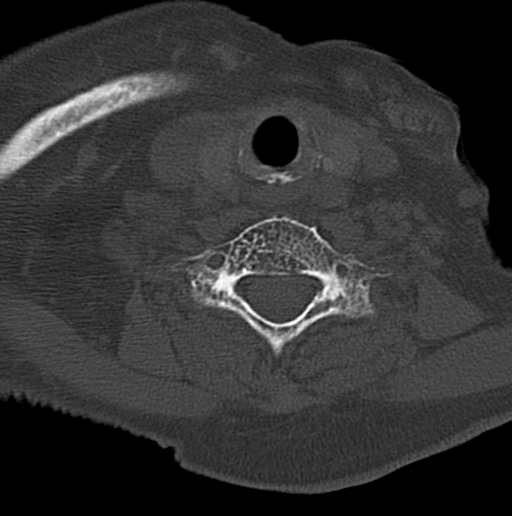
[im 60/105  bone]
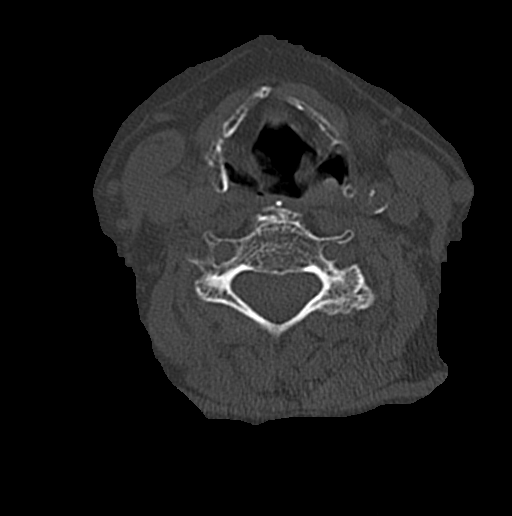
[im 90/105  bone]
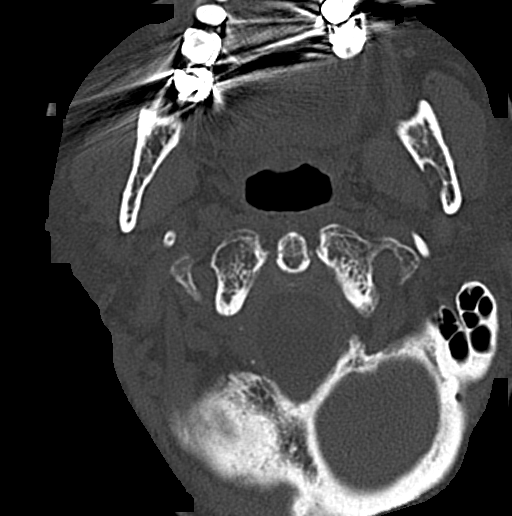

[12 of 35 positions shown; findings below may reference images not displayed]

FINDINGS: CT HEAD FINDINGS

Brain: No evidence of acute infarction, hemorrhage, hydrocephalus,
extra-axial collection or mass lesion/mass effect. Mild chronic
small-vessel ischemic changes and cerebral atrophy.

Vascular: No hyperdense vessel or unexpected calcification.

Skull: Right frontal laceration with soft tissue swelling. Soiled
gauze around the head.

Sinuses/Orbits: No acute finding.  Bilateral cataract surgery.

Other: None.

CT CERVICAL SPINE FINDINGS

Alignment: Straightening of the cervical spine.

Skull base and vertebrae: No acute fracture. No primary bone lesion
or focal pathologic process.

Soft tissues and spinal canal: No prevertebral fluid or swelling. No
visible canal hematoma.

Disc levels: Mild multilevel degenerative disc disease. No
significant spinal canal or neural foraminal narrowing.

Upper chest: Negative.

Other: None
IMPRESSION: 1.  No acute intracranial abnormality.

2. Right frontal/parietal soft tissue hematoma without evidence of
calvarial fracture.

3.  No evidence of fracture or subluxation of the cervical spine.

4.  Mild multilevel degenerative disc disease of the cervical spine.
